# Patient Record
Sex: Female | Born: 1935 | Race: White | Hispanic: No | Smoking: Never smoker
Health system: Southern US, Community
[De-identification: ages and names within clinical notes are randomized; demographics above are authoritative.]

## PROBLEM LIST (undated history)

## (undated) DIAGNOSIS — I10 Essential (primary) hypertension: Secondary | ICD-10-CM

## (undated) DIAGNOSIS — I491 Atrial premature depolarization: Secondary | ICD-10-CM

## (undated) DIAGNOSIS — E039 Hypothyroidism, unspecified: Secondary | ICD-10-CM

## (undated) DIAGNOSIS — K219 Gastro-esophageal reflux disease without esophagitis: Secondary | ICD-10-CM

## (undated) DIAGNOSIS — I739 Peripheral vascular disease, unspecified: Secondary | ICD-10-CM

## (undated) DIAGNOSIS — H269 Unspecified cataract: Secondary | ICD-10-CM

## (undated) DIAGNOSIS — F0390 Unspecified dementia without behavioral disturbance: Secondary | ICD-10-CM

## (undated) DIAGNOSIS — I509 Heart failure, unspecified: Secondary | ICD-10-CM

## (undated) DIAGNOSIS — M1990 Unspecified osteoarthritis, unspecified site: Secondary | ICD-10-CM

## (undated) DIAGNOSIS — E079 Disorder of thyroid, unspecified: Secondary | ICD-10-CM

## (undated) DIAGNOSIS — N289 Disorder of kidney and ureter, unspecified: Secondary | ICD-10-CM

## (undated) HISTORY — PX: EYE SURGERY: SHX253

## (undated) HISTORY — PX: ABDOMINAL HYSTERECTOMY: SHX81

## (undated) HISTORY — PX: ESOPHAGEAL DILATION: SHX303

## (undated) HISTORY — DX: Atrial premature depolarization: I49.1

---

## 2000-06-17 ENCOUNTER — Encounter: Payer: Self-pay | Admitting: Cardiology

## 2000-06-17 ENCOUNTER — Encounter: Admission: RE | Admit: 2000-06-17 | Discharge: 2000-06-17 | Payer: Self-pay | Admitting: Cardiology

## 2001-06-23 ENCOUNTER — Encounter: Payer: Self-pay | Admitting: Cardiology

## 2001-06-23 ENCOUNTER — Encounter: Admission: RE | Admit: 2001-06-23 | Discharge: 2001-06-23 | Payer: Self-pay | Admitting: Cardiology

## 2001-10-19 ENCOUNTER — Ambulatory Visit (HOSPITAL_COMMUNITY): Admission: RE | Admit: 2001-10-19 | Discharge: 2001-10-19 | Payer: Self-pay | Admitting: Gastroenterology

## 2001-10-19 ENCOUNTER — Encounter: Payer: Self-pay | Admitting: Gastroenterology

## 2002-12-04 ENCOUNTER — Ambulatory Visit (HOSPITAL_COMMUNITY): Admission: RE | Admit: 2002-12-04 | Discharge: 2002-12-04 | Payer: Self-pay | Admitting: Gastroenterology

## 2002-12-04 ENCOUNTER — Encounter: Payer: Self-pay | Admitting: Gastroenterology

## 2005-11-18 ENCOUNTER — Ambulatory Visit (HOSPITAL_COMMUNITY): Admission: RE | Admit: 2005-11-18 | Discharge: 2005-11-18 | Payer: Self-pay | Admitting: Gastroenterology

## 2008-10-09 ENCOUNTER — Encounter: Admission: RE | Admit: 2008-10-09 | Discharge: 2008-10-09 | Payer: Self-pay | Admitting: Cardiology

## 2008-12-25 ENCOUNTER — Inpatient Hospital Stay (HOSPITAL_COMMUNITY): Admission: EM | Admit: 2008-12-25 | Discharge: 2008-12-31 | Payer: Self-pay | Admitting: Emergency Medicine

## 2008-12-25 ENCOUNTER — Ambulatory Visit: Payer: Self-pay | Admitting: Vascular Surgery

## 2008-12-29 ENCOUNTER — Encounter (INDEPENDENT_AMBULATORY_CARE_PROVIDER_SITE_OTHER): Payer: Self-pay | Admitting: Gastroenterology

## 2009-02-11 ENCOUNTER — Ambulatory Visit: Payer: Self-pay | Admitting: Internal Medicine

## 2009-02-11 ENCOUNTER — Ambulatory Visit: Payer: Self-pay | Admitting: Diagnostic Radiology

## 2009-02-11 ENCOUNTER — Ambulatory Visit (HOSPITAL_BASED_OUTPATIENT_CLINIC_OR_DEPARTMENT_OTHER): Admission: RE | Admit: 2009-02-11 | Discharge: 2009-02-11 | Payer: Self-pay | Admitting: Internal Medicine

## 2009-02-11 DIAGNOSIS — F0390 Unspecified dementia without behavioral disturbance: Secondary | ICD-10-CM

## 2009-02-11 DIAGNOSIS — E039 Hypothyroidism, unspecified: Secondary | ICD-10-CM | POA: Insufficient documentation

## 2009-02-11 DIAGNOSIS — R269 Unspecified abnormalities of gait and mobility: Secondary | ICD-10-CM

## 2009-02-11 DIAGNOSIS — K219 Gastro-esophageal reflux disease without esophagitis: Secondary | ICD-10-CM

## 2009-02-11 DIAGNOSIS — K222 Esophageal obstruction: Secondary | ICD-10-CM | POA: Insufficient documentation

## 2009-02-11 LAB — CONVERTED CEMR LAB
Basophils Absolute: 0 10*3/uL (ref 0.0–0.1)
CO2: 26 meq/L (ref 19–32)
Calcium: 8.7 mg/dL (ref 8.4–10.5)
Creatinine, Ser: 1.2 mg/dL (ref 0.4–1.2)
Eosinophils Absolute: 0 10*3/uL (ref 0.0–0.7)
Glucose, Bld: 87 mg/dL (ref 70–99)
HCT: 31.2 % — ABNORMAL LOW (ref 36.0–46.0)
Hemoglobin: 10.5 g/dL — ABNORMAL LOW (ref 12.0–15.0)
Lymphs Abs: 0.8 10*3/uL (ref 0.7–4.0)
MCHC: 33.7 g/dL (ref 30.0–36.0)
Neutro Abs: 1.8 10*3/uL (ref 1.4–7.7)
RDW: 14.9 % — ABNORMAL HIGH (ref 11.5–14.6)

## 2009-02-12 ENCOUNTER — Encounter: Payer: Self-pay | Admitting: Internal Medicine

## 2009-02-12 ENCOUNTER — Telehealth: Payer: Self-pay | Admitting: Internal Medicine

## 2009-02-20 ENCOUNTER — Telehealth: Payer: Self-pay | Admitting: Internal Medicine

## 2009-02-28 ENCOUNTER — Encounter: Payer: Self-pay | Admitting: Internal Medicine

## 2009-03-05 ENCOUNTER — Ambulatory Visit (HOSPITAL_COMMUNITY): Admission: RE | Admit: 2009-03-05 | Discharge: 2009-03-05 | Payer: Self-pay | Admitting: Unknown Physician Specialty

## 2009-03-11 ENCOUNTER — Telehealth: Payer: Self-pay | Admitting: Internal Medicine

## 2009-03-13 ENCOUNTER — Telehealth: Payer: Self-pay | Admitting: Internal Medicine

## 2009-03-14 ENCOUNTER — Encounter: Payer: Self-pay | Admitting: Internal Medicine

## 2009-03-15 ENCOUNTER — Ambulatory Visit: Payer: Self-pay | Admitting: Internal Medicine

## 2009-03-15 DIAGNOSIS — R32 Unspecified urinary incontinence: Secondary | ICD-10-CM | POA: Insufficient documentation

## 2009-03-15 DIAGNOSIS — D510 Vitamin B12 deficiency anemia due to intrinsic factor deficiency: Secondary | ICD-10-CM

## 2009-03-15 DIAGNOSIS — E538 Deficiency of other specified B group vitamins: Secondary | ICD-10-CM

## 2009-03-20 ENCOUNTER — Encounter: Payer: Self-pay | Admitting: Internal Medicine

## 2009-03-29 ENCOUNTER — Telehealth (INDEPENDENT_AMBULATORY_CARE_PROVIDER_SITE_OTHER): Payer: Self-pay | Admitting: *Deleted

## 2009-03-29 DIAGNOSIS — M48061 Spinal stenosis, lumbar region without neurogenic claudication: Secondary | ICD-10-CM

## 2009-04-08 ENCOUNTER — Encounter: Payer: Self-pay | Admitting: Internal Medicine

## 2009-04-14 ENCOUNTER — Encounter: Payer: Self-pay | Admitting: Internal Medicine

## 2009-04-14 ENCOUNTER — Inpatient Hospital Stay (HOSPITAL_COMMUNITY): Admission: EM | Admit: 2009-04-14 | Discharge: 2009-04-18 | Payer: Self-pay | Admitting: Emergency Medicine

## 2009-04-14 ENCOUNTER — Ambulatory Visit: Payer: Self-pay | Admitting: Internal Medicine

## 2009-04-14 DIAGNOSIS — N189 Chronic kidney disease, unspecified: Secondary | ICD-10-CM

## 2009-04-14 DIAGNOSIS — M810 Age-related osteoporosis without current pathological fracture: Secondary | ICD-10-CM | POA: Insufficient documentation

## 2009-04-14 DIAGNOSIS — N390 Urinary tract infection, site not specified: Secondary | ICD-10-CM | POA: Insufficient documentation

## 2009-04-14 DIAGNOSIS — R1319 Other dysphagia: Secondary | ICD-10-CM

## 2009-04-19 ENCOUNTER — Encounter: Payer: Self-pay | Admitting: Internal Medicine

## 2009-05-03 ENCOUNTER — Encounter: Payer: Self-pay | Admitting: Internal Medicine

## 2009-05-16 ENCOUNTER — Telehealth: Payer: Self-pay | Admitting: Internal Medicine

## 2009-05-23 ENCOUNTER — Encounter: Payer: Self-pay | Admitting: Internal Medicine

## 2009-05-23 ENCOUNTER — Telehealth: Payer: Self-pay | Admitting: Internal Medicine

## 2009-05-30 ENCOUNTER — Telehealth: Payer: Self-pay | Admitting: Internal Medicine

## 2009-05-31 ENCOUNTER — Encounter: Payer: Self-pay | Admitting: Internal Medicine

## 2009-06-03 ENCOUNTER — Telehealth: Payer: Self-pay | Admitting: Internal Medicine

## 2009-06-17 ENCOUNTER — Ambulatory Visit: Payer: Self-pay | Admitting: Internal Medicine

## 2009-06-17 DIAGNOSIS — I10 Essential (primary) hypertension: Secondary | ICD-10-CM

## 2009-06-17 LAB — CONVERTED CEMR LAB
Basophils Absolute: 0 10*3/uL (ref 0.0–0.1)
Basophils Relative: 0 % (ref 0–1)
Calcium: 8.6 mg/dL (ref 8.4–10.5)
Creatinine, Ser: 1.49 mg/dL — ABNORMAL HIGH (ref 0.40–1.20)
Glucose, Bld: 85 mg/dL (ref 70–99)
Hemoglobin: 11.8 g/dL — ABNORMAL LOW (ref 12.0–15.0)
MCHC: 31.4 g/dL (ref 30.0–36.0)
Monocytes Absolute: 0.6 10*3/uL (ref 0.1–1.0)
Neutro Abs: 2.4 10*3/uL (ref 1.7–7.7)
Neutrophils Relative %: 59 % (ref 43–77)
RDW: 15.1 % (ref 11.5–15.5)
Sodium: 140 meq/L (ref 135–145)
TSH: 40.357 microintl units/mL — ABNORMAL HIGH (ref 0.350–4.500)

## 2009-06-18 ENCOUNTER — Telehealth: Payer: Self-pay | Admitting: Internal Medicine

## 2009-06-21 ENCOUNTER — Telehealth: Payer: Self-pay | Admitting: Internal Medicine

## 2009-06-28 ENCOUNTER — Encounter: Payer: Self-pay | Admitting: Internal Medicine

## 2009-06-28 LAB — CONVERTED CEMR LAB: Free T4: 0.59 ng/dL — ABNORMAL LOW (ref 0.80–1.80)

## 2009-06-30 ENCOUNTER — Telehealth: Payer: Self-pay | Admitting: Internal Medicine

## 2009-07-11 ENCOUNTER — Encounter: Payer: Self-pay | Admitting: Internal Medicine

## 2009-07-16 ENCOUNTER — Telehealth: Payer: Self-pay | Admitting: Internal Medicine

## 2009-07-24 ENCOUNTER — Encounter: Payer: Self-pay | Admitting: Internal Medicine

## 2009-07-31 ENCOUNTER — Encounter: Payer: Self-pay | Admitting: Internal Medicine

## 2009-08-01 ENCOUNTER — Telehealth: Payer: Self-pay | Admitting: Internal Medicine

## 2009-08-12 ENCOUNTER — Telehealth: Payer: Self-pay | Admitting: Internal Medicine

## 2009-08-13 ENCOUNTER — Ambulatory Visit: Payer: Self-pay | Admitting: Diagnostic Radiology

## 2009-08-13 ENCOUNTER — Telehealth: Payer: Self-pay | Admitting: Internal Medicine

## 2009-08-13 ENCOUNTER — Ambulatory Visit: Payer: Self-pay | Admitting: Internal Medicine

## 2009-08-13 ENCOUNTER — Ambulatory Visit (HOSPITAL_BASED_OUTPATIENT_CLINIC_OR_DEPARTMENT_OTHER): Admission: RE | Admit: 2009-08-13 | Discharge: 2009-08-13 | Payer: Self-pay | Admitting: Internal Medicine

## 2009-08-13 DIAGNOSIS — R079 Chest pain, unspecified: Secondary | ICD-10-CM

## 2009-08-28 ENCOUNTER — Encounter: Payer: Self-pay | Admitting: Internal Medicine

## 2009-09-26 ENCOUNTER — Telehealth: Payer: Self-pay | Admitting: Internal Medicine

## 2009-10-04 ENCOUNTER — Encounter: Payer: Self-pay | Admitting: Internal Medicine

## 2009-12-10 ENCOUNTER — Encounter: Payer: Self-pay | Admitting: Internal Medicine

## 2010-04-14 ENCOUNTER — Encounter: Payer: Self-pay | Admitting: Internal Medicine

## 2010-08-22 ENCOUNTER — Encounter: Admission: RE | Admit: 2010-08-22 | Discharge: 2010-08-22 | Payer: Self-pay | Admitting: Cardiology

## 2010-08-22 ENCOUNTER — Ambulatory Visit: Payer: Self-pay | Admitting: Cardiology

## 2010-10-16 ENCOUNTER — Other Ambulatory Visit (HOSPITAL_COMMUNITY): Payer: Self-pay | Admitting: Internal Medicine

## 2010-10-16 DIAGNOSIS — I10 Essential (primary) hypertension: Secondary | ICD-10-CM

## 2010-10-21 NOTE — Progress Notes (Signed)
Summary: Medical Care Question   Phone Note Call from Patient   Caller: Son Call For: D. Thomos Lemons DO Details for Reason: Medical Care Question- Summary of Call: Son- Emily Peters called to ask if we will still see his mother as a pt. now that she has medicaid? Call Mr. Emily Peters back at [phone] She is already a pt. of Dr.Avalene Sealy and wants to continue her care here.  Initial call taken by: Michaelle Copas,  September 26, 2009 11:25 AM  Follow-up for Phone Call        yes, she can continue as pt Follow-up by: D. Thomos Lemons DO,  September 26, 2009 12:09 PM  Additional Follow-up for Phone Call Additional follow up Details #1::        informed pt's son- Emily Peters that pt. can continue seing Dr.Avannah Decker as her PCP Additional Follow-up by: Michaelle Copas,  September 26, 2009 12:14 PM

## 2010-10-21 NOTE — Miscellaneous (Signed)
Summary: Care Plan/[facility]  Care Plan/[facility]   Imported By: Lanelle Bal 10/09/2009 11:44:33  _____________________________________________________________________  External Attachment:    Type:   Image     Comment:   External Document

## 2010-10-21 NOTE — Miscellaneous (Signed)
Summary: SN Orders/[facility]  SN Orders/[facility]   Imported By: Lanelle Bal 04/17/2010 11:33:59  _____________________________________________________________________  External Attachment:    Type:   Image     Comment:   External Document

## 2010-10-21 NOTE — Miscellaneous (Signed)
Summary: Care Plan/[facility]  Care Plan/[facility]   Imported By: Lanelle Bal 12/13/2009 10:35:51  _____________________________________________________________________  External Attachment:    Type:   Image     Comment:   External Document

## 2010-11-07 ENCOUNTER — Ambulatory Visit (HOSPITAL_COMMUNITY)
Admission: RE | Admit: 2010-11-07 | Discharge: 2010-11-07 | Disposition: A | Discharge status: Home / Self Care | Payer: Medicaid Other | Source: Ambulatory Visit | Attending: Internal Medicine | Admitting: Internal Medicine

## 2010-11-07 ENCOUNTER — Other Ambulatory Visit (HOSPITAL_COMMUNITY): Payer: Self-pay

## 2010-11-07 DIAGNOSIS — I10 Essential (primary) hypertension: Secondary | ICD-10-CM

## 2010-11-07 DIAGNOSIS — Q619 Cystic kidney disease, unspecified: Secondary | ICD-10-CM | POA: Insufficient documentation

## 2010-12-28 LAB — BASIC METABOLIC PANEL
BUN: 23 mg/dL (ref 6–23)
BUN: 27 mg/dL — ABNORMAL HIGH (ref 6–23)
CO2: 20 mEq/L (ref 19–32)
CO2: 22 mEq/L (ref 19–32)
CO2: 22 mEq/L (ref 19–32)
Calcium: 7.6 mg/dL — ABNORMAL LOW (ref 8.4–10.5)
Calcium: 7.9 mg/dL — ABNORMAL LOW (ref 8.4–10.5)
Calcium: 8.1 mg/dL — ABNORMAL LOW (ref 8.4–10.5)
Chloride: 109 mEq/L (ref 96–112)
Creatinine, Ser: 1.59 mg/dL — ABNORMAL HIGH (ref 0.4–1.2)
Creatinine, Ser: 1.84 mg/dL — ABNORMAL HIGH (ref 0.4–1.2)
Creatinine, Ser: 2.04 mg/dL — ABNORMAL HIGH (ref 0.4–1.2)
GFR calc Af Amer: 28 mL/min — ABNORMAL LOW (ref >60)
GFR calc non Af Amer: 31 mL/min — ABNORMAL LOW (ref >60)
Glucose, Bld: 103 mg/dL — ABNORMAL HIGH (ref 70–99)
Glucose, Bld: 128 mg/dL — ABNORMAL HIGH (ref 70–99)
Glucose, Bld: 90 mg/dL (ref 70–99)
Glucose, Bld: 99 mg/dL (ref 70–99)
Sodium: 135 mEq/L (ref 135–145)
Sodium: 136 mEq/L (ref 135–145)

## 2010-12-28 LAB — COMPREHENSIVE METABOLIC PANEL
ALT: 10 U/L (ref 0–35)
AST: 27 U/L (ref 0–37)
Albumin: 2.6 g/dL — ABNORMAL LOW (ref 3.5–5.2)
Alkaline Phosphatase: 65 U/L (ref 39–117)
BUN: 31 mg/dL — ABNORMAL HIGH (ref 6–23)
CO2: 25 mEq/L (ref 19–32)
Calcium: 8.8 mg/dL (ref 8.4–10.5)
Glucose, Bld: 101 mg/dL — ABNORMAL HIGH (ref 70–99)
Total Bilirubin: 0.8 mg/dL (ref 0.3–1.2)
Total Protein: 8.4 g/dL — ABNORMAL HIGH (ref 6.0–8.3)

## 2010-12-28 LAB — CBC
HCT: 38.3 % (ref 36.0–46.0)
Hemoglobin: 10.5 g/dL — ABNORMAL LOW (ref 12.0–15.0)
Hemoglobin: 11 g/dL — ABNORMAL LOW (ref 12.0–15.0)
Hemoglobin: 12.8 g/dL (ref 12.0–15.0)
MCHC: 33.4 g/dL (ref 30.0–36.0)
MCHC: 33.8 g/dL (ref 30.0–36.0)
MCHC: 34.4 g/dL (ref 30.0–36.0)
MCV: 95.8 fL (ref 78.0–100.0)
MCV: 97 fL (ref 78.0–100.0)
RBC: 3.39 MIL/uL — ABNORMAL LOW (ref 3.87–5.11)
RDW: 14.4 % (ref 11.5–15.5)
RDW: 14.8 % (ref 11.5–15.5)

## 2010-12-28 LAB — URINALYSIS, ROUTINE W REFLEX MICROSCOPIC
Bilirubin Urine: NEGATIVE
Glucose, UA: NEGATIVE mg/dL
Specific Gravity, Urine: 1.014 (ref 1.005–1.030)
Urobilinogen, UA: 0.2 mg/dL (ref 0.0–1.0)
pH: 5.5 (ref 5.0–8.0)

## 2010-12-28 LAB — DIFFERENTIAL
Basophils Absolute: 0 10*3/uL (ref 0.0–0.1)
Eosinophils Absolute: 0 10*3/uL (ref 0.0–0.7)
Eosinophils Relative: 0 % (ref 0–5)
Lymphocytes Relative: 8 % — ABNORMAL LOW (ref 12–46)
Monocytes Absolute: 0.6 10*3/uL (ref 0.1–1.0)

## 2010-12-28 LAB — CULTURE, BLOOD (ROUTINE X 2): Culture: NO GROWTH

## 2010-12-28 LAB — URINE MICROSCOPIC-ADD ON

## 2010-12-28 LAB — URINE CULTURE

## 2010-12-31 LAB — BASIC METABOLIC PANEL
BUN: 10 mg/dL (ref 6–23)
BUN: 21 mg/dL (ref 6–23)
BUN: 30 mg/dL — ABNORMAL HIGH (ref 6–23)
CO2: 22 mEq/L (ref 19–32)
CO2: 25 mEq/L (ref 19–32)
Calcium: 8 mg/dL — ABNORMAL LOW (ref 8.4–10.5)
Calcium: 8.3 mg/dL — ABNORMAL LOW (ref 8.4–10.5)
Chloride: 103 mEq/L (ref 96–112)
Chloride: 106 mEq/L (ref 96–112)
GFR calc Af Amer: 40 mL/min — ABNORMAL LOW (ref >60)
GFR calc non Af Amer: 33 mL/min — ABNORMAL LOW (ref >60)
GFR calc non Af Amer: 38 mL/min — ABNORMAL LOW (ref >60)
Glucose, Bld: 91 mg/dL (ref 70–99)
Glucose, Bld: 93 mg/dL (ref 70–99)
Glucose, Bld: 94 mg/dL (ref 70–99)
Potassium: 4.1 mEq/L (ref 3.5–5.1)
Potassium: 4.6 mEq/L (ref 3.5–5.1)
Sodium: 135 mEq/L (ref 135–145)
Sodium: 135 mEq/L (ref 135–145)
Sodium: 137 mEq/L (ref 135–145)

## 2010-12-31 LAB — CBC
HCT: 28.7 % — ABNORMAL LOW (ref 36.0–46.0)
HCT: 31.9 % — ABNORMAL LOW (ref 36.0–46.0)
HCT: 35 % — ABNORMAL LOW (ref 36.0–46.0)
Hemoglobin: 11 g/dL — ABNORMAL LOW (ref 12.0–15.0)
Hemoglobin: 11.9 g/dL — ABNORMAL LOW (ref 12.0–15.0)
MCHC: 34.6 g/dL (ref 30.0–36.0)
MCV: 95.9 fL (ref 78.0–100.0)
Platelets: 184 10*3/uL (ref 150–400)
RBC: 3.63 MIL/uL — ABNORMAL LOW (ref 3.87–5.11)
RDW: 14.1 % (ref 11.5–15.5)
RDW: 14.6 % (ref 11.5–15.5)
WBC: 5.8 10*3/uL (ref 4.0–10.5)

## 2010-12-31 LAB — URINALYSIS, ROUTINE W REFLEX MICROSCOPIC
Bilirubin Urine: NEGATIVE
Ketones, ur: NEGATIVE mg/dL
Nitrite: POSITIVE — AB
Specific Gravity, Urine: 1.018 (ref 1.005–1.030)
Urobilinogen, UA: 1 mg/dL (ref 0.0–1.0)

## 2010-12-31 LAB — T4, FREE: Free T4: 1.26 ng/dL (ref 0.89–1.80)

## 2010-12-31 LAB — URINE CULTURE

## 2010-12-31 LAB — COMPREHENSIVE METABOLIC PANEL
Alkaline Phosphatase: 88 U/L (ref 39–117)
BUN: 44 mg/dL — ABNORMAL HIGH (ref 6–23)
CO2: 26 mEq/L (ref 19–32)
Chloride: 97 mEq/L (ref 96–112)
Glucose, Bld: 113 mg/dL — ABNORMAL HIGH (ref 70–99)
Potassium: 3.4 mEq/L — ABNORMAL LOW (ref 3.5–5.1)
Total Bilirubin: 0.6 mg/dL (ref 0.3–1.2)

## 2010-12-31 LAB — CARDIAC PANEL(CRET KIN+CKTOT+MB+TROPI)
CK, MB: 1.2 ng/mL (ref 0.3–4.0)
Relative Index: INVALID (ref 0.0–2.5)
Total CK: 96 U/L (ref 7–177)
Troponin I: 0.02 ng/mL (ref 0.00–0.06)

## 2010-12-31 LAB — LIPID PANEL
Cholesterol: 72 mg/dL (ref 0–200)
LDL Cholesterol: 47 mg/dL (ref 0–99)
VLDL: 11 mg/dL (ref 0–40)

## 2010-12-31 LAB — DIFFERENTIAL
Basophils Absolute: 0 10*3/uL (ref 0.0–0.1)
Basophils Relative: 0 % (ref 0–1)
Monocytes Absolute: 0.9 10*3/uL (ref 0.1–1.0)
Neutro Abs: 4 10*3/uL (ref 1.7–7.7)
Neutrophils Relative %: 69 % (ref 43–77)

## 2010-12-31 LAB — URINE MICROSCOPIC-ADD ON

## 2010-12-31 LAB — CK TOTAL AND CKMB (NOT AT ARMC)
CK, MB: 1.5 ng/mL (ref 0.3–4.0)
Relative Index: 1.2 (ref 0.0–2.5)

## 2010-12-31 LAB — TSH: TSH: 0.259 u[IU]/mL — ABNORMAL LOW (ref 0.350–4.500)

## 2010-12-31 LAB — HOMOCYSTEINE: Homocysteine: 18.4 umol/L — ABNORMAL HIGH (ref 4.0–15.4)

## 2011-02-03 NOTE — Op Note (Signed)
NAMELASHAY, Emily Peters                  ACCOUNT NO.:  [id number]   MEDICAL RECORD NO.:  [id number]          PATIENT TYPE:  INP   LOCATION:                               FACILITY:  [facility]   PHYSICIAN:  Shirley Friar, MDDATE OF BIRTH:  July 14, 1915   DATE OF PROCEDURE:  DATE OF DISCHARGE:                               OPERATIVE REPORT   INDICATIONS:  Dysphagia, history of esophageal stricture, abnormal  barium swallow.   MEDICATIONS:  1. Fentanyl 25 mcg IV.  2. Versed 2 mg IV.   FINDINGS:  Endoscope was inserted through the oropharynx and esophagus  was intubated.  The proximal and mid esophagus was normal in appearance.  The distal esophagus revealed a short tight stricture at the GE  junction.  The endoscope was advanced through this stricture with mild-  to-moderate resistance down into the stomach.  A medium-sized hiatal  hernia was noted.  The endoscope was advanced down to the duodenal bulb  which was very edematous in its distal portion which caused difficulty  with advancing the endoscope into the second portion of the duodenum.  After successful advancement into the second portion of the duodenum,  the endoscope was withdrawn back into the distal part of the duodenal  bulb and biopsies were taken for histology.  This area was edematous and  nodular.  No definite mass was noted.  Endoscope was withdrawn back into  the stomach and retroflexion again confirmed a hiatal hernia.  Endoscope  was withdrawn back into the esophagus.  A decision was made to use a  pneumatic balloon dilation 5.5 cm in length in stepwise fashion starting  at 12 mm, followed by 13.5 mm, followed by 15 mm.  Each balloon was held  for 1 minute with successful dilation of the esophageal stricture.   ASSESSMENT:  1. Distal esophageal stricture status post balloon dilation up to 15      mm.  2. Hiatal hernia.  3. Edematous distal duodenal bulb status post biopsies.   PLAN:  1. Followup on path.  2.  Liquid diet and advance as tolerated.  3. IV PPI therapy q.12 hours until tolerating solid diet.      Shirley Friar, MD  Electronically Signed     VCS/MEDQ  D:  12/29/2008  T:  12/30/2008  Job:  [id number]   cc:   Fayrene Fearing L. Malon Kindle., M.D.

## 2011-02-03 NOTE — Discharge Summary (Signed)
Emily Peters, Emily Peters                  ACCOUNT NO.:  [id number]   MEDICAL RECORD NO.:  [id number]          PATIENT TYPE:  INP   LOCATION:  6708                         FACILITY:  [facility]   PHYSICIAN:  Altha Harm, MDDATE OF BIRTH:  03/05/1915   DATE OF ADMISSION:  12/24/2008  DATE OF DISCHARGE:  12/31/2008                               DISCHARGE SUMMARY   DISCHARGE DISPOSITION:  [facility].   FINAL DISCHARGE DIAGNOSES:  1. Pseudomonas urinary tract infection.  2. Esophageal stricture.  3. Altered mental status changes.  4. Dysphagia.  5. Hypothyroidism.  6. Generalized weakness.  7. Gastroesophageal reflux disease.  8. Mild dehydration.  9. Mild hyponatremia, resolved.   DISCHARGE MEDICATIONS:  Include the following:  1. Ciprofloxacin 500 mg p.o. b.i.d. x7 days.  2. Protonix 40 mg p.o. daily.  3. Synthroid 100 mcg p.o. daily.  4. Aspirin 81 mg p.o. daily.  5. Ambien 5 mg p.o. q.h.s. p.r.n.   CONSULTANTS:  [facility].   PROCEDURES:  Dilation of esophageal stricture.   DIAGNOSTIC STUDIES:  1. X-ray of the right tibia and fibula which shows:      a.     Acute bony abnormalities.      b.     Bony demineralization.  2. V/Q scan done on admission which shows low likelihood of pulmonary      embolism.  3. A 2-view chest x-ray done on December 26, 2008, which shows lungs      clear.  4. Barium swallow esophagram which shows:      a.     Persistent narrowing at the gastroesophageal junction noted.       Attempted ingestion of a 12.5-mm barium tablet to quantify the       degree of narrowing is unsuccessful.      b.     Persistent fairly diffuse pressure contraction of the       esophagus compatible with presbyesophagus.      c.     Small hiatal hernia.      d.     Possible cholelithiasis.   ALLERGIES:  NO KNOWN DRUG ALLERGIES.   CODE STATUS:  Full code.   PRIMARY CARE PHYSICIAN:  Dr. Patty Sermons.   CHIEF COMPLAINT:  Progressive weakness  and anorexia.   HISTORY OF PRESENT ILLNESS:  Please refer to the H and P dictated by Dr.  Orvan Falconer for details of the HPI.   HOSPITAL COURSE:  The patient was admitted with a presumptive diagnosis  of urinary tract infection.  She was also mildly dehydrated.  She was  given IV fluid resuscitation and the patient improved her mental status  considerably.  The patient initially had a mild hyponatremia associated  with her dehydration and mild increase in her creatinine.  After  hydration, her hyponatremia resolved and her renal insufficiency  improved.  The patient was placed on IV Rocephin on admission for  empiric treatment of her urinary tract infection.  Her urine  subsequently grew out 2000 colonies of Pseudomonas.  Considering that  the patient did appear to be  symptomatic, she will be treated with Cipro  for 7 days.   In addition to this, the patient was noted to have some difficulty  swallowing and after speaking with the patient's family historically it  was noted that the patient had been taking in only liquids.  Given her  prior history of esophageal stricture, the patient was evaluated for  recurrent stricture and was found to have such.  Gastroenterology was  consulted and the patient underwent an esophageal dilation.  The patient  was then resumed on a clear liquid diet and then advanced to a soft  mechanical diet which she has been tolerating without any difficulty.  Otherwise, the patient has remained stable.   MEDICATIONS:  As noted above.   In terms of her dietary restrictions, the patient should be on a soft  mechanical diet with aspiration precautions and she should be on a no-  added salt diet.   PHYSICAL RESTRICTIONS:  Activity as tolerated with physical therapy.   FOLLOWUP:  The patient should follow up with her primary care physician  within 3 to 5 days of discharge.   TOTAL TIME FOR DISCHARGE:  35 minutes.      Altha Harm, MD  Electronically  Signed     MAM/MEDQ  D:  12/31/2008  T:  12/31/2008  Job:  [id number]   cc:   Cassell Clement, M.D.

## 2011-02-03 NOTE — Discharge Summary (Signed)
Emily Peters, Emily Peters                  ACCOUNT NO.:  [id number]   MEDICAL RECORD NO.:  [id number]          PATIENT TYPE:  INP   LOCATION:  5123                         FACILITY:  [facility]   PHYSICIAN:  Lonia Blood, M.D.       DATE OF BIRTH:  01/02/1915   DATE OF ADMISSION:  04/14/2009  DATE OF DISCHARGE:  04/18/2009                               DISCHARGE SUMMARY   PRIMARY CARE PHYSICIAN:  Dr. Thomos Lemons with [facility].   DISCHARGE DIAGNOSES:  1. Escherichia coli urinary tract infection with bacteremia.  2. Acute on chronic renal failure.  3. Dehydration, resolved.  4. Chronic kidney disease, stage III.  5. Hypertension.  6. Dementia, mild to moderate.  7. Workup in progress for normal pressure hydrocephalus, favor senile      dementia.  8. Gastroesophageal reflux disease.  9. Recent esophageal stricture, status post dilatation.  10.Presbyesophagus.  11.B12 deficiency.  12.Osteoporosis.  13.Hypothyroidism.   DISCHARGE MEDICATIONS:  1. Aspirin 81 mg daily.  2. Synthroid 100 mcg daily.  3. Omeprazole 20 mg daily.  4. Vitamin D 1000 daily.  5. Senokot 1 tablet at bedtime.  6. Aricept 10 mg at bedtime.  7. Vitamin B12 at 1000 mcg injectable once a week.  8. Septra single-strength 1 tablet by mouth daily until April 20, 2009.  9. Norvasc 10 mg daily.  10.Robitussin-DM 5 mL by mouth every 8 hours as needed for cough.   CONDITION ON DISCHARGE:  [name] is transferred to a skilled nursing  facility for short-term rehabilitation.  She will follow up with Dr. Artist Pais  post discharge to complete the workup for possible normal pressure  hydrocephalus.   PROCEDURE DURING THIS ADMISSION:  The patient underwent a renal  ultrasound on April 15, 2009, with findings of bilateral simple renal  cysts, no hydronephrosis, right kidney measuring 12 cm and left kidney  measuring 9 cm.   CONSULTATIONS ON ADMISSION:  No consultations obtained.   HISTORY AND PHYSICAL:  There is a complete  typed history and physical in  the chart by Dr. Oliver Barre, but briefly [name] a 77 year old woman  with dementia presented to the clinic for increased weakness.  She was  also found to have a urinary tract infection and acute on chronic renal  failure and was admitted for further workup and observation.   HOSPITAL COURSE:  [name] was admitted on a non-telemetry floor and was  started on intravenous fluids and empiric ciprofloxacin.  Based on her  previous cultures there was a suspicion of Pseudomonas, so the patient  was switched to Zosyn on April 16, 2009.  She continued to improve with  resolution of the acute renal failure and return of the creatinine to  baseline.  On April 17 1009, the results of the culture were available  and the patient was growing Escherichia coli pansensitive in the urine  and the blood.  [name] remained afebrile for 72 hours prior to  discharge and she was also afebrile 24 prior to discharge on oral  Septra.  The patient's renal function  recovered to a baseline creatinine  of 1.6, and she has been eating well with assistance.  The patient is to  transfer to a skilled nursing facility for short-term rehabilitation and  to continue to follow-up with her primary care physician for her  dementia and vitamin B12 deficiency.      Lonia Blood, M.D.  Electronically Signed     SL/MEDQ  D:  04/18/2009  T:  04/18/2009  Job:  [id number]   cc:   Barbette Hair. Artist Pais, DO

## 2011-02-03 NOTE — H&P (Signed)
Emily, Peters                  ACCOUNT NO.:  [id number]   MEDICAL RECORD NO.:  [id number]          PATIENT TYPE:  INP   LOCATION:  6708                         FACILITY:  [facility]   PHYSICIAN:  Vania Rea, M.D. DATE OF BIRTH:  [birth year]   DATE OF ADMISSION:  12/24/2008  DATE OF DISCHARGE:                              HISTORY & PHYSICAL   PRIMARY CARE PHYSICIAN:  Dr. Patty Sermons.   CARDIOLOGIST:  Cassell Clement, M.D.   CHIEF COMPLAINT:  Progressive weakness and anorexia.   HISTORY OF PRESENT ILLNESS:  This is a 77 year old Caucasian lady who  lives mostly alone but her grandson is with her often.  Was brought to  the emergency room today because of progressive weakness, decreased  appetite.  Emily Peters does mostly drinking.  The patient complains of  progressively worsening memory.  The only reason Emily Peters knows why Emily Peters is  here is because of having to have tests done.  Otherwise Emily Peters is unable  to contribute much to the history.  The patient is also very hard of  hearing.  Her grandson was contacted by phone and was also unable to  give much information about the patient's history but he does report  that he stays with the patient sometimes as Emily Peters has been getting  progressively more weak.  Emily Peters has frequent episodes of incontinence of  stool and urine and sometimes has difficulty using the bathroom.  There  is no history of fever, cough or cold, chest pains or shortness of  breath.  Emily Peters has been having no syncope or dizziness.  He does not know  the status of her kidney function.  He has never heard of her having  kidney failure.  He does know that Emily Peters has a thyroid problem but is  unsure of her medications.  Emily Peters does not have home health but Emily Peters does  have a lady who comes in twice per week to help to bathe her.   PAST MEDICAL HISTORY:  1. History of hypothyroidism.  2. History of acid reflux.  3. History of esophageal strictures with recurrent dilations.   MEDICATIONS:  Unable  to obtain.  The ER chart lists Lasix and Prilosec.  Her grandson says Emily Peters has a thyroid problem.   ALLERGIES:  No known drug allergies.   SOCIAL HISTORY:  No history of tobacco, alcohol or illicit drug use.  Otherwise unable to obtain.   FAMILY HISTORY:  Unable to obtain even from the grandson.   REVIEW OF SYSTEMS:  Other than noted above, unable to obtain.   PHYSICAL EXAMINATION:  Elderly Caucasian lady lying in bed, severely  hard of hearing but Emily Peters is aware that Emily Peters is in hospital to have some  tests done but is unable to describe any specific symptoms.  VITAL SIGNS:  Her temperature is 97.8, her pulse is 63, respirations 10,  blood pressure 136/66.  Emily Peters is saturating at 98% on room air.  Her pupils are round and equal.  Mucous membrane is pink, anicteric.  No cervical lymphadenopathy or thyromegaly.  No jugular venous  distention.  CHEST:  Clear to auscultation bilaterally.  CARDIOVASCULAR:  Regular rhythm, no murmur heard.  ABDOMEN:  Soft and nontender.  No masses felt.  EXTREMITIES:  Without edema.  Emily Peters has 1+ dorsalis pedis pulses  bilaterally.  CENTRAL NERVOUS SYSTEM:  Cranial nerves II-XII are grossly intact.  Emily Peters  has no focal neurologic deficit.   LABORATORY DATA:  Her CBC is reviewed.  Her white count is 5.8,  hemoglobin 11.9, platelets 209, otherwise unremarkable.  Serum  chemistry:  Her sodium is 134, potassium 3.4, chloride 97, CO2 26, BUN  44, creatinine 2.11, calcium 8.4, total protein 8.0, albumin 2.2, is  otherwise unremarkable.  Her urinalysis is significant for a specific  gravity of 1.018, positive for nitrite, small amount of leukocyte  esterase, 30 protein.  Microscopy reveals rare epithelial cells and 0-2  white cells, 0-2 red cells, many bacteria, otherwise unremarkable.   X-rays were done of her right tibia and fibula since Emily Peters apparently at  some time was complaining of right calf pain.  No soft tissue  abnormalities were noted.  EKG shows normal  sinus rhythm, no ST  abnormality.  Emily Peters does have a left anterior fascicular block.   ASSESSMENT:  1. Renal failure, acute versus chronic.  2. Hyponatremia and hypokalemia, possible side effects of Lasix and      dehydration.  3. Anorexia possibly associated with advanced age.  4. History of right calf pain and weakness.  5. History of hypothyroidism.   PLAN:  We will follow up on TSH.  We will hydrate and replete  electrolytes and we will need to have a discussion with her son rather  than her grandson as to whether this lady can continue living at home or  whether Emily Peters may need placement for 24-hour care.  We will culture her  urine; however, this seems to be asymptomatic hematuria rather than a  frank urinary tract infection.  We will attempt also to get further  information about this lady's health from her primary physician, Dr.  Patty Sermons.      Vania Rea, M.D.  Electronically Signed     LC/MEDQ  D:  12/25/2008  T:  12/25/2008  Job:  [id number]   cc:   Cassell Clement, M.D.

## 2011-02-03 NOTE — Consult Note (Signed)
Emily Peters, Emily Peters                  ACCOUNT NO.:  [id number]   MEDICAL RECORD NO.:  [id number]          PATIENT TYPE:  INP   LOCATION:  6708                         FACILITY:  [facility]   PHYSICIAN:  James L. Randa Evens, M.D. DATE OF BIRTH:  10-20-1914   DATE OF CONSULTATION:  12/28/2008  DATE OF DISCHARGE:                                 CONSULTATION   We were asked to see Emily Peters today in consultation for esophageal  stricture and dysphagia.   HISTORY OF PRESENT ILLNESS:  This is a 77 year old female who was  admitted on December 24, 2008, with acute renal failure, found to have a  UTI.  She was dehydrated at the time, not eating well at all.  She  described pain in her abdomen and tightness that is now improved with  resolution of her UTI.  Her anorexia has also improved and she is  hungry.  She is tolerating a full liquid diet well.  She had a barium  swallow done yesterday that showed narrowing of the GE junction.  She  requests that Dr. Randa Evens stretch her esophagus.  The family asks for  Dr. Randa Evens specifically and that the patient is mildly demented and  they said that that would be helpful to her to have Dr. Randa Evens be the  one to stretch her esophagus.  Her primary care physician is Dr. Cassell Clement.  Her gastroenterologist is Dr. Carman Ching.   Past medical history is significant for esophageal stricture with  multiple Savary dilations by Dr. Randa Evens, her last one being in February  2007.  She was dilated to 36-French.  She also has a history of  hypothyroidism, acid reflux.  She is very hard-of-hearing.   Current medications include Lasix and Prilosec.   She has no known drug allergies.   Review of systems was negative other than HPI.   Social history is negative for drugs, alcohol, and tobacco.   Family history is unobtainable from the family or the patient.   PHYSICAL EXAMINATION:  GENERAL:  She is alert and oriented, eating.  She  is hard-of-hearing.  VITAL  SIGNS:  Temperature 98, pulse 61, respirations 18, blood pressure  is 168/67.  HEART:  Regular rate and rhythm with subtle murmur.  LUNGS:  No wheezes or crackles.  She has no labored breathing.  ABDOMEN:  Soft, nontender, nondistended with good bowel sounds.   Labs show a hemoglobin of 9.7, hematocrit 28.7, white count 4.1,  platelets 184,000, BUN 21, creatinine 1.47.  Radiological exams include  a barium swallow that shows narrowing of the GE junction.  She is unable  to ingest a 12.5-mm barium tablet.  Also shows persistent diffuse  tertiary contractions consistent with presbyesophagus.   ASSESSMENT:  Dr. Carman Ching has seen and examined the patient,  collected history, and reviewed her chart.  Impression is this is a 3-  year-old female with esophageal stricture.  Plan for esophageal dilation  to be scheduled in the interim.   Thanks very much for this consultation.      Stephani Police, PA  ______________________________  Llana Aliment Randa Evens, M.D.    MLY/MEDQ  D:  12/28/2008  T:  12/29/2008  Job:  [id number]   cc:   Cassell Clement, M.D.

## 2011-02-06 NOTE — Op Note (Signed)
   NAMESHAQUITA, Emily Peters                            ACCOUNT NO.:  [id number]   MEDICAL RECORD NO.:  [id number]                   PATIENT TYPE:  AMB   LOCATION:  ENDO                                 [facility]ACILITY:  [facility]   PHYSICIAN:  James L. Malon Kindle., M.D.          DATE OF BIRTH:  [birth year]   DATE OF PROCEDURE:  12/04/2002  DATE OF DISCHARGE:                                 OPERATIVE REPORT   PROCEDURE:  Esophagoscopy with Savary dilatation.   MEDICATIONS:  Fentanyl 50 mcg, Versed 4 mg IV.   INDICATIONS FOR PROCEDURE:  The patient has a previous history of esophageal  stricture.  January 2003, been over a year ago, she was dilated to 35  [place], did well for about six months.  Then she had progressive dysphagia,  vomiting, regurgitation of food.  She has continued to have symptoms that  are progressive.   DESCRIPTION OF PROCEDURE:  The procedure had been explained to the patient  and consent obtained.  The patient was seen in the office.  Risks and  benefits were discussed with her again there.  In the left lateral decubitus  position, fluoroscopy unit.  The Olympus scope was inserted and passed.  We  advanced in the distal esophagus.  There was a stricture; the scope would  not pass.  Using fluoroscopic guidance, a Savary guidewire was placed  through the scope and passed out the greater curve of the stomach.  The  scope was then withdrawn over the guidewire, taking care not to move the  guidewire.  The patient's neck was fully extended.  We then passed a 24, 27,  30, 33, 36, and 39 Savary dilator.  A small amount of blood was obtained  with the 39 French dilator, and this was removed with the guidewire.  The  scope was withdrawn.  The patient s resting comfortably.  There were no  immediate complications.   ASSESSMENT:  Esophageal stricture dilated to 27 [place].   PLAN:  1. Routine postdilatation orders.  2. Clear liquids for 6-12 hours with slow advancement of diet with  instructions to call for any pain, shortness of breath, or bleeding.                                               James L. Malon Kindle., M.D.    Waldron Session  D:  12/04/2002  T:  12/04/2002  Job:  [id number]

## 2011-02-06 NOTE — Op Note (Signed)
Emily Peters, Emily Peters                  ACCOUNT NO.:  [id number]   MEDICAL RECORD NO.:  [id number]          PATIENT TYPE:  AMB   LOCATION:  ENDO                         FACILITY:  [facility]   PHYSICIAN:  James L. Malon Kindle., M.D.DATE OF BIRTH:  01-31-1915   DATE OF PROCEDURE:  11/18/2005  DATE OF DISCHARGE:                                 OPERATIVE REPORT   PROCEDURE PERFORMED:  Esophagogastroduodenoscopy with Savary dilatation.   MEDICATIONS:  Fentanyl 20 mcg, Versed 2 mg IV.   ENDOSCOPIST:  Llana Aliment. Randa Evens, M.D.   INDICATIONS FOR PROCEDURE:  The patient has had a previous history of  esophageal stricture that was dilated three years ago to 61 [place].  Has  done pretty well up until recently.   DESCRIPTION OF PROCEDURE:  The procedure had been explained to the patient  and consent obtained.  In the past, the patient only had esophagoscopy.  We  could not get through her stricture.  For this reason, we used fluoroscopic  control.  The pediatric scope was used which was not available the last time  we did her.  This was easily passed into the esophagus with agglutination  and advanced.  The stricture was seen.  The pediatric scope readily passed  it.  Complete endoscopy was performed and the duodenal bulb, second  duodenum, pyloric channel, antrum and body were normal.  Fundus and cardia  were seen well on retroflex view and were normal.  There was a small hiatal  hernia 2 to 3 cm.  Using fluoroscopic guidance, the Savary guidewire through  the scope and the scope was withdrawn over the guidewire.  The patient's  neck was then extended and we passed a 27, 30, 33 and 36 dilator  sequentially.  There was a small amount of heme on the 36 [place] dilator.  The scope was withdrawn.  The patient tolerated the procedure well.   ASSESSMENT:  1.  Esophageal stricture dilated to 36 [place].   PLAN:  Routine post dilatation orders.  Clear liquids for four hours, call  for pain, trouble breathing,  etc.  Will have her start Pepcid 20 mg twice  daily which she will obtain over-the-counter.  Will see her back in the  office in approximately two months.           ______________________________  Llana Aliment. Malon Kindle., M.D.     Waldron Session  D:  11/18/2005  T:  11/18/2005  Job:  [id number]   cc:   Cassell Clement, M.D.  Fax: [phone]

## 2011-02-06 NOTE — Procedures (Signed)
[facility]  Patient:    Emily Peters, Emily Peters Visit Number: [id number] MRN: [id number]          Service Type: END Location: ENDO Attending Physician:  Orland Mustard Dictated by:   Llana Aliment. Randa Evens, M.D. Proc. Date: 10/19/01 Admit Date:  10/19/2001                             Procedure Report  DATE OF BIRTH:  June 10, 1915  PROCEDURE:  Esophagoscopy with Savary dilatation.  MEDICATIONS:  Cetacaine spray, fentanyl 50 mcg, Versed 3.5 mg IV.  INDICATIONS FOR PROCEDURE:  Esophageal stricture and dysphagia with stricture demonstrated by barium swallow to be quite tight and not allow passage of a 12 mm tablet.  DESCRIPTION OF PROCEDURE:  The procedure had been explained to the patient and consent obtained. The patient in the left lateral decubitus position, the Olympus videoscope was inserted. The distal esophagus was reached and there was a stricture that was would not allow passage of the scope. It was documented photographically. Using fluoroscopic guidance, the Savary guidewire was placed through the stricture and the scope withdrawn using guidance over the guidewire with the guidewire maintained along the greater curve of the stomach. The patients neck extended, we then passed a 21 French dilator and passed sequential dilators up to 33 [place]. There was a small amount of heme with a 33 [place]. The guidewire and a 76 French dilator withdrawn as a unit. The patient tolerated the procedure well and there were no immediate complications.  ASSESSMENT:  Esophageal stricture dilated to a 66 [place].  PLAN:  Routine post dilatation instruction. She will call and report in two weeks and see her back in the office in two months. We will also renew her Aciphex. Dictated by:   Llana Aliment. Randa Evens, M.D. Attending Physician:  Orland Mustard DD:  10/19/01 TD:  10/19/01 Job: [id number]

## 2012-01-29 ENCOUNTER — Encounter: Payer: Self-pay | Admitting: *Deleted

## 2012-06-02 ENCOUNTER — Emergency Department (HOSPITAL_COMMUNITY): Payer: Medicare Other

## 2012-06-02 ENCOUNTER — Emergency Department (HOSPITAL_COMMUNITY)
Admission: EM | Admit: 2012-06-02 | Discharge: 2012-06-02 | Disposition: A | Discharge status: Home / Self Care | Payer: Medicare Other | Attending: Emergency Medicine | Admitting: Emergency Medicine

## 2012-06-02 ENCOUNTER — Encounter (HOSPITAL_COMMUNITY): Payer: Self-pay | Admitting: *Deleted

## 2012-06-02 DIAGNOSIS — W19XXXA Unspecified fall, initial encounter: Secondary | ICD-10-CM

## 2012-06-02 DIAGNOSIS — E079 Disorder of thyroid, unspecified: Secondary | ICD-10-CM | POA: Insufficient documentation

## 2012-06-02 DIAGNOSIS — Z7982 Long term (current) use of aspirin: Secondary | ICD-10-CM | POA: Insufficient documentation

## 2012-06-02 DIAGNOSIS — K219 Gastro-esophageal reflux disease without esophagitis: Secondary | ICD-10-CM | POA: Insufficient documentation

## 2012-06-02 DIAGNOSIS — Z23 Encounter for immunization: Secondary | ICD-10-CM | POA: Insufficient documentation

## 2012-06-02 DIAGNOSIS — F0390 Unspecified dementia without behavioral disturbance: Secondary | ICD-10-CM | POA: Insufficient documentation

## 2012-06-02 DIAGNOSIS — I10 Essential (primary) hypertension: Secondary | ICD-10-CM | POA: Insufficient documentation

## 2012-06-02 DIAGNOSIS — S0190XA Unspecified open wound of unspecified part of head, initial encounter: Secondary | ICD-10-CM | POA: Insufficient documentation

## 2012-06-02 DIAGNOSIS — S0100XA Unspecified open wound of scalp, initial encounter: Secondary | ICD-10-CM | POA: Insufficient documentation

## 2012-06-02 DIAGNOSIS — S0191XA Laceration without foreign body of unspecified part of head, initial encounter: Secondary | ICD-10-CM

## 2012-06-02 DIAGNOSIS — X58XXXA Exposure to other specified factors, initial encounter: Secondary | ICD-10-CM | POA: Insufficient documentation

## 2012-06-02 DIAGNOSIS — N390 Urinary tract infection, site not specified: Secondary | ICD-10-CM | POA: Insufficient documentation

## 2012-06-02 DIAGNOSIS — R51 Headache: Secondary | ICD-10-CM | POA: Insufficient documentation

## 2012-06-02 DIAGNOSIS — Z79899 Other long term (current) drug therapy: Secondary | ICD-10-CM | POA: Insufficient documentation

## 2012-06-02 DIAGNOSIS — Y92009 Unspecified place in unspecified non-institutional (private) residence as the place of occurrence of the external cause: Secondary | ICD-10-CM | POA: Insufficient documentation

## 2012-06-02 HISTORY — DX: Essential (primary) hypertension: I10

## 2012-06-02 HISTORY — DX: Gastro-esophageal reflux disease without esophagitis: K21.9

## 2012-06-02 HISTORY — DX: Unspecified dementia, unspecified severity, without behavioral disturbance, psychotic disturbance, mood disturbance, and anxiety: F03.90

## 2012-06-02 HISTORY — DX: Disorder of thyroid, unspecified: E07.9

## 2012-06-02 HISTORY — DX: Unspecified cataract: H26.9

## 2012-06-02 LAB — CBC WITH DIFFERENTIAL/PLATELET
Basophils Absolute: 0 10*3/uL (ref 0.0–0.1)
Basophils Relative: 0 % (ref 0–1)
Hemoglobin: 9.6 g/dL — ABNORMAL LOW (ref 12.0–15.0)
MCHC: 32.5 g/dL (ref 30.0–36.0)
Neutro Abs: 2.2 10*3/uL (ref 1.7–7.7)
Neutrophils Relative %: 52 % (ref 43–77)
RDW: 14.5 % (ref 11.5–15.5)

## 2012-06-02 LAB — BASIC METABOLIC PANEL
Chloride: 105 mEq/L (ref 96–112)
GFR calc Af Amer: 26 mL/min — ABNORMAL LOW (ref >90)
Potassium: 3.9 mEq/L (ref 3.5–5.1)

## 2012-06-02 LAB — URINALYSIS, ROUTINE W REFLEX MICROSCOPIC
Hgb urine dipstick: NEGATIVE
Protein, ur: 30 mg/dL — AB
Urobilinogen, UA: 0.2 mg/dL (ref 0.0–1.0)

## 2012-06-02 LAB — URINE MICROSCOPIC-ADD ON

## 2012-06-02 LAB — TROPONIN I: Troponin I: <0.3 ng/mL (ref <0.30)

## 2012-06-02 MED ORDER — CEPHALEXIN 500 MG PO CAPS: 500.0000 mg | ORAL_CAPSULE | Freq: Four times a day (QID) | ORAL | Status: AC

## 2012-06-02 MED ORDER — LIDOCAINE-EPINEPHRINE 2 %-1:100000 IJ SOLN
20.0000 mL | Freq: Once | INTRAMUSCULAR | Status: DC
  Filled 2012-06-02: qty 20

## 2012-06-02 MED ORDER — TETANUS-DIPHTH-ACELL PERTUSSIS 5-2.5-18.5 LF-MCG/0.5 IM SUSP
0.5000 mL | Freq: Once | INTRAMUSCULAR | Status: AC
  Administered 2012-06-02: 0.5 mL via INTRAMUSCULAR
  Filled 2012-06-02: qty 0.5

## 2012-06-02 NOTE — ED Notes (Signed)
Patient is resting comfortably. 

## 2012-06-02 NOTE — ED Notes (Signed)
Pt tolerated po food and drink

## 2012-06-02 NOTE — ED Notes (Signed)
Patient fell this morning , time unknown, patient unsure of events surrounding fall. Patient with laceration to back posterior scalp, bleeding controlled at this time

## 2012-06-02 NOTE — ED Notes (Signed)
Patient transported to CT 

## 2012-06-02 NOTE — ED Notes (Signed)
Patient returned to room. 

## 2012-06-02 NOTE — ED Provider Notes (Signed)
History     CSN: [id number]  Arrival date & time 06/02/12  0830   First MD Initiated Contact with Patient 06/02/12 [phone]      Chief Complaint  Patient presents with  . Fall    (Consider location/radiation/quality/duration/timing/severity/associated sxs/prior treatment) HPI Comments: Patient presents with evidence of fall sometime overnight. She does not recall the events. Her caregiver found blood on her pillow and blood in her head this morning. Patient was doing her normal activities and got up on her own. She complains of a headache. Denies dizziness, lightheadedness, chest pain or shortness of breath. No fevers, vomiting or abdominal pain. He is not on any blood thinners. Per family she is acting normally at her baseline.  The history is provided by the patient and a relative.    Past Medical History  Diagnosis Date  . PAC (premature atrial contraction)   . Thyroid disease   . Hypertension   . Dementia   . Cataract   . Acid reflux     History reviewed. No pertinent past surgical history.  Family History  Problem Relation Age of Onset  . Heart failure    . Heart attack      History  Substance Use Topics  . Smoking status: Never Smoker   . Smokeless tobacco: Not on file  . Alcohol Use: No    OB History    Grav Para Term Preterm Abortions TAB SAB Ect Mult Living                  Review of Systems  Constitutional: Negative for fever, activity change and appetite change.  HENT: Negative for congestion and rhinorrhea.   Respiratory: Negative for cough, chest tightness and shortness of breath.   Cardiovascular: Negative for chest pain.  Gastrointestinal: Negative for nausea, vomiting and abdominal pain.  Genitourinary: Negative for dysuria.  Musculoskeletal: Negative for back pain.  Skin: Positive for wound.  Neurological: Positive for headaches. Negative for dizziness and weakness.    Allergies  Review of patient's allergies indicates no known  allergies.  Home Medications   Current Outpatient Rx  Name Route Sig Dispense Refill  . AMLODIPINE BESYLATE 10 MG PO TABS Oral Take 10 mg by mouth daily.    . ASPIRIN 81 MG PO TABS Oral Take 81 mg by mouth daily.    Marland Kitchen VITAMIN D 1000 UNITS PO TABS Oral Take 1,000 Units by mouth daily.    Marland Kitchen VITAMIN B-12 IJ Injection Inject 1 application as directed every 30 (thirty) days.     . DONEPEZIL HCL 10 MG PO TABS Oral Take 10 mg by mouth at bedtime.    . FUROSEMIDE 20 MG PO TABS Oral Take 20 mg by mouth.    Marland Kitchen HYDRALAZINE HCL 25 MG PO TABS Oral Take 25 mg by mouth 2 (two) times daily.    Marland Kitchen LEVOTHYROXINE SODIUM 88 MCG PO TABS Oral Take 88 mcg by mouth daily.    Marland Kitchen OMEPRAZOLE 20 MG PO CPDR Oral Take 20 mg by mouth daily.      BP 172/68  Pulse 57  Temp 98.7 F (37.1 C) (Oral)  Resp 18  SpO2 99%  Physical Exam  Constitutional: She appears well-developed and well-nourished. No distress.  HENT:  Head: Normocephalic.  Mouth/Throat: Oropharynx is clear and moist. No oropharyngeal exudate.       Dried blood to hair and occiput  Eyes: Conjunctivae normal and EOM are normal. Pupils are equal, round, and reactive to light.  Neck:  Normal range of motion. Neck supple.       No C spine pain or deformity  Cardiovascular: Normal rate, regular rhythm and normal heart sounds.   No murmur heard. Pulmonary/Chest: Effort normal and breath sounds normal. No respiratory distress.  Abdominal: Soft. There is no tenderness. There is no rebound and no guarding.  Musculoskeletal: Normal range of motion. She exhibits no edema and no tenderness.  Neurological: She is alert.       Oriented to self and place. CN 2-12 intact 5/5 strength throughout No ataxia on finger to nose  Skin: Skin is warm.    ED Course  LACERATION REPAIR Date/Time: 06/02/2012 10:38 AM Performed by: Glynn Octave Authorized by: Glynn Octave Consent: Verbal consent obtained. Risks and benefits: risks, benefits and alternatives were  discussed Consent given by: patient Patient understanding: patient states understanding of the procedure being performed Patient identity confirmed: verbally with patient Time out: Immediately prior to procedure a "time out" was called to verify the correct patient, procedure, equipment, support staff and site/side marked as required. Body area: head/neck Location details: scalp Laceration length: 2 cm Tendon involvement: none Nerve involvement: none Vascular damage: no Anesthesia: local infiltration Local anesthetic: lidocaine 2% with epinephrine Anesthetic total: 4 ml Patient sedated: no Preparation: Patient was prepped and draped in the usual sterile fashion. Irrigation solution: saline Irrigation method: syringe Amount of cleaning: standard Debridement: none Degree of undermining: none Skin closure: staples Number of sutures: 3 Technique: simple Approximation: close Approximation difficulty: simple Dressing: antibiotic ointment Patient tolerance: Patient tolerated the procedure well with no immediate complications.   (including critical care time)  Labs Reviewed  CBC WITH DIFFERENTIAL - Abnormal; Notable for the following:    RBC 3.14 (*)     Hemoglobin 9.6 (*)     HCT 29.5 (*)     Monocytes Relative 16 (*)     All other components within normal limits  BASIC METABOLIC PANEL - Abnormal; Notable for the following:    BUN 36 (*)     Creatinine, Ser 1.84 (*)     GFR calc non Af Amer 22 (*)     GFR calc Af Amer 26 (*)     All other components within normal limits  TROPONIN I   Dg Chest 2 View  06/02/2012  *RADIOLOGY REPORT*  Clinical Data: Fall  CHEST - 2 VIEW  Comparison: 08/22/2010  Findings: Cardiomegaly again noted.  Stable moderate sized hiatal hernia.  No acute infiltrate or pulmonary edema.  No diagnostic pneumothorax.  Stable chronic compression fractures of mid and lower thoracic spine.  IMPRESSION:  Stable moderate sized hiatal hernia.  No acute infiltrate or  pulmonary edema.  No diagnostic pneumothorax.  Stable chronic compression fractures of mid and lower thoracic spine.   Original Report Authenticated By: Natasha Mead, M.D.    Dg Pelvis 1-2 Views  06/02/2012  *RADIOLOGY REPORT*  Clinical Data: Fall.  Pain.  PELVIS - 1-2 VIEW  Comparison: None  Findings: Mild joint space narrowing and spurring in the DIP joints bilaterally.  Diffuse osteopenia. No acute bony abnormality. Specifically, no fracture, subluxation, or dislocation.  Soft tissues are intact.  IMPRESSION: No acute bony abnormality.   Original Report Authenticated By: Cyndie Chime, M.D.      No diagnosis found.    MDM  Fall overnight, unwitnessed, evidence of head trauma.  No focal neuro deficits.  Mentating at baseline per family. No chest pain, abdominal pain, back pain.  renal insufficiency similar to previous.  UTI noted.  Patient at baseline per family. Discussed with them the unknown circumstances surrounding the fall and unlikely possibility that it could be syncope related.  No arrhythmias in ED, no murmurs, ambulatory in the ED and tolerating pO.  Family agrees fall is likely mechanical and does not wish to pursue syncope workup in this very elderly patient.      Date: 06/02/2012  Rate: 54  Rhythm: normal sinus rhythm  QRS Axis: left  Intervals: normal  ST/T Wave abnormalities: normal  Conduction Disutrbances:first-degree A-V block   Narrative Interpretation:   Old EKG Reviewed: unchanged    Glynn Octave, MD 06/03/12 [phone]

## 2012-06-02 NOTE — ED Notes (Signed)
D/c I/v 

## 2012-06-02 NOTE — ED Notes (Signed)
Patient up to ambulate with PCT present. Tolerated well. MD at bedside during process

## 2012-06-06 ENCOUNTER — Encounter: Payer: Self-pay | Admitting: Cardiology

## 2012-06-10 ENCOUNTER — Encounter (HOSPITAL_COMMUNITY): Payer: Self-pay

## 2012-06-10 ENCOUNTER — Emergency Department (HOSPITAL_COMMUNITY)
Admission: EM | Admit: 2012-06-10 | Discharge: 2012-06-10 | Disposition: A | Discharge status: Home / Self Care | Payer: Medicare Other | Attending: Emergency Medicine | Admitting: Emergency Medicine

## 2012-06-10 DIAGNOSIS — I10 Essential (primary) hypertension: Secondary | ICD-10-CM | POA: Insufficient documentation

## 2012-06-10 DIAGNOSIS — E079 Disorder of thyroid, unspecified: Secondary | ICD-10-CM | POA: Insufficient documentation

## 2012-06-10 DIAGNOSIS — K219 Gastro-esophageal reflux disease without esophagitis: Secondary | ICD-10-CM | POA: Insufficient documentation

## 2012-06-10 DIAGNOSIS — Z4802 Encounter for removal of sutures: Secondary | ICD-10-CM | POA: Insufficient documentation

## 2012-06-10 DIAGNOSIS — F0390 Unspecified dementia without behavioral disturbance: Secondary | ICD-10-CM | POA: Insufficient documentation

## 2012-06-10 NOTE — ED Provider Notes (Signed)
History  This chart was scribed for [employer]. Rubin Payor, MD by Shari Heritage. The patient was seen in room TR07C/TR07C. Patient's care was started at 1424.     CSN: [id number]  Arrival date & time 06/10/12  1413   First MD Initiated Contact with Patient 06/10/12 1424      Chief Complaint  Patient presents with  . Suture / Staple Removal    The history is provided by a relative and medical records. No language interpreter was used.    Emily Peters is a 78 y.o. female who presents to the Emergency Department needing staple removal from the crown of her head. Per medical records, patient was seen on 06/02/12 by Dr. Glynn Octave after a fall. Patient received 3 staples to the scalp for a 2 cm laceration. Patient has a history of thyroid disease, HTN, dementia, cataracts and acid reflux. She has never smoked.  Past Medical History  Diagnosis Date  . PAC (premature atrial contraction)   . Thyroid disease   . Hypertension   . Dementia   . Cataract   . Acid reflux     No past surgical history on file.  Family History  Problem Relation Age of Onset  . Heart failure    . Heart attack      History  Substance Use Topics  . Smoking status: Never Smoker   . Smokeless tobacco: Not on file  . Alcohol Use: No    OB History    Grav Para Term Preterm Abortions TAB SAB Ect Mult Living                  Review of Systems  Skin: Positive for wound.  All other systems reviewed and are negative.    Allergies  Review of patient's allergies indicates no known allergies.  Home Medications   Current Outpatient Rx  Name Route Sig Dispense Refill  . AMLODIPINE BESYLATE 10 MG PO TABS Oral Take 10 mg by mouth daily.    . ASPIRIN 81 MG PO TABS Oral Take 81 mg by mouth daily.    . CEPHALEXIN 500 MG PO CAPS Oral Take 1 capsule (500 mg total) by mouth 4 (four) times daily. 40 capsule 0  . VITAMIN D 1000 UNITS PO TABS Oral Take 1,000 Units by mouth daily.    Marland Kitchen VITAMIN B-12 IJ Injection  Inject 1 application as directed every 30 (thirty) days.     . DONEPEZIL HCL 10 MG PO TABS Oral Take 10 mg by mouth at bedtime.    . FUROSEMIDE 20 MG PO TABS Oral Take 20 mg by mouth.    Marland Kitchen HYDRALAZINE HCL 25 MG PO TABS Oral Take 25 mg by mouth 2 (two) times daily.    Marland Kitchen LEVOTHYROXINE SODIUM 88 MCG PO TABS Oral Take 88 mcg by mouth daily.    Marland Kitchen OMEPRAZOLE 20 MG PO CPDR Oral Take 20 mg by mouth daily.      BP 135/44  Pulse 47  Temp 98 F (36.7 C) (Oral)  Resp 18  SpO2 98%  Physical Exam  Constitutional: She is oriented to person, place, and time. She appears well-developed and well-nourished.  HENT:  Head: Normocephalic.       3 staples to occiput area. Well-healing. No erythema. No induration. No fluctuance. Removed.  Cardiovascular: Normal rate and regular rhythm.   Pulmonary/Chest: Effort normal and breath sounds normal. No respiratory distress.  Musculoskeletal: Normal range of motion.  Neurological: She is alert and oriented  to person, place, and time.  Skin: Skin is warm and dry.  Psychiatric: She has a normal mood and affect. Her behavior is normal.    ED Course  Procedures (including critical care time) COORDINATION OF CARE: 2:37pm- Patient informed of current plan for treatment and evaluation and agrees with plan at this time.      Labs Reviewed - No data to display No results found.   1. Encounter for staple removal       MDM  Staple removal. Well-healing. Discharge home      I personally performed the services described in this documentation, which was scribed in my presence. The recorded information has been reviewed and considered.     Juliet Rude. Rubin Payor, MD 06/10/12 [phone]

## 2012-06-10 NOTE — ED Notes (Signed)
Pt needs three staples removed from crown of head.

## 2012-06-10 NOTE — ED Notes (Signed)
Dr. Rubin Payor at the bedside to remove staples

## 2013-01-13 ENCOUNTER — Emergency Department (HOSPITAL_COMMUNITY): Payer: Medicare Other

## 2013-01-13 ENCOUNTER — Encounter (HOSPITAL_COMMUNITY): Payer: Self-pay | Admitting: Emergency Medicine

## 2013-01-13 ENCOUNTER — Emergency Department (HOSPITAL_COMMUNITY)
Admission: EM | Admit: 2013-01-13 | Discharge: 2013-01-13 | Disposition: A | Discharge status: Home / Self Care | Payer: Medicare Other | Attending: Emergency Medicine | Admitting: Emergency Medicine

## 2013-01-13 DIAGNOSIS — Z8679 Personal history of other diseases of the circulatory system: Secondary | ICD-10-CM | POA: Insufficient documentation

## 2013-01-13 DIAGNOSIS — I129 Hypertensive chronic kidney disease with stage 1 through stage 4 chronic kidney disease, or unspecified chronic kidney disease: Secondary | ICD-10-CM | POA: Insufficient documentation

## 2013-01-13 DIAGNOSIS — K219 Gastro-esophageal reflux disease without esophagitis: Secondary | ICD-10-CM | POA: Insufficient documentation

## 2013-01-13 DIAGNOSIS — Z79899 Other long term (current) drug therapy: Secondary | ICD-10-CM | POA: Insufficient documentation

## 2013-01-13 DIAGNOSIS — Z7982 Long term (current) use of aspirin: Secondary | ICD-10-CM | POA: Insufficient documentation

## 2013-01-13 DIAGNOSIS — I509 Heart failure, unspecified: Secondary | ICD-10-CM | POA: Insufficient documentation

## 2013-01-13 DIAGNOSIS — E079 Disorder of thyroid, unspecified: Secondary | ICD-10-CM | POA: Insufficient documentation

## 2013-01-13 DIAGNOSIS — F0390 Unspecified dementia without behavioral disturbance: Secondary | ICD-10-CM | POA: Insufficient documentation

## 2013-01-13 DIAGNOSIS — D649 Anemia, unspecified: Secondary | ICD-10-CM | POA: Insufficient documentation

## 2013-01-13 DIAGNOSIS — R609 Edema, unspecified: Secondary | ICD-10-CM | POA: Insufficient documentation

## 2013-01-13 DIAGNOSIS — N189 Chronic kidney disease, unspecified: Secondary | ICD-10-CM

## 2013-01-13 DIAGNOSIS — H269 Unspecified cataract: Secondary | ICD-10-CM | POA: Insufficient documentation

## 2013-01-13 HISTORY — DX: Disorder of kidney and ureter, unspecified: N28.9

## 2013-01-13 HISTORY — DX: Heart failure, unspecified: I50.9

## 2013-01-13 LAB — BASIC METABOLIC PANEL
BUN: 49 mg/dL — ABNORMAL HIGH (ref 6–23)
CO2: 22 mEq/L (ref 19–32)
GFR calc non Af Amer: 19 mL/min — ABNORMAL LOW (ref >90)
Glucose, Bld: 102 mg/dL — ABNORMAL HIGH (ref 70–99)
Potassium: 3.8 mEq/L (ref 3.5–5.1)

## 2013-01-13 LAB — CBC WITH DIFFERENTIAL/PLATELET
Basophils Relative: 0 % (ref 0–1)
Eosinophils Absolute: 0.1 10*3/uL (ref 0.0–0.7)
Eosinophils Relative: 3 % (ref 0–5)
HCT: 28 % — ABNORMAL LOW (ref 36.0–46.0)
Hemoglobin: 8.8 g/dL — ABNORMAL LOW (ref 12.0–15.0)
MCH: 26.5 pg (ref 26.0–34.0)
MCHC: 31.4 g/dL (ref 30.0–36.0)
MCV: 84.3 fL (ref 78.0–100.0)
Monocytes Absolute: 0.5 10*3/uL (ref 0.1–1.0)
Monocytes Relative: 11 % (ref 3–12)

## 2013-01-13 MED ORDER — FERROUS SULFATE 325 (65 FE) MG PO TABS: 325.0000 mg | ORAL_TABLET | Freq: Every day | ORAL | Status: DC

## 2013-01-13 NOTE — ED Notes (Signed)
EKG completed and given to Dr. Adriana Simas along with OLD ekg.

## 2013-01-13 NOTE — ED Notes (Signed)
Dr Cook at bedside

## 2013-01-13 NOTE — ED Provider Notes (Signed)
History     CSN: [id number]  Arrival date & time 01/13/13  1240   First MD Initiated Contact with Patient 01/13/13 1246      Chief Complaint  Patient presents with  . Congestive Heart Failure    (Consider location/radiation/quality/duration/timing/severity/associated sxs/prior treatment) HPI..... level V caveat for dementia.   Patient sent from primary care office for concerns or lower extremity edema and the possibility of congestive heart failure.   She complains of swelling in her legs but no frank dyspnea or chest pain. No fever, chills, weight loss, sputum production.   Nothing makes symptoms better or worse. Severity is mild to moderate. She is able to walk small distances with a walker. Symptoms have been going on for several weeks.  Past Medical History  Diagnosis Date  . PAC (premature atrial contraction)   . Thyroid disease   . Hypertension   . Dementia   . Cataract   . Acid reflux   . CHF (congestive heart failure)   . Renal disorder     History reviewed. No pertinent past surgical history.  Family History  Problem Relation Age of Onset  . Heart failure    . Heart attack      History  Substance Use Topics  . Smoking status: Never Smoker   . Smokeless tobacco: Not on file  . Alcohol Use: No    OB History   Grav Para Term Preterm Abortions TAB SAB Ect Mult Living                  Review of Systems  All other systems reviewed and are negative.    Allergies  Review of patient's allergies indicates no known allergies.  Home Medications   Current Outpatient Rx  Name  Route  Sig  Dispense  Refill  . amLODipine (NORVASC) 10 MG tablet   Oral   Take 10 mg by mouth daily.         Marland Kitchen aspirin 81 MG tablet   Oral   Take 81 mg by mouth daily.         . cholecalciferol (VITAMIN D) 1000 UNITS tablet   Oral   Take 1,000 Units by mouth daily.         . Cyanocobalamin (VITAMIN B-12 IJ)   Injection   Inject 1 application as directed every 30  (thirty) days.          Marland Kitchen donepezil (ARICEPT) 10 MG tablet   Oral   Take 10 mg by mouth at bedtime.         . furosemide (LASIX) 40 MG tablet   Oral   Take 40 mg by mouth 2 (two) times daily.         . hydrALAZINE (APRESOLINE) 25 MG tablet   Oral   Take 25 mg by mouth 2 (two) times daily.         Marland Kitchen levothyroxine (LEVOTHROID) 88 MCG tablet   Oral   Take 88 mcg by mouth daily.         . metoprolol (LOPRESSOR) 50 MG tablet   Oral   Take 50 mg by mouth 2 (two) times daily.         Marland Kitchen omeprazole (PRILOSEC) 20 MG capsule   Oral   Take 20 mg by mouth daily.         . ranitidine (ZANTAC) 150 MG tablet   Oral   Take 150 mg by mouth daily.         Marland Kitchen  Skin Protectants, Misc. (EUCERIN) cream   Topical   Apply 1 application topically as needed for dry skin.         Marland Kitchen triamcinolone ointment (KENALOG) 0.1 %   Topical   Apply 1 application topically 2 (two) times daily as needed (for rash).           BP 125/83  Pulse 84  Temp(Src) 98.6 F (37 C) (Oral)  Resp 21  SpO2 97%  Physical Exam  Nursing note and vitals reviewed. Constitutional: She is oriented to person, place, and time. She appears well-developed and well-nourished.  HENT:  Head: Normocephalic and atraumatic.  Eyes: Conjunctivae and EOM are normal. Pupils are equal, round, and reactive to light.  Neck: Normal range of motion. Neck supple.  Cardiovascular: Normal rate, regular rhythm and normal heart sounds.   Pulmonary/Chest: Effort normal and breath sounds normal.  Abdominal: Soft. Bowel sounds are normal.  Musculoskeletal: Normal range of motion.  Neurological: She is alert and oriented to person, place, and time.  Skin:  3+ peripheral edema  Psychiatric: She has a normal mood and affect.    ED Course  Procedures (including critical care time)  Labs Reviewed  BASIC METABOLIC PANEL - Abnormal; Notable for the following:    Glucose, Bld 102 (*)    BUN 49 (*)    Creatinine, Ser 2.12 (*)     GFR calc non Af Amer 19 (*)    GFR calc Af Amer 22 (*)    All other components within normal limits  CBC WITH DIFFERENTIAL - Abnormal; Notable for the following:    RBC 3.32 (*)    Hemoglobin 8.8 (*)    HCT 28.0 (*)    RDW 16.4 (*)    All other components within normal limits  TROPONIN I   Dg Chest Port 1 View  01/13/2013  *RADIOLOGY REPORT*  Clinical Data: Peripheral edema.  Evaluate for congestive heart failure.  PORTABLE CHEST - 1 VIEW  Comparison: Chest x-ray 06/02/2012.  Findings: Lung volumes are low.  No definite consolidative airspace disease.  No pleural effusions.  Mild diffuse peribronchial cuffing is noted.  No definite evidence of pulmonary edema.  Heart size is moderately enlarged (unchanged).  Hiatal hernia. The patient is rotated to the right on today's exam, resulting in distortion of the mediastinal contours and reduced diagnostic sensitivity and specificity for mediastinal pathology.  Atherosclerosis of the thoracic aorta.  IMPRESSION: 1.  Diffuse peribronchial cuffing, concerning for bronchitis. 2.  Low lung volumes. 3.  Moderate cardiomegaly. 4.  Atherosclerosis.   Original Report Authenticated By: Trudie Reed, M.D.      No diagnosis found.   Date: 01/13/2013  Rate: 98  Rhythm: atrial fibrillation  QRS Axis: normal  Intervals: normal  ST/T Wave abnormalities: normal  Conduction Disutrbances:left anterior fascicular block  Narrative Interpretation:   Old EKG Reviewed: changes noted   MDM  No clinical evidence of congestive heart failure. Legs are puffy. Patient is anemic.  Additionally creatinine is slightly elevated.  Will start iron replacement. Recommendations given for fluid retention. All findings were discussed with patient, son and daughter-in-law. They fully understand.        Donnetta Hutching, MD 01/13/13 1536

## 2013-01-13 NOTE — ED Notes (Signed)
Pt came from pcp with c/o bilateral edema, and sob due to CHF. Pt denies any pain, visible pitting edema to both legs with moderate pulses.

## 2013-04-14 ENCOUNTER — Encounter (HOSPITAL_BASED_OUTPATIENT_CLINIC_OR_DEPARTMENT_OTHER): Payer: Medicare Other | Attending: General Surgery

## 2013-04-14 DIAGNOSIS — L97809 Non-pressure chronic ulcer of other part of unspecified lower leg with unspecified severity: Secondary | ICD-10-CM | POA: Insufficient documentation

## 2013-04-14 DIAGNOSIS — I87309 Chronic venous hypertension (idiopathic) without complications of unspecified lower extremity: Secondary | ICD-10-CM | POA: Insufficient documentation

## 2013-04-14 DIAGNOSIS — I129 Hypertensive chronic kidney disease with stage 1 through stage 4 chronic kidney disease, or unspecified chronic kidney disease: Secondary | ICD-10-CM | POA: Insufficient documentation

## 2013-04-14 DIAGNOSIS — I872 Venous insufficiency (chronic) (peripheral): Secondary | ICD-10-CM | POA: Insufficient documentation

## 2013-04-14 DIAGNOSIS — N189 Chronic kidney disease, unspecified: Secondary | ICD-10-CM | POA: Insufficient documentation

## 2013-04-14 DIAGNOSIS — I509 Heart failure, unspecified: Secondary | ICD-10-CM | POA: Insufficient documentation

## 2013-04-14 DIAGNOSIS — Z79899 Other long term (current) drug therapy: Secondary | ICD-10-CM | POA: Insufficient documentation

## 2013-04-14 NOTE — Progress Notes (Signed)
[facility]  NAMEMALI, EPPARD                  ACCOUNT NO.:  [id number]  MEDICAL RECORD NO.:  [id number]      DATE OF BIRTH:  Nov 27, 1914  PHYSICIAN:  Ardath Sax, M.D.           VISIT DATE:                                  OFFICE VISIT   This is a 79 year old lady, who comes to [place] because of venous hypertension and chronic venous ulcers of both of her lower legs.  She has many medical problems including hypertension, and most notably dementia.  She also has a history of congestive heart failure and chronic renal disease, although she is not on dialysis.  Her medicines include Norvasc, aspirin, vitamins, iron, Aricept, Lasix, Apresoline, levothyroxine, Lopressor, Prilosec, ranitidine, and Kenalog ointment for rash.  She also reportedly has atrial fibrillation.  On examination here, her blood pressure is 119/71, respirations 16, pulse is 93, temperature 98.  She weighs 148 pounds.  She is not a diabetic.  Her legs have chronic venous stasis changes with some edema and certainly bronze discoloration of chronic venous disease.  She was treated with Unna boots, and we put some cortisone ointment on the rash on her legs and we will see her back here in a week.  So her diagnosis is venous hypertension and chronic venous ulcers bilateral legs, history of hypertension, chronic renal disease, congestive heart failure, atrial fibrillation, hypothyroidism.     Ardath Sax, M.D.     PP/MEDQ  D:  04/14/2013  T:  04/14/2013  Job:  [id number]

## 2013-04-21 ENCOUNTER — Encounter (HOSPITAL_BASED_OUTPATIENT_CLINIC_OR_DEPARTMENT_OTHER): Payer: Medicare Other | Attending: General Surgery

## 2013-04-21 DIAGNOSIS — L97809 Non-pressure chronic ulcer of other part of unspecified lower leg with unspecified severity: Secondary | ICD-10-CM | POA: Insufficient documentation

## 2013-04-21 DIAGNOSIS — I872 Venous insufficiency (chronic) (peripheral): Secondary | ICD-10-CM | POA: Insufficient documentation

## 2013-04-21 DIAGNOSIS — I87309 Chronic venous hypertension (idiopathic) without complications of unspecified lower extremity: Secondary | ICD-10-CM | POA: Insufficient documentation

## 2013-07-27 ENCOUNTER — Inpatient Hospital Stay (HOSPITAL_COMMUNITY)
Admission: EM | Admit: 2013-07-27 | Discharge: 2013-07-31 | DRG: 683 | Disposition: A | Discharge status: Skilled Nursing Facility | Payer: Medicare Other | Attending: Internal Medicine | Admitting: Internal Medicine

## 2013-07-27 ENCOUNTER — Encounter (HOSPITAL_COMMUNITY): Payer: Self-pay | Admitting: Emergency Medicine

## 2013-07-27 ENCOUNTER — Inpatient Hospital Stay (HOSPITAL_COMMUNITY): Payer: Medicare Other

## 2013-07-27 ENCOUNTER — Emergency Department (HOSPITAL_COMMUNITY): Payer: Medicare Other

## 2013-07-27 DIAGNOSIS — N179 Acute kidney failure, unspecified: Principal | ICD-10-CM | POA: Diagnosis present

## 2013-07-27 DIAGNOSIS — E538 Deficiency of other specified B group vitamins: Secondary | ICD-10-CM

## 2013-07-27 DIAGNOSIS — K219 Gastro-esophageal reflux disease without esophagitis: Secondary | ICD-10-CM | POA: Diagnosis present

## 2013-07-27 DIAGNOSIS — E860 Dehydration: Secondary | ICD-10-CM

## 2013-07-27 DIAGNOSIS — I5042 Chronic combined systolic (congestive) and diastolic (congestive) heart failure: Secondary | ICD-10-CM | POA: Diagnosis present

## 2013-07-27 DIAGNOSIS — Z66 Do not resuscitate: Secondary | ICD-10-CM | POA: Clinically undetermined

## 2013-07-27 DIAGNOSIS — I739 Peripheral vascular disease, unspecified: Secondary | ICD-10-CM | POA: Diagnosis present

## 2013-07-27 DIAGNOSIS — R079 Chest pain, unspecified: Secondary | ICD-10-CM

## 2013-07-27 DIAGNOSIS — N183 Chronic kidney disease, stage 3 unspecified: Secondary | ICD-10-CM | POA: Diagnosis present

## 2013-07-27 DIAGNOSIS — I5043 Acute on chronic combined systolic (congestive) and diastolic (congestive) heart failure: Secondary | ICD-10-CM

## 2013-07-27 DIAGNOSIS — Z7982 Long term (current) use of aspirin: Secondary | ICD-10-CM

## 2013-07-27 DIAGNOSIS — R6251 Failure to thrive (child): Secondary | ICD-10-CM | POA: Diagnosis present

## 2013-07-27 DIAGNOSIS — I10 Essential (primary) hypertension: Secondary | ICD-10-CM | POA: Diagnosis present

## 2013-07-27 DIAGNOSIS — R21 Rash and other nonspecific skin eruption: Secondary | ICD-10-CM

## 2013-07-27 DIAGNOSIS — M810 Age-related osteoporosis without current pathological fracture: Secondary | ICD-10-CM

## 2013-07-27 DIAGNOSIS — N189 Chronic kidney disease, unspecified: Secondary | ICD-10-CM | POA: Diagnosis present

## 2013-07-27 DIAGNOSIS — N289 Disorder of kidney and ureter, unspecified: Secondary | ICD-10-CM

## 2013-07-27 DIAGNOSIS — I4891 Unspecified atrial fibrillation: Secondary | ICD-10-CM | POA: Diagnosis present

## 2013-07-27 DIAGNOSIS — F0390 Unspecified dementia without behavioral disturbance: Secondary | ICD-10-CM | POA: Diagnosis present

## 2013-07-27 DIAGNOSIS — R1319 Other dysphagia: Secondary | ICD-10-CM

## 2013-07-27 DIAGNOSIS — IMO0002 Reserved for concepts with insufficient information to code with codable children: Secondary | ICD-10-CM

## 2013-07-27 DIAGNOSIS — R627 Adult failure to thrive: Secondary | ICD-10-CM | POA: Diagnosis present

## 2013-07-27 DIAGNOSIS — I509 Heart failure, unspecified: Secondary | ICD-10-CM

## 2013-07-27 DIAGNOSIS — I951 Orthostatic hypotension: Secondary | ICD-10-CM | POA: Diagnosis present

## 2013-07-27 DIAGNOSIS — M129 Arthropathy, unspecified: Secondary | ICD-10-CM | POA: Diagnosis present

## 2013-07-27 DIAGNOSIS — Z79899 Other long term (current) drug therapy: Secondary | ICD-10-CM

## 2013-07-27 DIAGNOSIS — I079 Rheumatic tricuspid valve disease, unspecified: Secondary | ICD-10-CM | POA: Diagnosis present

## 2013-07-27 DIAGNOSIS — R5381 Other malaise: Secondary | ICD-10-CM | POA: Diagnosis present

## 2013-07-27 DIAGNOSIS — E039 Hypothyroidism, unspecified: Secondary | ICD-10-CM | POA: Diagnosis present

## 2013-07-27 DIAGNOSIS — Z8249 Family history of ischemic heart disease and other diseases of the circulatory system: Secondary | ICD-10-CM

## 2013-07-27 DIAGNOSIS — I129 Hypertensive chronic kidney disease with stage 1 through stage 4 chronic kidney disease, or unspecified chronic kidney disease: Secondary | ICD-10-CM | POA: Diagnosis present

## 2013-07-27 DIAGNOSIS — M25551 Pain in right hip: Secondary | ICD-10-CM | POA: Diagnosis present

## 2013-07-27 HISTORY — DX: Peripheral vascular disease, unspecified: I73.9

## 2013-07-27 HISTORY — DX: Unspecified osteoarthritis, unspecified site: M19.90

## 2013-07-27 HISTORY — DX: Hypothyroidism, unspecified: E03.9

## 2013-07-27 LAB — CBC WITH DIFFERENTIAL/PLATELET
Basophils Absolute: 0 10*3/uL (ref 0.0–0.1)
Basophils Relative: 0 % (ref 0–1)
Eosinophils Absolute: 0 K/uL (ref 0.0–0.7)
Eosinophils Relative: 1 % (ref 0–5)
HCT: 29.2 % — ABNORMAL LOW (ref 36.0–46.0)
Hemoglobin: 9.4 g/dL — ABNORMAL LOW (ref 12.0–15.0)
Lymphocytes Relative: 17 % (ref 12–46)
Lymphs Abs: 0.6 K/uL — ABNORMAL LOW (ref 0.7–4.0)
MCH: 32 pg (ref 26.0–34.0)
MCHC: 32.2 g/dL (ref 30.0–36.0)
MCV: 99.3 fL (ref 78.0–100.0)
Monocytes Absolute: 0.5 10*3/uL (ref 0.1–1.0)
Monocytes Relative: 14 % — ABNORMAL HIGH (ref 3–12)
Neutro Abs: 2.5 10*3/uL (ref 1.7–7.7)
Neutrophils Relative %: 68 % (ref 43–77)
Platelets: 207 10*3/uL (ref 150–400)
RBC: 2.94 MIL/uL — ABNORMAL LOW (ref 3.87–5.11)
RDW: 19.2 % — ABNORMAL HIGH (ref 11.5–15.5)
WBC: 3.7 10*3/uL — ABNORMAL LOW (ref 4.0–10.5)

## 2013-07-27 LAB — COMPREHENSIVE METABOLIC PANEL
ALT: 27 U/L (ref 0–35)
AST: 45 U/L — ABNORMAL HIGH (ref 0–37)
Albumin: 2.4 g/dL — ABNORMAL LOW (ref 3.5–5.2)
BUN: 63 mg/dL — ABNORMAL HIGH (ref 6–23)
CO2: 25 mEq/L (ref 19–32)
Calcium: 8.8 mg/dL (ref 8.4–10.5)
Chloride: 105 mEq/L (ref 96–112)
Creatinine, Ser: 2.2 mg/dL — ABNORMAL HIGH (ref 0.50–1.10)
GFR calc non Af Amer: 18 mL/min — ABNORMAL LOW (ref >90)
Glucose, Bld: 95 mg/dL (ref 70–99)
Sodium: 139 mEq/L (ref 135–145)
Total Bilirubin: 0.4 mg/dL (ref 0.3–1.2)

## 2013-07-27 LAB — COMPREHENSIVE METABOLIC PANEL WITH GFR
Alkaline Phosphatase: 110 U/L (ref 39–117)
GFR calc Af Amer: 21 mL/min — ABNORMAL LOW (ref >90)
Potassium: 4.1 meq/L (ref 3.5–5.1)
Total Protein: 8.3 g/dL (ref 6.0–8.3)

## 2013-07-27 LAB — URINALYSIS, ROUTINE W REFLEX MICROSCOPIC
Glucose, UA: NEGATIVE mg/dL
Hgb urine dipstick: NEGATIVE
Ketones, ur: NEGATIVE mg/dL
Protein, ur: 30 mg/dL — AB
pH: 5.5 (ref 5.0–8.0)

## 2013-07-27 LAB — POCT I-STAT TROPONIN I: Troponin i, poc: 0.01 ng/mL (ref 0.00–0.08)

## 2013-07-27 LAB — URINE MICROSCOPIC-ADD ON

## 2013-07-27 LAB — PROTIME-INR
INR: 1.09 (ref 0.00–1.49)
Prothrombin Time: 13.9 seconds (ref 11.6–15.2)

## 2013-07-27 LAB — CG4 I-STAT (LACTIC ACID): Lactic Acid, Venous: 1.38 mmol/L (ref 0.5–2.2)

## 2013-07-27 LAB — LIPASE, BLOOD: Lipase: 68 U/L — ABNORMAL HIGH (ref 11–59)

## 2013-07-27 LAB — CK: Total CK: 35 U/L (ref 7–177)

## 2013-07-27 LAB — PRO B NATRIURETIC PEPTIDE: Pro B Natriuretic peptide (BNP): 10117 pg/mL — ABNORMAL HIGH (ref 0–450)

## 2013-07-27 LAB — APTT: aPTT: 62 seconds — ABNORMAL HIGH (ref 24–37)

## 2013-07-27 MED ORDER — METOPROLOL TARTRATE 12.5 MG HALF TABLET: 12.5000 mg | ORAL_TABLET | Freq: Two times a day (BID) | ORAL | Status: DC

## 2013-07-27 MED ORDER — HYDROMORPHONE HCL PF 1 MG/ML IJ SOLN: 1.0000 mg | INTRAMUSCULAR | Status: DC | PRN

## 2013-07-27 MED ORDER — FERROUS SULFATE 325 (65 FE) MG PO TABS
325.0000 mg | ORAL_TABLET | Freq: Every day | ORAL | Status: DC
  Administered 2013-07-27 – 2013-07-31 (×5): 325 mg via ORAL
  Filled 2013-07-27 (×5): qty 1

## 2013-07-27 MED ORDER — HEPARIN SODIUM (PORCINE) 5000 UNIT/ML IJ SOLN
5000.0000 [IU] | Freq: Three times a day (TID) | INTRAMUSCULAR | Status: DC
  Administered 2013-07-27 – 2013-07-31 (×11): 5000 [IU] via SUBCUTANEOUS
  Filled 2013-07-27 (×14): qty 1

## 2013-07-27 MED ORDER — SODIUM CHLORIDE 0.9 % IV BOLUS (SEPSIS)
500.0000 mL | Freq: Once | INTRAVENOUS | Status: AC
  Administered 2013-07-27: 500 mL via INTRAVENOUS

## 2013-07-27 MED ORDER — LEVOTHYROXINE SODIUM 100 MCG PO TABS
100.0000 ug | ORAL_TABLET | Freq: Every day | ORAL | Status: DC
  Administered 2013-07-28 – 2013-07-31 (×4): 100 ug via ORAL
  Filled 2013-07-27 (×5): qty 1

## 2013-07-27 MED ORDER — ASPIRIN 81 MG PO TABS: 81.0000 mg | ORAL_TABLET | Freq: Every day | ORAL | Status: DC

## 2013-07-27 MED ORDER — ACETAMINOPHEN 650 MG RE SUPP: 650.0000 mg | Freq: Four times a day (QID) | RECTAL | Status: DC | PRN

## 2013-07-27 MED ORDER — SODIUM CHLORIDE 0.9 % IJ SOLN
3.0000 mL | Freq: Two times a day (BID) | INTRAMUSCULAR | Status: DC
  Administered 2013-07-27 – 2013-07-31 (×8): 3 mL via INTRAVENOUS

## 2013-07-27 MED ORDER — PANTOPRAZOLE SODIUM 40 MG PO TBEC
80.0000 mg | DELAYED_RELEASE_TABLET | Freq: Every day | ORAL | Status: DC
  Administered 2013-07-28 – 2013-07-31 (×4): 80 mg via ORAL
  Filled 2013-07-27 (×3): qty 2

## 2013-07-27 MED ORDER — DONEPEZIL HCL 10 MG PO TABS
10.0000 mg | ORAL_TABLET | Freq: Every day | ORAL | Status: DC
  Administered 2013-07-27 – 2013-07-30 (×4): 10 mg via ORAL
  Filled 2013-07-27 (×5): qty 1

## 2013-07-27 MED ORDER — ONDANSETRON HCL 4 MG/2ML IJ SOLN
4.0000 mg | Freq: Four times a day (QID) | INTRAMUSCULAR | Status: DC | PRN
  Administered 2013-07-30: 4 mg via INTRAVENOUS
  Filled 2013-07-27: qty 2

## 2013-07-27 MED ORDER — METOPROLOL TARTRATE 12.5 MG HALF TABLET
12.5000 mg | ORAL_TABLET | Freq: Three times a day (TID) | ORAL | Status: DC
  Administered 2013-07-27 – 2013-07-31 (×11): 12.5 mg via ORAL
  Filled 2013-07-27 (×13): qty 1

## 2013-07-27 MED ORDER — HYDROCODONE-ACETAMINOPHEN 5-325 MG PO TABS
1.0000 | ORAL_TABLET | ORAL | Status: DC | PRN
  Administered 2013-07-29 – 2013-07-30 (×3): 1 via ORAL
  Filled 2013-07-27 (×3): qty 1

## 2013-07-27 MED ORDER — ACETAMINOPHEN 325 MG PO TABS
650.0000 mg | ORAL_TABLET | Freq: Four times a day (QID) | ORAL | Status: DC | PRN
  Administered 2013-07-28 – 2013-07-29 (×2): 650 mg via ORAL
  Filled 2013-07-27 (×2): qty 2

## 2013-07-27 MED ORDER — ONDANSETRON HCL 4 MG PO TABS: 4.0000 mg | ORAL_TABLET | Freq: Four times a day (QID) | ORAL | Status: DC | PRN

## 2013-07-27 MED ORDER — VITAMIN D3 25 MCG (1000 UNIT) PO TABS
2000.0000 [IU] | ORAL_TABLET | Freq: Every day | ORAL | Status: DC
  Administered 2013-07-28 – 2013-07-31 (×4): 2000 [IU] via ORAL
  Filled 2013-07-27 (×4): qty 2

## 2013-07-27 MED ORDER — ASPIRIN EC 81 MG PO TBEC
81.0000 mg | DELAYED_RELEASE_TABLET | Freq: Every day | ORAL | Status: DC
  Administered 2013-07-27 – 2013-07-31 (×5): 81 mg via ORAL
  Filled 2013-07-27 (×5): qty 1

## 2013-07-27 NOTE — ED Notes (Signed)
Report given to Pattricia Boss, RN on floor. RN has no further questions upon report given. Pt taken to radiology. RN notified that pt that pt would be taken to floor after xray.

## 2013-07-27 NOTE — ED Notes (Signed)
Chest xray at bedside.

## 2013-07-27 NOTE — ED Notes (Signed)
Lactic acid results called to Diplomatic Services operational officer for Leotis Shames, RN in [place]

## 2013-07-27 NOTE — ED Notes (Signed)
Family reported to EMS staff that Pt was to weak to walk. Family reports Pt lives at home and has been to weak to walk.

## 2013-07-27 NOTE — Progress Notes (Deleted)
1830 Received tel report from [facility] ED

## 2013-07-27 NOTE — ED Provider Notes (Signed)
CSN: [id number]     Arrival date & time 07/27/13  1111 History   First MD Initiated Contact with Patient 07/27/13 1116     Chief Complaint  Patient presents with  . Extremity Weakness   (Consider location/radiation/quality/duration/timing/severity/associated sxs/prior Treatment) HPI Comments: Pt did eat some oatmeal, lives alone except for gradnson who stays at night.  Pt not able to get around much.  She reports son must have called EMS.  Pt feels achy, has pain in lower legs and knees, some in abdomen.  No N/V, no constipation, possibly slight diarrhea.  Did take AM meds today.    Patient is a 79 y.o. female presenting with weakness. The history is provided by the patient and a relative.  Weakness This is a new problem. The current episode started more than 2 days ago. The problem occurs constantly. The problem has been gradually worsening. Associated symptoms include abdominal pain. Pertinent negatives include no headaches and no shortness of breath. Associated symptoms comments: Myalgias, cough, slight diarrhea.    Past Medical History  Diagnosis Date  . PAC (premature atrial contraction)   . Thyroid disease   . Hypertension   . Dementia   . Cataract   . Acid reflux   . CHF (congestive heart failure)   . Renal disorder    History reviewed. No pertinent past surgical history. Family History  Problem Relation Age of Onset  . Heart failure    . Heart attack     History  Substance Use Topics  . Smoking status: Never Smoker   . Smokeless tobacco: Not on file  . Alcohol Use: No   OB History   Grav Para Term Preterm Abortions TAB SAB Ect Mult Living                 Review of Systems  Unable to perform ROS: Age  Respiratory: Negative for shortness of breath.   Gastrointestinal: Positive for abdominal pain.  Neurological: Positive for weakness. Negative for headaches.    Allergies  Review of patient's allergies indicates no known allergies.  Home Medications    Current Outpatient Rx  Name  Route  Sig  Dispense  Refill  . amLODipine (NORVASC) 10 MG tablet   Oral   Take 10 mg by mouth daily.         Marland Kitchen aspirin 81 MG tablet   Oral   Take 81 mg by mouth daily.         . cholecalciferol (VITAMIN D) 1000 UNITS tablet   Oral   Take 2,000 Units by mouth daily.          . Cyanocobalamin (VITAMIN B-12 IJ)   Injection   Inject 1 application as directed every 30 (thirty) days.          Marland Kitchen donepezil (ARICEPT) 10 MG tablet   Oral   Take 10 mg by mouth at bedtime.         . ferrous sulfate 325 (65 FE) MG tablet   Oral   Take 1 tablet (325 mg total) by mouth daily.   30 tablet   1   . furosemide (LASIX) 40 MG tablet   Oral   Take 40 mg by mouth 2 (two) times daily.         . hydrALAZINE (APRESOLINE) 25 MG tablet   Oral   Take 25 mg by mouth 2 (two) times daily.         Marland Kitchen levothyroxine (SYNTHROID, LEVOTHROID) 100 MCG tablet  Oral   Take 100 mcg by mouth daily before breakfast.         . metoprolol (LOPRESSOR) 50 MG tablet   Oral   Take 50 mg by mouth 2 (two) times daily.         Marland Kitchen omeprazole (PRILOSEC) 40 MG capsule   Oral   Take 40 mg by mouth daily.         . ranitidine (ZANTAC) 150 MG tablet   Oral   Take 150 mg by mouth daily.         . Skin Protectants, Misc. (EUCERIN) cream   Topical   Apply 1 application topically 3 (three) times daily as needed for dry skin.          Marland Kitchen triamcinolone ointment (KENALOG) 0.1 %   Topical   Apply 1 application topically 2 (two) times daily as needed (for rash).          BP 120/82  Pulse 79  Temp(Src) 96.6 F (35.9 C) (Rectal)  Resp 17  SpO2 93% Physical Exam  Nursing note and vitals reviewed. Constitutional: She appears well-developed. No distress.  HENT:  Head: Normocephalic and atraumatic.  Eyes: Conjunctivae and EOM are normal. No scleral icterus.  Neck: Neck supple. No JVD present.  Cardiovascular: Normal rate, S1 normal and S2 normal.  An  irregularly irregular rhythm present.  No murmur heard. Pulses:      Radial pulses are 2+ on the right side, and 2+ on the left side.       Dorsalis pedis pulses are 2+ on the left side.  Pulmonary/Chest: Effort normal. No respiratory distress. She has no wheezes.  Dry coughing  Abdominal: Soft. She exhibits no distension. There is no tenderness. There is no rebound and no guarding.  Musculoskeletal: She exhibits tenderness. She exhibits no edema.  B lower legs, dorsum of feet and ankles with corrugated skin, thickened, erythematous, appears pruritic and excoriated around edges, not fluctuant, no purulent drainage.  Edges are flaked and appears dry in the middle.  Area of eccymosis on bottom of right 1st toe.  Decreased PT on right foot, normal on left.    Neurological: She is alert. She exhibits normal muscle tone. Coordination normal.  Skin: Skin is warm. Rash noted.    ED Course  Procedures (including critical care time) Labs Review Labs Reviewed  URINALYSIS, ROUTINE W REFLEX MICROSCOPIC - Abnormal; Notable for the following:    Protein, ur 30 (*)    All other components within normal limits  CBC WITH DIFFERENTIAL - Abnormal; Notable for the following:    WBC 3.7 (*)    RBC 2.94 (*)    Hemoglobin 9.4 (*)    HCT 29.2 (*)    RDW 19.2 (*)    Lymphs Abs 0.6 (*)    Monocytes Relative 14 (*)    All other components within normal limits  COMPREHENSIVE METABOLIC PANEL - Abnormal; Notable for the following:    BUN 63 (*)    Creatinine, Ser 2.20 (*)    Albumin 2.4 (*)    AST 45 (*)    GFR calc non Af Amer 18 (*)    GFR calc Af Amer 21 (*)    All other components within normal limits  APTT - Abnormal; Notable for the following:    aPTT 62 (*)    All other components within normal limits  LIPASE, BLOOD - Abnormal; Notable for the following:    Lipase 68 (*)    All  other components within normal limits  PRO B NATRIURETIC PEPTIDE - Abnormal; Notable for the following:    Pro B  Natriuretic peptide (BNP) 10117.0 (*)    All other components within normal limits  URINE CULTURE  PROTIME-INR  CK  URINE MICROSCOPIC-ADD ON  CG4 I-STAT (LACTIC ACID)  POCT I-STAT TROPONIN I   Imaging Review Dg Chest Port 1 View  07/27/2013   CLINICAL DATA:  Chest pain  EXAM: PORTABLE CHEST - 1 VIEW  COMPARISON:  01/13/2013  FINDINGS: Cardiomegaly is noted. Central mild vascular congestion without pulmonary edema. Question small right pleural effusion with right basilar atelectasis or infiltrate. Mild left basilar atelectasis. Old right rib fractures.  IMPRESSION: Central mild vascular congestion without pulmonary edema. Question small right pleural effusion with right basilar atelectasis or infiltrate. Mild left basilar atelectasis.   Electronically Signed   By: Natasha Mead M.D.   On: 07/27/2013 11:51    EKG Interpretation     Ventricular Rate:  60 PR Interval:    QRS Duration: 106 QT Interval:  443 QTC Calculation: 443 R Axis:   -81 Text Interpretation:  Atrial fibrillation Left anterior fascicular block Abnormal R-wave progression, late transition Borderline T wave abnormalities , new Abnormal ECG           ra sat is 94% and I interpret to be adequate    1:24 PM Pt unable to stand up due to overall weakness.  No UTI, troponin is neg. Mild effusions on CXR< BNP is >10,000 suggesting some acute CHF and may be contributing to weakness.  Will need more home health resources.  Family is here and they stop by and see pt almost daily to help out, but there is no formal home health.  Will continue gently IVF's and discuss with consultant for admission to unassigned medicine.    MDM   1. CHF (congestive heart failure)   2. Dehydration   3. Rash   4. Renal insufficiency       Pt with generalized weakness, no sig complaints, has mild extremity pain, vague abd pain with no guard or rebound on exam.  No fever.  VS seem stable.  Will check labs, urine, CK, CXR due to cough and  check pulses by doppler in right foot.  May be diminshed and difficult to palpate due to skin changes.  Rash is not hot to touch and may be more related to chronic condition such as eczema or psoriasis.      Gavin Pound. Luis Nickles, MD 07/27/13 1326

## 2013-07-27 NOTE — ED Notes (Signed)
IV FLUIDS STOPPED PER ORDER

## 2013-07-27 NOTE — Plan of Care (Signed)
Problem: Phase I Progression Outcomes Goal: Pain controlled with appropriate interventions Repositioned prn.

## 2013-07-27 NOTE — H&P (Signed)
History and Physical       Hospital Admission Note Date: 07/27/2013  Patient name: Emily Peters Medical record number: [id number] Date of birth: 12/10/1914 Age: 79 y.o. Gender: female PCP: Fredderick Severance, MD  Primary cardiologist: Dr. Patty Sermons  Chief Complaint:  Generalized weakness and "not herself in the last 2 days"  HPI: Patient is a 79 year old female with multiple medical problems including hypothyroidism, hypertension, dementia, chronic systolic CHF (unknown EF), cataracts, GERD, chronic renal insufficiency who lives alone except her grandson stays with her at night, was brought to the ER by her son for the above complaints. Patient was not able to provide any history herself due to dementia and hearing deficit. Per patient's son, she ambulates with a walker, and had been in her baseline health until last 2 days when she has been feeling very weak, not getting up from the bed. Patient has physical therapist and home health aid who also comes to the house for bathing. Patient's son reported that she had no nausea, vomiting. She may have had slight diarrhea, has been eating, no fevers or chills or headaches. Patient had not reported any chest pain or shortness of breath to him. He also reported that she has fallen recently as "her legs give away" and had been complaining of hip pain and knee pain. Patient follows wound care center for her lower extremity edema and wounds.  ER workup showed a creatinine of 2.2, BUN 63, BNP 10117  Review of Systems:  Patient has dementia and hearing deficit, unable to provide a review of system herself. History was provided by patient's son in the room as dictated above in history of present illness  Past Medical History: Past Medical History  Diagnosis Date  . PAC (premature atrial contraction)   . Thyroid disease   . Hypertension   . Dementia   . Cataract   . Acid reflux   . CHF (congestive heart  failure)   . Renal disorder   . Hypothyroidism   . Arthritis   . Peripheral vascular disease    Past Surgical History  Procedure Laterality Date  . Abdominal hysterectomy    . Eye surgery    . Esophageal dilation      x 2    Medications: Prior to Admission medications   Medication Sig Start Date End Date Taking? Authorizing Provider  amLODipine (NORVASC) 10 MG tablet Take 10 mg by mouth daily.   Yes Historical Provider, MD  aspirin 81 MG tablet Take 81 mg by mouth daily.   Yes Historical Provider, MD  cholecalciferol (VITAMIN D) 1000 UNITS tablet Take 2,000 Units by mouth daily.    Yes Historical Provider, MD  Cyanocobalamin (VITAMIN B-12 IJ) Inject 1 application as directed every 30 (thirty) days.    Yes Historical Provider, MD  donepezil (ARICEPT) 10 MG tablet Take 10 mg by mouth at bedtime.   Yes Historical Provider, MD  ferrous sulfate 325 (65 FE) MG tablet Take 1 tablet (325 mg total) by mouth daily. 01/13/13  Yes Donnetta Hutching, MD  furosemide (LASIX) 40 MG tablet Take 40 mg by mouth 2 (two) times daily.   Yes Historical Provider, MD  hydrALAZINE (APRESOLINE) 25 MG tablet Take 25 mg by mouth 2 (two) times daily.   Yes Historical Provider, MD  levothyroxine (SYNTHROID, LEVOTHROID) 100 MCG tablet Take 100 mcg by mouth daily before breakfast.   Yes Historical Provider, MD  metoprolol (LOPRESSOR) 50 MG tablet Take 50 mg by mouth 2 (two) times daily.  Yes Historical Provider, MD  omeprazole (PRILOSEC) 40 MG capsule Take 40 mg by mouth daily.   Yes Historical Provider, MD  ranitidine (ZANTAC) 150 MG tablet Take 150 mg by mouth daily.   Yes Historical Provider, MD  Skin Protectants, Misc. (EUCERIN) cream Apply 1 application topically 3 (three) times daily as needed for dry skin.    Yes Historical Provider, MD  triamcinolone ointment (KENALOG) 0.1 % Apply 1 application topically 2 (two) times daily as needed (for rash).   Yes Historical Provider, MD    Allergies:  No Known  Allergies  Social History:  Per her chart, reports that she has never smoked. She has never used smokeless tobacco. She reports that she does not drink alcohol or use illicit drugs Family History: Family History  Problem Relation Age of Onset  . Heart failure    . Heart attack      Physical Exam: Blood pressure 114/59, pulse 101, temperature 99.6 F (37.6 C), temperature source Oral, resp. rate 18, SpO2 100.00%. General: Alert, awake,  in no acute distress. HEENT: normocephalic, atraumatic, anicteric sclera, pink conjunctiva, pupils equal and reactive to light and accomodation, oropharynx clear Neck: supple, no masses or lymphadenopathy, no goiter, no bruits  Heart: Irregularly irregular rhythm. Lungs: Clear to auscultation bilaterally, no wheezing, rales or rhonchi. Abdomen: Soft, nontender, nondistended, positive bowel sounds, no masses. Extremities: No clubbing, cyanosis with positive pedal pulses. Bilateral lower extremity erythema, excoriated, ecchymosis, patient's son reports that her legs are improving, she follows in the wound care center  Neuro: Grossly intact, no focal neurological deficits, strength 5/5 upper and lower extremities bilaterally, patient complained of right hip pain on lifting her right leg Psych: alert and oriented x 3, normal mood and affect Skin: no rashes or lesions, warm and dry   LABS on Admission:  Basic Metabolic Panel:  Recent Labs Lab 07/27/13 1130  NA 139  K 4.1  CL 105  CO2 25  GLUCOSE 95  BUN 63*  CREATININE 2.20*  CALCIUM 8.8   Liver Function Tests:  Recent Labs Lab 07/27/13 1130  AST 45*  ALT 27  ALKPHOS 110  BILITOT 0.4  PROT 8.3  ALBUMIN 2.4*    Recent Labs Lab 07/27/13 1130  LIPASE 68*   No results found for this basename: AMMONIA,  in the last 168 hours CBC:  Recent Labs Lab 07/27/13 1130  WBC 3.7*  NEUTROABS 2.5  HGB 9.4*  HCT 29.2*  MCV 99.3  PLT 207   Cardiac Enzymes:  Recent Labs Lab  07/27/13 1130  CKTOTAL 35   BNP: No components found with this basename: POCBNP,  CBG: No results found for this basename: GLUCAP,  in the last 168 hours   Radiological Exams on Admission: Dg Chest Port 1 View  07/27/2013   CLINICAL DATA:  Chest pain  EXAM: PORTABLE CHEST - 1 VIEW  COMPARISON:  01/13/2013  FINDINGS: Cardiomegaly is noted. Central mild vascular congestion without pulmonary edema. Question small right pleural effusion with right basilar atelectasis or infiltrate. Mild left basilar atelectasis. Old right rib fractures.  IMPRESSION: Central mild vascular congestion without pulmonary edema. Question small right pleural effusion with right basilar atelectasis or infiltrate. Mild left basilar atelectasis.   Electronically Signed   By: Natasha Mead M.D.   On: 07/27/2013 11:51    Assessment/Plan Principal Problem:   Acute renal insufficiency on chronic renal disease stage III, baseline creatinine 1.8 in September 2013, currently 2 creatinine 2.2 the BNP of 09811. Patient is  borderline hypotensive - Admit to telemetry, obtain serial cardiac enzymes, hold Lasix, amlodipine, metoprolol, hydralazine  - Patient has already received 500 cc of fluid bolus in the ER, due to her history of CHF, hold IV fluids, will monitor creatinine closely    Active Problems:   HYPOTHYROIDISM -Obtain TSH, continue Synthroid     DEMENTIA WITH FAILURE TO THRIVE  -Patient may need higher level of care at this point     HYPERTENSION: Currently borderline hypotensive, hold off on Lasix, Klonopin, metoprolol, hydralazine     CHF, chronic: EF unknown  - Patient does not appear to be in florid heart failure, elevated BNP could be due to acute on chronic renal disease  - Obtain 2-D echocardiogram, hold Lasix for now, cardiology consult       Right hip pain - Placed on pain control, right hip x-rays     Peripheral vascular disease - Wound care consult   DVT prophylaxis:  heparin subcutaneous   CODE  STATUS:  Family Communication: Admission, patients condition and plan of care including tests being ordered have been discussed with the patient and  son  who indicates understanding and agree with the plan and Code Status   Further plan will depend as patient's clinical course evolves and further radiologic and laboratory data become available.   Time Spent on Admission:  1 hour  Demeshia Sherburne M.D. [facility] 07/27/2013, 3:13 PM Pager: [phone]  If 7PM-7AM, please contact night-coverage [email or web address] Password [email or web address]

## 2013-07-27 NOTE — Consult Note (Signed)
CARDIOLOGY CONSULT NOTE   Patient ID: Emily Peters MRN: [id number] DOB/AGE: [birth year] 79 y.o.  Admit date: 07/27/2013  Primary Physician   Fredderick Severance, MD Primary Cardiologist   Cassell Clement, MD Reason for Consultation   CHF  HPI: Emily Peters is a 79 year old woman with PMH of HTN, hypothyroidism, advanced dementia, GERD, CKD stage 3, rate controlled Atrial fibrilation, with normal Myoview scan in 2007 who presents to the ED with generalized weakness. She is found to have a proBNP elevated to 10,117 with mild vascular congestion but no pulmonary edema per CXR. She has no prior history of heart failure. Her baseline creatinine appears to be in the 1.5-1.8 range but her creatinine is elevated to 2.2 with BUN of 63.  She is orthostatic and has complained of feeling lightheaded at home. Per her son, who provided most of the information contained in this HPI, she has had no recent falls, dyspnea, nausea, vomiting, or complaints of chest pain.    Past Medical History  Diagnosis Date  . PAC (premature atrial contraction)   . Thyroid disease   . Hypertension   . Dementia   . Cataract   . Acid reflux   . CHF (congestive heart failure)   . Renal disorder   . Hypothyroidism   . Arthritis   . Peripheral vascular disease      Past Surgical History  Procedure Laterality Date  . Abdominal hysterectomy    . Eye surgery    . Esophageal dilation      x 2    No Known Allergies  I have reviewed the patient's current medications       Prior to Admission medications   Medication Sig Start Date End Date Taking? Authorizing Provider  amLODipine (NORVASC) 10 MG tablet Take 10 mg by mouth daily.   Yes Historical Provider, MD  aspirin 81 MG tablet Take 81 mg by mouth daily.   Yes Historical Provider, MD  cholecalciferol (VITAMIN D) 1000 UNITS tablet Take 2,000 Units by mouth daily.    Yes Historical Provider, MD  Cyanocobalamin (VITAMIN B-12 IJ) Inject 1 application as directed  every 30 (thirty) days.    Yes Historical Provider, MD  donepezil (ARICEPT) 10 MG tablet Take 10 mg by mouth at bedtime.   Yes Historical Provider, MD  ferrous sulfate 325 (65 FE) MG tablet Take 1 tablet (325 mg total) by mouth daily. 01/13/13  Yes Donnetta Hutching, MD  furosemide (LASIX) 40 MG tablet Take 40 mg by mouth 2 (two) times daily.   Yes Historical Provider, MD  hydrALAZINE (APRESOLINE) 25 MG tablet Take 25 mg by mouth 2 (two) times daily.   Yes Historical Provider, MD  levothyroxine (SYNTHROID, LEVOTHROID) 100 MCG tablet Take 100 mcg by mouth daily before breakfast.   Yes Historical Provider, MD  metoprolol (LOPRESSOR) 50 MG tablet Take 50 mg by mouth 2 (two) times daily.   Yes Historical Provider, MD  omeprazole (PRILOSEC) 40 MG capsule Take 40 mg by mouth daily.   Yes Historical Provider, MD  ranitidine (ZANTAC) 150 MG tablet Take 150 mg by mouth daily.   Yes Historical Provider, MD  Skin Protectants, Misc. (EUCERIN) cream Apply 1 application topically 3 (three) times daily as needed for dry skin.    Yes Historical Provider, MD  triamcinolone ointment (KENALOG) 0.1 % Apply 1 application topically 2 (two) times daily as needed (for rash).   Yes Historical Provider, MD     History  Social History  . Marital Status: Widowed    Spouse Name: N/A    Number of Children: N/A  . Years of Education: N/A   Occupational History  . Not on file.   Social History Main Topics  . Smoking status: Never Smoker   . Smokeless tobacco: Never Used  . Alcohol Use: No  . Drug Use: No  . Sexual Activity: No   Other Topics Concern  . Not on file   Social History Narrative  . No narrative on file    No family status information on file.   Family History  Problem Relation Age of Onset  . Heart failure    . Heart attack       ROS: Except for for the information provided by her son noted in the HPI, unable to obtain due to the patients dementia and hearing impairment.   Physical Exam: Blood  pressure 120/68, pulse 65, temperature 99.6 F (37.6 C), temperature source Oral, resp. rate 16, SpO2 98.00%.  General: Well noursihed elderly female in no acute distress. Hard of hearing.  Head: Dry MM.   Lungs: Bibasilar fine crackles, no respiratory distress. Heart: Irregularly irregular rate and rhythm, S1 S2, no rub/gallop, DP and PT pulses are 2+ equally and bilaterally.   Neck: No carotid bruits. No lymphadenopathy.  JVP 10-12 cm Abdomen: Bowel sounds present, abdomen soft and non-tender without masses or hernias noted. Extremities: No clubbing or cyanosis.  1+ pitting edema bilaterally up to her midshins.  Neuro: Alert, pleasantly demented. No focal deficits noted. Psych:  Good affect.   Labs:   Lab Results  Component Value Date   WBC 3.7* 07/27/2013   HGB 9.4* 07/27/2013   HCT 29.2* 07/27/2013   MCV 99.3 07/27/2013   PLT 207 07/27/2013    Recent Labs  07/27/13 1130  INR 1.09    Recent Labs Lab 07/27/13 1130  NA 139  K 4.1  CL 105  CO2 25  BUN 63*  CREATININE 2.20*  CALCIUM 8.8  PROT 8.3  BILITOT 0.4  ALKPHOS 110  ALT 27  AST 45*  GLUCOSE 95  ALBUMIN 2.4*   No results found for this basename: mg    Recent Labs  07/27/13 1130  CKTOTAL 35    Recent Labs  07/27/13 1157  TROPIPOC 0.01   Pro B Natriuretic peptide (BNP)  Date/Time Value Range Status  07/27/2013 11:30 AM 10117.0* 0 - 450 pg/mL Final   Lab Results  Component Value Date   CHOL  Value: 72        ATP III CLASSIFICATION:  <200     mg/dL   Desirable  161-096  mg/dL   Borderline High  >=045    mg/dL   High        4/0/9811   HDL 14* 12/25/2008   LDLCALC  Value: 47        Total Cholesterol/HDL:CHD Risk Coronary Heart Disease Risk Table                     Men   Women  1/2 Average Risk   3.4   3.3  Average Risk       5.0   4.4  2 X Average Risk   9.6   7.1  3 X Average Risk  23.4   11.0        Use the calculated Patient Ratio above and the CHD Risk Table to determine the patient's CHD Risk.  ATP III CLASSIFICATION (LDL):  <100     mg/dL   Optimal  161-096  mg/dL   Near or Above                    Optimal  130-159  mg/dL   Borderline  045-409  mg/dL   High  >811     mg/dL   Very High 05/22/4781   TRIG 56 12/25/2008   Lab Results  Component Value Date   DDIMER  Value: 10.08        AT THE INHOUSE ESTABLISHED CUTOFF VALUE OF 0.48 ug/mL FEU, THIS ASSAY HAS BEEN DOCUMENTED IN THE LITERATURE TO HAVE A SENSITIVITY AND NEGATIVE PREDICTIVE VALUE OF AT LEAST 98 TO 99%.  THE TEST RESULT SHOULD BE CORRELATED WITH AN ASSESSMENT OF THE CLINICAL PROBABILITY OF DVT / VTE.* 12/25/2008   Lipase  Date/Time Value Range Status  07/27/2013 11:30 AM 68* 11 - 59 U/L Final   TSH  Date/Time Value Range Status  07/31/2009  8:42 PM 0.108* 0.350-4.500 microintl units/mL Final     See lab report for associated comment(s)   Vitamin B-12  Date/Time Value Range Status  06/17/2009 10:40 PM 1858* 211-911 pg/mL Final     Folate  Date/Time Value Range Status  02/11/2009 11:54 AM 7.1   Final     See lab report for associated comment(s)    Echo: Pending.  ECG:  A. Fib, poor R wave progression, T wave flattening in inferiolateral leads. Similar to previous tracings.  Radiology:  Dg Chest Port 1 View  07/27/2013   CLINICAL DATA:  Chest pain  EXAM: PORTABLE CHEST - 1 VIEW  COMPARISON:  01/13/2013  FINDINGS: Cardiomegaly is noted. Central mild vascular congestion without pulmonary edema. Question small right pleural effusion with right basilar atelectasis or infiltrate. Mild left basilar atelectasis. Old right rib fractures.  IMPRESSION: Central mild vascular congestion without pulmonary edema. Question small right pleural effusion with right basilar atelectasis or infiltrate. Mild left basilar atelectasis.   Electronically Signed   By: Natasha Mead M.D.   On: 07/27/2013 11:51    ASSESSMENT AND PLAN:   Principal Problem:   Acute renal insufficiency Active Problems:   HYPOTHYROIDISM   DEMENTIA   HYPERTENSION   RENAL  INSUFFICIENCY, CHRONIC   Failure to thrive   CHF, chronic   Right hip pain   Peripheral vascular disease  1. A. Fib, vascular congestion, orthostatic hypotension with generalized weakness: She had orthostatic hypotension on presentation with no signs of frank hypervolemia on physical exam. Elevated pro-BNP could be secondary to acute on chronic renal failure although there is no prior BNP value for comparisson. Agree with 2D echo. Would continue holding Lasix in setting of worsening Cr and GFR. Agree with holding antihypertensives at this time. TSH being checked. Repeat EKG in AM.   2. Acute on chronic renal failure. Agree with repeat BMET and judicious use of fluids. Would monitor urine output.    Signed: Ky Barban, MD IM, PGY-II 07/27/2013 3:40 PM   Patient seen with resident, agree with the above note.  1. Chronic CHF: ?Systolic versus diastolic.  She is volume overloaded on exam with elevated JVP and peripheral edema as well as some evidence for vascular congestion on CXR.  She is orthostatic, however, with presumed AKI on CKD.  She got IV fluid in the ER.   - Avoid further IV fluid.   - If creatinine has improved tomorrow, would restart Lasix 40 mg po  bid.  - Will get echocardiogram.  - Would not follow cardiac enzymes.  2. Atrial fibrillation: Chronic.  She has not been anticoagulated due to fall risk.  HR is controlled currently on metoprolol.  Given orthostasis, would decrease metoprolol to 12.5 mg every 8 hrs for now and follow rate control.  3. Orthostatic hypotension: Stop amlodipine and hydralazine, cut down dose of metoprolol.  4. Presumed AKI on CKD: Possible due to hypoperfusion with orthostatic hypotension in the setting of multiple antihypertensives and ongoing diuretic use.  Agree with hold diuretic until creatinine starts to trend down and BP higher but would not give further IV fluid.    Marca Ancona 07/27/2013 5:06 PM

## 2013-07-28 ENCOUNTER — Inpatient Hospital Stay (HOSPITAL_COMMUNITY): Payer: Medicare Other

## 2013-07-28 DIAGNOSIS — I369 Nonrheumatic tricuspid valve disorder, unspecified: Secondary | ICD-10-CM

## 2013-07-28 LAB — CBC
HCT: 27.6 % — ABNORMAL LOW (ref 36.0–46.0)
Hemoglobin: 8.6 g/dL — ABNORMAL LOW (ref 12.0–15.0)
MCH: 31.2 pg (ref 26.0–34.0)
MCHC: 31.2 g/dL (ref 30.0–36.0)
MCV: 100 fL (ref 78.0–100.0)
Platelets: 212 10*3/uL (ref 150–400)
RBC: 2.76 MIL/uL — ABNORMAL LOW (ref 3.87–5.11)
RDW: 19.4 % — ABNORMAL HIGH (ref 11.5–15.5)
WBC: 4.5 10*3/uL (ref 4.0–10.5)

## 2013-07-28 LAB — URINE CULTURE
Colony Count: NO GROWTH
Culture: NO GROWTH

## 2013-07-28 LAB — BASIC METABOLIC PANEL
CO2: 23 mEq/L (ref 19–32)
Calcium: 8.3 mg/dL — ABNORMAL LOW (ref 8.4–10.5)
Chloride: 107 mEq/L (ref 96–112)
Creatinine, Ser: 2.02 mg/dL — ABNORMAL HIGH (ref 0.50–1.10)
GFR calc Af Amer: 23 mL/min — ABNORMAL LOW (ref >90)
GFR calc non Af Amer: 20 mL/min — ABNORMAL LOW (ref >90)
Glucose, Bld: 86 mg/dL (ref 70–99)
Potassium: 4.4 mEq/L (ref 3.5–5.1)
Sodium: 139 mEq/L (ref 135–145)

## 2013-07-28 LAB — TSH: TSH: 5.006 u[IU]/mL — ABNORMAL HIGH (ref 0.350–4.500)

## 2013-07-28 MED ORDER — HYDROCERIN EX CREA
TOPICAL_CREAM | Freq: Every day | CUTANEOUS | Status: DC
  Administered 2013-07-28 – 2013-07-31 (×4): via TOPICAL
  Filled 2013-07-28: qty 113

## 2013-07-28 MED ORDER — FUROSEMIDE 40 MG PO TABS
40.0000 mg | ORAL_TABLET | Freq: Two times a day (BID) | ORAL | Status: DC
  Administered 2013-07-28 – 2013-07-29 (×3): 40 mg via ORAL
  Filled 2013-07-28 (×6): qty 1

## 2013-07-28 NOTE — Evaluation (Signed)
Occupational Therapy Evaluation Patient Details Name: Emily Peters MRN: [id number] DOB: 01-Jan-1915 Today's Date: 07/28/2013 Time: 8295-6213 OT Time Calculation (min): 22 min  OT Assessment / Plan / Recommendation History of present illness [name] is a 79 year old woman with PMH of HTN, hypothyroidism, advanced dementia, GERD, CKD stage 3, rate controlled Atrial fibrilation, with normal Myoview scan in 2007 who presents to the ED with generalized weakness. She is found to have a proBNP elevated to 10,117 with mild vascular congestion but no pulmonary edema per CXR. She has no prior history of heart failure. Her baseline creatinine appears to be in the 1.5-1.8 range but her creatinine is elevated to 2.2 with BUN of 63.  She is orthostatic and has complained of feeling lightheaded at home. Per her son, who provided most of the information contained in this HPI, she has had no recent falls, dyspnea, nausea, vomiting, or complaints of chest pain.    Clinical Impression   Pt demos decline in function with ADLs and ADL mobility with decreased strength, balance and activity tolerance. Pt demos B UE/shoulder AROM deficits that she states " has been this way for awhile " and pain in R knee limiting her mobility. Pt very HOH, but is pleasant and cooperative. Pt would benefit from acute OT services to address impairments to increase level of function and safety    OT Assessment  Patient needs continued OT Services    Follow Up Recommendations  SNF;Supervision/Assistance - 24 hour    Barriers to Discharge Decreased caregiver support Pt at home alone during the day  Equipment Recommendations  None recommended by OT;Other (comment) (TBD)    Recommendations for Other Services    Frequency  Min 2X/week    Precautions / Restrictions Precautions Precautions: Fall Restrictions Weight Bearing Restrictions: No   Pertinent Vitals/Pain Pt did not rate pain in R knee, just stating that it hurts    ADL  Grooming: Performed;Wash/dry hands;Wash/dry face;Minimal assistance Where Assessed - Grooming: Supported sitting Upper Body Bathing: Simulated;Moderate assistance Lower Body Bathing: Maximal assistance Where Assessed - Lower Body Bathing: Supported sitting Upper Body Dressing: Performed;Moderate assistance Where Assessed - Upper Body Dressing: Supported sitting Lower Body Dressing: +1 Total assistance Transfers/Ambulation Related to ADLs: pt c/o pain in R knee and unable to attempt sit - stand or  transfers this session ADL Comments: Pt requires assist at home with ADLs but uncertain how much as pt not able to give definitive info    OT Diagnosis: Generalized weakness  OT Problem List: Decreased strength;Decreased activity tolerance;Impaired balance (sitting and/or standing);Pain;Decreased range of motion OT Treatment Interventions: Self-care/ADL training;Therapeutic exercise;Patient/family education;Neuromuscular education;Balance training;Therapeutic activities;DME and/or AE instruction   OT Goals(Current goals can be found in the care plan section) Acute Rehab OT Goals Patient Stated Goal: " I wanna get well and go home " OT Goal Formulation: With patient Time For Goal Achievement: 08/04/13 Potential to Achieve Goals: Fair ADL Goals Pt Will Perform Grooming: with min guard assist;with supervision;with set-up;sitting Pt Will Perform Upper Body Bathing: with min assist;sitting Pt Will Perform Lower Body Bathing: with mod assist;sitting/lateral leans Pt Will Perform Upper Body Dressing: with min assist;sitting Pt Will Transfer to Toilet: with total assist;with max assist;bedside commode Additional ADL Goal #1: Pt will perform bed mobility with max - mod A to sit EOB for grooming and ADL tasks  Visit Information  Last OT Received On: 07/28/13 Assistance Needed: +2 History of Present Illness: [name] is a 79 year old woman with  PMH of HTN, hypothyroidism, advanced dementia, GERD, CKD  stage 3, rate controlled Atrial fibrilation, with normal Myoview scan in 2007 who presents to the ED with generalized weakness. She is found to have a proBNP elevated to 10,117 with mild vascular congestion but no pulmonary edema per CXR. She has no prior history of heart failure. Her baseline creatinine appears to be in the 1.5-1.8 range but her creatinine is elevated to 2.2 with BUN of 63.  She is orthostatic and has complained of feeling lightheaded at home. Per her son, who provided most of the information contained in this HPI, she has had no recent falls, dyspnea, nausea, vomiting, or complaints of chest pain.        Prior Functioning     Home Living Family/patient expects to be discharged to:: Private residence Living Arrangements: Alone;Other (Comment) (grandson stays with at night) Available Help at Discharge: Family Type of Home: House Home Layout: One level Home Equipment: Environmental consultant - 2 wheels;Cane - single point;Shower seat Additional Comments: all info provided by pt, n famly present to confirm and or provide info Prior Function Level of Independence: Needs assistance Gait / Transfers Assistance Needed: pt states that she  uses cane and walker in the house and that her son/grandson helps her get OOB everyday ADL's / Homemaking Assistance Needed: HH aide 3/wk for bathing and dressing, grandson/son helps other days. Pt gets Meals on Wheels Communication Communication: HOH Dominant Hand: Right         Vision/Perception Vision - History Baseline Vision: Wears glasses all the time Patient Visual Report: No change from baseline Perception Perception: Within Functional Limits   Cognition  Cognition Arousal/Alertness: Awake/alert Behavior During Therapy: WFL for tasks assessed/performed Overall Cognitive Status: No family/caregiver present to determine baseline cognitive functioning    Extremity/Trunk Assessment Upper Extremity Assessment Upper Extremity Assessment:  Generalized weakness;RUE deficits/detail;LUE deficits/detail RUE Deficits / Details: decreased  AROM/shoulder flexion LUE Deficits / Details: decreased  AROM/shoulder flexion Lower Extremity Assessment Lower Extremity Assessment: Defer to PT evaluation     Mobility Bed Mobility Bed Mobility: Supine to Sit;Sitting - Scoot to [place] of Bed;Sit to Supine;Scooting to [facility] Supine to Sit: 2: Max assist;HOB elevated Sitting - Scoot to [place] of Bed: With rail;3: Mod assist Sit to Supine: 1: +1 Total assist Scooting to HOB: 1: +2 Total assist Scooting to [facility] Patient Percentage: 0% Transfers Transfers: Not assessed Details for Transfer Assistance: pt c/o pain in R knee and unable to attempt sit - stand or  transfers this session     Exercise     Balance Balance Balance Assessed: Yes Static Sitting Balance Static Sitting - Balance Support: Feet supported;No upper extremity supported Static Sitting - Level of Assistance: 4: Min assist Dynamic Sitting Balance Dynamic Sitting - Balance Support: No upper extremity supported;During functional activity Dynamic Sitting - Level of Assistance: 3: Mod assist   End of Session OT - End of Session Activity Tolerance: Patient limited by fatigue;Patient limited by pain Patient left: in bed;with call bell/phone within reach  GO     Galen Manila 07/28/2013, 12:35 PM

## 2013-07-28 NOTE — Progress Notes (Signed)
[facility]  Patient Status: Active (receiving services up to time of hospitalization)  [facility] is providing the following services: RN, PT and HHA  If patient discharges after hours, please call [phone].   Emily Peters 07/28/2013, 4:28 PM

## 2013-07-28 NOTE — Progress Notes (Signed)
CARDIOLOGY CONSULT NOTE   Patient ID: ANDERSEN MCKIVER MRN: [id number] DOB/AGE: 1915/04/08 79 y.o.  Admit date: 07/27/2013  Primary Physician   Fredderick Severance, MD Primary Cardiologist   Cassell Clement, MD Reason for Consultation   CHF  HPI: Ms. Bridgett is a 79 year old woman with PMH of HTN, hypothyroidism, advanced dementia, GERD, CKD stage 3, rate controlled Atrial fibrilation, with normal Myoview scan in 2007 who presents to the ED with generalized weakness. She is found to have a proBNP elevated to 10,117 with mild vascular congestion but no pulmonary edema per CXR. She has no prior history of heart failure. Her baseline creatinine appears to be in the 1.5-1.8 range but her creatinine is elevated to 2.2 with BUN of 63.  She is orthostatic and has complained of feeling lightheaded at home. Per her son, who provided most of the information contained in this HPI, she has had no recent falls, dyspnea, nausea, vomiting, or complaints of chest pain.    Past Medical History  Diagnosis Date  . PAC (premature atrial contraction)   . Thyroid disease   . Hypertension   . Dementia   . Cataract   . Acid reflux   . CHF (congestive heart failure)   . Renal disorder   . Hypothyroidism   . Arthritis   . Peripheral vascular disease      Past Surgical History  Procedure Laterality Date  . Abdominal hysterectomy    . Eye surgery    . Esophageal dilation      x 2    No Known Allergies  I have reviewed the patient's current medications   acetaminophen, acetaminophen, HYDROcodone-acetaminophen, HYDROmorphone (DILAUDID) injection, ondansetron (ZOFRAN) IV, ondansetron  Prior to Admission medications   Medication Sig Start Date End Date Taking? Authorizing Provider  amLODipine (NORVASC) 10 MG tablet Take 10 mg by mouth daily.   Yes Historical Provider, MD  aspirin 81 MG tablet Take 81 mg by mouth daily.   Yes Historical Provider, MD  cholecalciferol (VITAMIN D) 1000 UNITS tablet Take  2,000 Units by mouth daily.    Yes Historical Provider, MD  Cyanocobalamin (VITAMIN B-12 IJ) Inject 1 application as directed every 30 (thirty) days.    Yes Historical Provider, MD  donepezil (ARICEPT) 10 MG tablet Take 10 mg by mouth at bedtime.   Yes Historical Provider, MD  ferrous sulfate 325 (65 FE) MG tablet Take 1 tablet (325 mg total) by mouth daily. 01/13/13  Yes Donnetta Hutching, MD  furosemide (LASIX) 40 MG tablet Take 40 mg by mouth 2 (two) times daily.   Yes Historical Provider, MD  hydrALAZINE (APRESOLINE) 25 MG tablet Take 25 mg by mouth 2 (two) times daily.   Yes Historical Provider, MD  levothyroxine (SYNTHROID, LEVOTHROID) 100 MCG tablet Take 100 mcg by mouth daily before breakfast.   Yes Historical Provider, MD  metoprolol (LOPRESSOR) 50 MG tablet Take 50 mg by mouth 2 (two) times daily.   Yes Historical Provider, MD  omeprazole (PRILOSEC) 40 MG capsule Take 40 mg by mouth daily.   Yes Historical Provider, MD  ranitidine (ZANTAC) 150 MG tablet Take 150 mg by mouth daily.   Yes Historical Provider, MD  Skin Protectants, Misc. (EUCERIN) cream Apply 1 application topically 3 (three) times daily as needed for dry skin.    Yes Historical Provider, MD  triamcinolone ointment (KENALOG) 0.1 % Apply 1 application topically 2 (two) times daily as needed (for rash).   Yes Historical Provider,  MD     History   Social History  . Marital Status: Widowed    Spouse Name: N/A    Number of Children: N/A  . Years of Education: N/A   Occupational History  . Not on file.   Social History Main Topics  . Smoking status: Never Smoker   . Smokeless tobacco: Never Used  . Alcohol Use: No  . Drug Use: No  . Sexual Activity: No   Other Topics Concern  . Not on file   Social History Narrative  . No narrative on file    No family status information on file.   Family History  Problem Relation Age of Onset  . Heart failure    . Heart attack       ROS: Except for for the information provided  by her son noted in the HPI, unable to obtain due to the patients dementia and hearing impairment.   Physical Exam: Blood pressure 125/73, pulse 70, temperature 97.4 F (36.3 C), temperature source Oral, resp. rate 18, height 5\' 2"  (1.575 m), weight 144 lb 13.5 oz (65.7 kg), SpO2 98.00%.  General: Well noursihed elderly female in no acute distress. Hard of hearing.  Head: Dry MM. , markely elevated JVD.  Lungs: inspiratory course rales (  "sound of rushing water" with inspiration) on right mid lung field  Heart: Irregularly irregular rate and rhythm, S1 S2, no rub/gallop, DP and PT pulses are 2+ equally and bilaterally.   Neck: No carotid bruits. No lymphadenopathy.  JVP 10-12 cm Abdomen: Bowel sounds present, abdomen soft and non-tender without masses or hernias noted. Extremities: No clubbing or cyanosis.  1+ pitting edema bilaterally up to her midshins.  Chronic stasis changes. Neuro: Alert, pleasantly demented. No focal deficits noted. Psych:  Good affect.   Labs:   Lab Results  Component Value Date   WBC 4.5 07/28/2013   HGB 8.6* 07/28/2013   HCT 27.6* 07/28/2013   MCV 100.0 07/28/2013   PLT 212 07/28/2013    Recent Labs  07/27/13 1130  INR 1.09     Recent Labs Lab 07/27/13 1130 07/28/13 0505  NA 139 139  K 4.1 4.4  CL 105 107  CO2 25 23  BUN 63* 57*  CREATININE 2.20* 2.02*  CALCIUM 8.8 8.3*  PROT 8.3  --   BILITOT 0.4  --   ALKPHOS 110  --   ALT 27  --   AST 45*  --   GLUCOSE 95 86  ALBUMIN 2.4*  --    No results found for this basename: mg    Recent Labs  07/27/13 1130  CKTOTAL 35    Recent Labs  07/27/13 1157  TROPIPOC 0.01   Pro B Natriuretic peptide (BNP)  Date/Time Value Range Status  07/27/2013 11:30 AM 10117.0* 0 - 450 pg/mL Final   Lab Results  Component Value Date   CHOL  Value: 72        ATP III CLASSIFICATION:  <200     mg/dL   Desirable  829-562  mg/dL   Borderline High  >=130    mg/dL   High        04/27/5783   HDL 14* 12/25/2008    LDLCALC  Value: 47        Total Cholesterol/HDL:CHD Risk Coronary Heart Disease Risk Table                     Men   Women  1/2 Average Risk  3.4   3.3  Average Risk       5.0   4.4  2 X Average Risk   9.6   7.1  3 X Average Risk  23.4   11.0        Use the calculated Patient Ratio above and the CHD Risk Table to determine the patient's CHD Risk.        ATP III CLASSIFICATION (LDL):  <100     mg/dL   Optimal  409-811  mg/dL   Near or Above                    Optimal  130-159  mg/dL   Borderline  914-782  mg/dL   High  >956     mg/dL   Very High 10/22/3084   TRIG 56 12/25/2008   Lab Results  Component Value Date   DDIMER  Value: 10.08        AT THE INHOUSE ESTABLISHED CUTOFF VALUE OF 0.48 ug/mL FEU, THIS ASSAY HAS BEEN DOCUMENTED IN THE LITERATURE TO HAVE A SENSITIVITY AND NEGATIVE PREDICTIVE VALUE OF AT LEAST 98 TO 99%.  THE TEST RESULT SHOULD BE CORRELATED WITH AN ASSESSMENT OF THE CLINICAL PROBABILITY OF DVT / VTE.* 12/25/2008   Lipase  Date/Time Value Range Status  07/27/2013 11:30 AM 68* 11 - 59 U/L Final   TSH  Date/Time Value Range Status  07/31/2009  8:42 PM 0.108* 0.350-4.500 microintl units/mL Final     See lab report for associated comment(s)   Vitamin B-12  Date/Time Value Range Status  06/17/2009 10:40 PM 1858* 211-911 pg/mL Final     Folate  Date/Time Value Range Status  02/11/2009 11:54 AM 7.1   Final     See lab report for associated comment(s)    Echo: Pending.  ECG:  A. Fib, poor R wave progression, T wave flattening in inferiolateral leads. Similar to previous tracings.  Radiology:  Dg Hip Complete Right  07/27/2013   CLINICAL DATA:  Right hip discomfort without history of injury.  EXAM: RIGHT HIP - COMPLETE 2+ VIEW  COMPARISON:  June 02, 2012.  FINDINGS: The proximal femurs and bony pelvis are osteopenic. There is no evidence of an acute hip fracture or dislocation. There is mild symmetric narrowing of both hip joint spaces. No fracture of the visualized portions of  the pelvis is demonstrated. There are degenerative changes of the lower lumbar spine. The soft tissues of the pelvis exhibit no acute abnormalities.  IMPRESSION: There are no findings to suggest an acute fracture of the right hip. There are mild degenerative changes present.   Electronically Signed   By: David  [place]   On: 07/27/2013 16:10   Dg Chest Port 1 View  07/27/2013   CLINICAL DATA:  Chest pain  EXAM: PORTABLE CHEST - 1 VIEW  COMPARISON:  01/13/2013  FINDINGS: Cardiomegaly is noted. Central mild vascular congestion without pulmonary edema. Question small right pleural effusion with right basilar atelectasis or infiltrate. Mild left basilar atelectasis. Old right rib fractures.  IMPRESSION: Central mild vascular congestion without pulmonary edema. Question small right pleural effusion with right basilar atelectasis or infiltrate. Mild left basilar atelectasis.   Electronically Signed   By: Natasha Mead M.D.   On: 07/27/2013 11:51    ASSESSMENT AND PLAN:   Principal Problem:   Acute renal insufficiency Active Problems:   HYPOTHYROIDISM   DEMENTIA   HYPERTENSION   RENAL INSUFFICIENCY, CHRONIC   Failure to thrive   CHF,  chronic   Right hip pain   Peripheral vascular disease   1. Chronic CHF: ?Systolic versus diastolic.  She is volume overloaded on exam with elevated JVP and peripheral edema as well as some evidence for vascular congestion on CXR.  She is orthostatic, however, with presumed AKI on CKD.  She got IV fluid in the ER.   - Avoid further IV fluid.   - If creatinine has improved tomorrow, would restart Lasix 40 mg po bid.  Echo is pending.    2. Atrial fibrillation: Chronic.  She has not been anticoagulated due to fall risk.  HR is controlled currently on metoprolol.  Given orthostasis, would decrease metoprolol to 12.5 mg every 8 hrs for now and follow rate control.  3. Orthostatic hypotension: Stop amlodipine and hydralazine, cut down dose of metoprolol.  4. Presumed AKI on  CKD: Possible due to hypoperfusion with orthostatic hypotension in the setting of multiple antihypertensives and ongoing diuretic use.  Agree with hold diuretic until creatinine starts to trend down and BP higher but would not give further IV fluid.      Elyn Aquas. 07/28/2013 10:18 AM

## 2013-07-28 NOTE — Progress Notes (Signed)
  Echocardiogram 2D Echocardiogram has been performed.  Arvil Chaco 07/28/2013, 3:02 PM

## 2013-07-28 NOTE — Evaluation (Signed)
Physical Therapy Evaluation Patient Details Name: Emily Peters MRN: [id number] DOB: 1915-01-23 Today's Date: 07/28/2013 Time: 3086-5784 PT Time Calculation (min): 16 min  PT Assessment / Plan / Recommendation History of Present Illness  Emily Peters is a 79 year old woman with PMH of HTN, hypothyroidism, advanced dementia, GERD, CKD stage 3, rate controlled Atrial fibrilation, with normal Myoview scan in 2007 who presents to the ED with generalized weakness. She is found to have a proBNP elevated to 10,117 with mild vascular congestion but no pulmonary edema per CXR. She has no prior history of heart failure. Her baseline creatinine appears to be in the 1.5-1.8 range but her creatinine is elevated to 2.2 with BUN of 63.  She is orthostatic and has complained of feeling lightheaded at home. Per her son, who provided most of the information contained in this HPI, she has had no recent falls, dyspnea, nausea, vomiting, or complaints of chest pain.   Clinical Impression  Patient presents with problems listed below. Will benefit from acute PT to maximize independence prior to discharge.  Patient without 24 hour assist, and requiring max to total assist for mobility.  Recommend SNF for continued therapy at discharge.    PT Assessment  Patient needs continued PT services    Follow Up Recommendations  SNF    Does the patient have the potential to tolerate intense rehabilitation      Barriers to Discharge Decreased caregiver support Does not have 24 hour assist    Equipment Recommendations  None recommended by PT    Recommendations for Other Services     Frequency Min 3X/week    Precautions / Restrictions Precautions Precautions: Fall Precaution Comments: Son reports to PT that patient did have a fall on Sunday Restrictions Weight Bearing Restrictions: No   Pertinent Vitals/Pain Pain in RLE impacting mobility      Mobility  Bed Mobility Bed Mobility: Supine to Sit;Sitting - Scoot to [place]  of Bed;Sit to Supine;Scooting to [facility] Supine to Sit: 2: Max assist;HOB elevated Sitting - Scoot to [place] of Bed: 2: Max assist;With rail Sit to Supine: 1: +2 Total assist;HOB elevated Sit to Supine: Patient Percentage: 20% Scooting to HOB: 1: +2 Total assist Scooting to [facility]: Patient Percentage: 0% Details for Bed Mobility Assistance: Verbal and tactile cues for technique.  Max to total assist for all mobility. Transfers Transfers: Sit to Stand;Stand to Sit Sit to Stand: 1: +2 Total assist;With upper extremity assist;From bed Sit to Stand: Patient Percentage: 30% Stand to Sit: 1: +2 Total assist;With upper extremity assist;To bed Stand to Sit: Patient Percentage: 40% Details for Transfer Assistance: Verbal cues for hand placement, and instructed patient to put most of her weight on LLE.  Assist to rise to standing.  Patient stood x 30 seconds with very flexed posture.  Physical cues at hips and verbal cues to stand upright and hold head up. Ambulation/Gait Ambulation/Gait Assistance: Not tested (comment)    Exercises     PT Diagnosis: Difficulty walking;Generalized weakness;Acute pain;Altered mental status  PT Problem List: Decreased strength;Decreased activity tolerance;Decreased mobility;Decreased balance;Decreased cognition;Decreased knowledge of use of DME;Pain PT Treatment Interventions: DME instruction;Gait training;Functional mobility training;Therapeutic exercise;Balance training;Patient/family education     PT Goals(Current goals can be found in the care plan section) Acute Rehab PT Goals Patient Stated Goal: None stated PT Goal Formulation: With patient/family Time For Goal Achievement: 08/11/13 Potential to Achieve Goals: Good  Visit Information  Last PT Received On: 07/28/13 Assistance Needed: +2 History of Present Illness:  Emily Peters is a 79 year old woman with PMH of HTN, hypothyroidism, advanced dementia, GERD, CKD stage 3, rate controlled Atrial fibrilation, with normal  Myoview scan in 2007 who presents to the ED with generalized weakness. She is found to have a proBNP elevated to 10,117 with mild vascular congestion but no pulmonary edema per CXR. She has no prior history of heart failure. Her baseline creatinine appears to be in the 1.5-1.8 range but her creatinine is elevated to 2.2 with BUN of 63.  She is orthostatic and has complained of feeling lightheaded at home. Per her son, who provided most of the information contained in this HPI, she has had no recent falls, dyspnea, nausea, vomiting, or complaints of chest pain.        Prior Functioning  Home Living Family/patient expects to be discharged to:: Skilled nursing facility Living Arrangements: Alone;Other (Comment) (Grandson stays at night; family and aide help during day) Available Help at Discharge: Family Type of Home: House Home Layout: One level Home Equipment: Environmental consultant - 2 wheels;Cane - single point;Shower seat Additional Comments: Info provided by son Prior Function Level of Independence: Independent with assistive device(s);Needs assistance Gait / Transfers Assistance Needed: pt states that she  uses cane and walker in the house and that her son/grandson helps her get OOB everyday ADL's / Homemaking Assistance Needed: HH aide 3/wk for bathing and dressing, grandson/son helps other days. Pt gets Meals on Wheels Communication Communication: HOH Dominant Hand: Right    Cognition  Cognition Arousal/Alertness: Awake/alert Behavior During Therapy: WFL for tasks assessed/performed Overall Cognitive Status: History of cognitive impairments - at baseline Memory: Decreased short-term memory    Extremity/Trunk Assessment Upper Extremity Assessment Upper Extremity Assessment: Defer to OT evaluation RUE Deficits / Details: decreased  AROM/shoulder flexion LUE Deficits / Details: decreased  AROM/shoulder flexion Lower Extremity Assessment Lower Extremity Assessment: Generalized weakness;RLE  deficits/detail;LLE deficits/detail RLE Deficits / Details: Unable to lift RLE off of bed against gravity due to pain per patient.  Noted discoloration lower leg and foot. RLE: Unable to fully assess due to pain LLE Deficits / Details: Strength grossly 3+/5.  Discoloration around ankle and foot   Balance Balance Balance Assessed: Yes Static Sitting Balance Static Sitting - Balance Support: Right upper extremity supported;Feet unsupported Static Sitting - Level of Assistance: 4: Min assist Static Sitting - Comment/# of Minutes: 4 minutes with flexed posture and posterior lean.  Cues to move trunk forward and look up. Dynamic Sitting Balance Dynamic Sitting - Balance Support: No upper extremity supported;During functional activity Dynamic Sitting - Level of Assistance: 3: Mod assist Static Standing Balance Static Standing - Balance Support: Bilateral upper extremity supported Static Standing - Level of Assistance: 3: Mod assist Static Standing - Comment/# of Minutes: 30 seconds with very flexed posture and forward head.  End of Session PT - End of Session Equipment Utilized During Treatment: Gait belt;Oxygen Activity Tolerance: Patient limited by pain;Patient limited by fatigue Patient left: in bed;with call bell/phone within reach;with family/visitor present Nurse Communication: Mobility status;Patient requests pain meds  GP     Vena Austria 07/28/2013, 1:41 PM Durenda Hurt. Renaldo Fiddler, [facility] Acute Rehab Services Pager [phone]

## 2013-07-28 NOTE — Consult Note (Addendum)
WOC wound consult note Reason for Consult: Consult requested for leg wound.  Pt is hard of hearing but reports that she used to go to the wound care center but does not any more.  Assessed bilat legs.  No open wounds or drainage noted.  Dry scaly skin to BLE with darkly pigmented skin. Left large toenail and right second toenail with dark areas; pt could benefit from podiatry consult after discharge.  This service is not available in acute care at [facility].  Dressing procedure/placement/frequency: No topical treatment indicated at this time.  Eucerin cream to treat dry skin. Please re-consult if further assistance is needed.  Thank-you,  Cammie Mcgee MSN, RN, CWOCN, [place], CNS [phone]

## 2013-07-28 NOTE — Progress Notes (Signed)
[facility] PROGRESS NOTE  Emily Peters ZOX:[id number] DOB: 1914-09-30 DOA: 07/27/2013 PCP: Fredderick Severance, MD  HPI: Patient is a 79 year old female with multiple medical problems including hypothyroidism, hypertension, dementia, chronic systolic CHF (unknown EF), cataracts, GERD, chronic renal insufficiency who lives alone except her grandson stays with her at night, was brought to the ER by her son for the above complaints. Patient was not able to provide any history herself due to dementia and hearing deficit. Per patient's son, she ambulates with a walker, and had been in her baseline health until last 2 days when she has been feeling very weak, not getting up from the bed. Patient has physical therapist and home health aid who also comes to the house for bathing. Patient's son reported that she had no nausea, vomiting. She may have had slight diarrhea, has been eating, no fevers or chills or headaches. Patient had not reported any chest pain or shortness of breath to him. He also reported that she has fallen recently as "her legs give away" and had been complaining of hip pain and knee pain. Patient follows wound care center for her lower extremity edema and wounds.  Assessment/Plan: Acute renal insufficiency on chronic renal disease stage III, baseline creatinine 1.8 in September 2013, currently 2 creatinine 2.2 [place] 81[address].  - Admit to telemetry, obtain serial cardiac enzymes, hold amlodipine, metoprolol, hydralazine  - blood pressure stable this morning, CXR with fluid overload, restart Lasix and closely monitor.  HYPOTHYROIDISM  -Obtain TSH, continue Synthroid  DEMENTIA WITH FAILURE TO THRIVE  -Patient may need higher level of care at this point  HYPERTENSION: Currently borderline hypotensive, hold off on Lasix, Klonopin, metoprolol, hydralazine  CHF, chronic: EF unknown  - Patient does not appear to be in florid heart failure, elevated BNP could be due to acute on chronic renal  disease  - Obtain 2-D echocardiogram, hold Lasix for now, cardiology consult  Right hip pain  - Placed on pain control, right hip x-rays  Peripheral vascular disease  - Wound care consult   Diet: heart Fluids: none DVT Prophylaxis: heparin  Code Status: Full Family Communication: son bedside  Disposition Plan: inpatient, home vs SNF when ready  Consultants:  Cardiology  Procedures:  none   Antibiotics  Anti-infectives   None     Antibiotics Given (last 72 hours)   None      HPI/Subjective: Confused, feeling well  Objective: Filed Vitals:   07/27/13 1550 07/27/13 1633 07/27/13 2159 07/28/13 0429  BP:  137/72 125/57 131/56  Pulse:  85 58 79  Temp: 97.6 F (36.4 C) 98 F (36.7 C)  97.1 F (36.2 C)  TempSrc: Oral Oral  Oral  Resp:  19 17 18   Height:  5\' 2"  (1.575 m)    Weight:  66.6 kg (146 lb 13.2 oz)  65.7 kg (144 lb 13.5 oz)  SpO2:  96% 100% 98%    Intake/Output Summary (Last 24 hours) at 07/28/13 0725 Last data filed at 07/27/13 1825  Gross per 24 hour  Intake    220 ml  Output      1 ml  Net    219 ml   Filed Weights   07/27/13 1633 07/28/13 0429  Weight: 66.6 kg (146 lb 13.2 oz) 65.7 kg (144 lb 13.5 oz)   Exam:  General:  NAD  Cardiovascular: regular rate and rhythm, without MRG  Respiratory: difficult exam, crackles  Abdomen: soft, not tender to palpation, positive bowel sounds  MSK:  1 + peripheral edema  Neuro: grossly non focal  Data Reviewed: Basic Metabolic Panel:  Recent Labs Lab 07/27/13 1130 07/28/13 0505  NA 139 139  K 4.1 4.4  CL 105 107  CO2 25 23  GLUCOSE 95 86  BUN 63* 57*  CREATININE 2.20* 2.02*  CALCIUM 8.8 8.3*   Liver Function Tests:  Recent Labs Lab 07/27/13 1130  AST 45*  ALT 27  ALKPHOS 110  BILITOT 0.4  PROT 8.3  ALBUMIN 2.4*    Recent Labs Lab 07/27/13 1130  LIPASE 68*   CBC:  Recent Labs Lab 07/27/13 1130 07/28/13 0505  WBC 3.7* PENDING  NEUTROABS 2.5  --   HGB 9.4* 8.6*   HCT 29.2* 27.6*  MCV 99.3 100.0  PLT 207 PENDING   Cardiac Enzymes:  Recent Labs Lab 07/27/13 1130  CKTOTAL 35   BNP (last 3 results)  Recent Labs  07/27/13 1130  PROBNP 10117.0*   Studies: Dg Hip Complete Right  07/27/2013   CLINICAL DATA:  Right hip discomfort without history of injury.  EXAM: RIGHT HIP - COMPLETE 2+ VIEW  COMPARISON:  June 02, 2012.  FINDINGS: The proximal femurs and bony pelvis are osteopenic. There is no evidence of an acute hip fracture or dislocation. There is mild symmetric narrowing of both hip joint spaces. No fracture of the visualized portions of the pelvis is demonstrated. There are degenerative changes of the lower lumbar spine. The soft tissues of the pelvis exhibit no acute abnormalities.  IMPRESSION: There are no findings to suggest an acute fracture of the right hip. There are mild degenerative changes present.   Electronically Signed   By: David  [place]   On: 07/27/2013 16:10   Dg Chest Port 1 View  07/27/2013   CLINICAL DATA:  Chest pain  EXAM: PORTABLE CHEST - 1 VIEW  COMPARISON:  01/13/2013  FINDINGS: Cardiomegaly is noted. Central mild vascular congestion without pulmonary edema. Question small right pleural effusion with right basilar atelectasis or infiltrate. Mild left basilar atelectasis. Old right rib fractures.  IMPRESSION: Central mild vascular congestion without pulmonary edema. Question small right pleural effusion with right basilar atelectasis or infiltrate. Mild left basilar atelectasis.   Electronically Signed   By: Natasha Mead M.D.   On: 07/27/2013 11:51   Scheduled Meds: . aspirin EC  81 mg Oral Daily  . cholecalciferol  2,000 Units Oral Daily  . donepezil  10 mg Oral QHS  . ferrous sulfate  325 mg Oral Daily  . heparin  5,000 Units Subcutaneous Q8H  . levothyroxine  100 mcg Oral QAC breakfast  . metoprolol tartrate  12.5 mg Oral TID  . pantoprazole  80 mg Oral Daily  . sodium chloride  3 mL Intravenous Q12H   Continuous  Infusions:   Principal Problem:   Acute renal insufficiency Active Problems:   HYPOTHYROIDISM   DEMENTIA   HYPERTENSION   RENAL INSUFFICIENCY, CHRONIC   Failure to thrive   CHF, chronic   Right hip pain   Peripheral vascular disease  Time spent: 35  Pamella Pert, MD [facility] Pager [phone]. If 7 PM - 7 AM, please contact night-coverage at [email or web address], password [facility] 07/28/2013, 7:25 AM  LOS: 1 day

## 2013-07-28 NOTE — Progress Notes (Signed)
Utilization Review Completed Jeran Hiltz J. Lilliona Blakeney, RN, BSN, NCM [phone]  

## 2013-07-29 DIAGNOSIS — I5042 Chronic combined systolic (congestive) and diastolic (congestive) heart failure: Secondary | ICD-10-CM

## 2013-07-29 LAB — BASIC METABOLIC PANEL
BUN: 50 mg/dL — ABNORMAL HIGH (ref 6–23)
Creatinine, Ser: 2.07 mg/dL — ABNORMAL HIGH (ref 0.50–1.10)
GFR calc Af Amer: 22 mL/min — ABNORMAL LOW (ref >90)
GFR calc non Af Amer: 19 mL/min — ABNORMAL LOW (ref >90)
Glucose, Bld: 82 mg/dL (ref 70–99)
Potassium: 4.1 mEq/L (ref 3.5–5.1)

## 2013-07-29 LAB — CBC
HCT: 27.9 % — ABNORMAL LOW (ref 36.0–46.0)
Hemoglobin: 8.6 g/dL — ABNORMAL LOW (ref 12.0–15.0)
MCH: 30.7 pg (ref 26.0–34.0)
MCHC: 30.8 g/dL (ref 30.0–36.0)
MCV: 99.6 fL (ref 78.0–100.0)
RDW: 19.1 % — ABNORMAL HIGH (ref 11.5–15.5)

## 2013-07-29 NOTE — Progress Notes (Addendum)
Patient ID: Emily Peters MRN: [id number] DOB/AGE: 09/22/1914 79 y.o.  Admit date: 07/27/2013  Primary Physician   Fredderick Severance, MD Primary Cardiologist   Cassell Clement, MD Reason for Consultation   CHF  HPI: Emily Peters is a 79 year old woman with PMH of HTN, hypothyroidism, advanced dementia, GERD, CKD stage 3, rate controlled Atrial fibrilation, with normal Myoview scan in 2007 who presents to the ED with generalized weakness. She is found to have a proBNP elevated to 10,117 with mild vascular congestion but no pulmonary edema per CXR. She has no prior history of heart failure. Her baseline creatinine appears to be in the 1.5-1.8 range but her creatinine is elevated to 2.2 with BUN of 63.  She is orthostatic and has complained of feeling lightheaded at home. Per her son, who provided most of the information contained in this HPI, she has had no recent falls, dyspnea, nausea, vomiting, or complaints of chest pain.      Past Medical History  Diagnosis Date  . PAC (premature atrial contraction)   . Thyroid disease   . Hypertension   . Dementia   . Cataract   . Acid reflux   . CHF (congestive heart failure)   . Renal disorder   . Hypothyroidism   . Arthritis   . Peripheral vascular disease      Past Surgical History  Procedure Laterality Date  . Abdominal hysterectomy    . Eye surgery    . Esophageal dilation      x 2    No Known Allergies  I have reviewed the patient's current medications   acetaminophen, acetaminophen, HYDROcodone-acetaminophen, HYDROmorphone (DILAUDID) injection, ondansetron (ZOFRAN) IV, ondansetron  Prior to Admission medications   Medication Sig Start Date End Date Taking? Authorizing Provider  amLODipine (NORVASC) 10 MG tablet Take 10 mg by mouth daily.   Yes Historical Provider, MD  aspirin 81 MG tablet Take 81 mg by mouth daily.   Yes Historical Provider, MD  cholecalciferol (VITAMIN D) 1000 UNITS tablet Take 2,000 Units by mouth daily.     Yes Historical Provider, MD  Cyanocobalamin (VITAMIN B-12 IJ) Inject 1 application as directed every 30 (thirty) days.    Yes Historical Provider, MD  donepezil (ARICEPT) 10 MG tablet Take 10 mg by mouth at bedtime.   Yes Historical Provider, MD  ferrous sulfate 325 (65 FE) MG tablet Take 1 tablet (325 mg total) by mouth daily. 01/13/13  Yes Donnetta Hutching, MD  furosemide (LASIX) 40 MG tablet Take 40 mg by mouth 2 (two) times daily.   Yes Historical Provider, MD  hydrALAZINE (APRESOLINE) 25 MG tablet Take 25 mg by mouth 2 (two) times daily.   Yes Historical Provider, MD  levothyroxine (SYNTHROID, LEVOTHROID) 100 MCG tablet Take 100 mcg by mouth daily before breakfast.   Yes Historical Provider, MD  metoprolol (LOPRESSOR) 50 MG tablet Take 50 mg by mouth 2 (two) times daily.   Yes Historical Provider, MD  omeprazole (PRILOSEC) 40 MG capsule Take 40 mg by mouth daily.   Yes Historical Provider, MD  ranitidine (ZANTAC) 150 MG tablet Take 150 mg by mouth daily.   Yes Historical Provider, MD  Skin Protectants, Misc. (EUCERIN) cream Apply 1 application topically 3 (three) times daily as needed for dry skin.    Yes Historical Provider, MD  triamcinolone ointment (KENALOG) 0.1 % Apply 1 application topically 2 (two) times daily as needed (for rash).   Yes Historical Provider, MD  History   Social History  . Marital Status: Widowed    Spouse Name: N/A    Number of Children: N/A  . Years of Education: N/A   Occupational History  . Not on file.   Social History Main Topics  . Smoking status: Never Smoker   . Smokeless tobacco: Never Used  . Alcohol Use: No  . Drug Use: No  . Sexual Activity: No   Other Topics Concern  . Not on file   Social History Narrative  . No narrative on file    No family status information on file.   Family History  Problem Relation Age of Onset  . Heart failure    . Heart attack       ROS: Except for for the information provided by her son noted in the  HPI, unable to obtain due to the patients dementia and hearing impairment.   Physical Exam: Blood pressure 117/60, pulse 72, temperature 97.2 F (36.2 C), temperature source Oral, resp. rate 16, height 5\' 2"  (1.575 m), weight 145 lb 8.1 oz (66 kg), SpO2 98.00%.  General: Well noursihed elderly female in no acute distress. Hard of hearing.  Head: Dry MM. , markely elevated JVD.  Lungs: inspiratory course rales (  "sound of rushing water" with inspiration) on right mid lung field  Heart: Irregularly irregular rate and rhythm, S1 S2, no rub/gallop, DP and PT pulses are 2+ equally and bilaterally.   Neck: No carotid bruits. No lymphadenopathy.  JVP 10-12 cm Abdomen: Bowel sounds present, abdomen soft and non-tender without masses or hernias noted. Extremities: No clubbing or cyanosis.  1+ pitting edema bilaterally up to her midshins.  Chronic stasis changes. Neuro: Alert, pleasantly demented. No focal deficits noted. Psych:  Good affect.   Labs:   Lab Results  Component Value Date   WBC 3.9* 07/29/2013   HGB 8.6* 07/29/2013   HCT 27.9* 07/29/2013   MCV 99.6 07/29/2013   PLT 179 07/29/2013    Recent Labs  07/27/13 1130  INR 1.09     Recent Labs Lab 07/27/13 1130  07/29/13 0454  NA 139  < > 137  K 4.1  < > 4.1  CL 105  < > 106  CO2 25  < > 21  BUN 63*  < > 50*  CREATININE 2.20*  < > 2.07*  CALCIUM 8.8  < > 8.2*  PROT 8.3  --   --   BILITOT 0.4  --   --   ALKPHOS 110  --   --   ALT 27  --   --   AST 45*  --   --   GLUCOSE 95  < > 82  ALBUMIN 2.4*  --   --   < > = values in this interval not displayed. No results found for this basename: mg    Recent Labs  07/27/13 1130  CKTOTAL 35    Recent Labs  07/27/13 1157  TROPIPOC 0.01   Pro B Natriuretic peptide (BNP)  Date/Time Value Range Status  07/27/2013 11:30 AM 10117.0* 0 - 450 pg/mL Final   Lab Results  Component Value Date   CHOL  Value: 72        ATP III CLASSIFICATION:  <200     mg/dL   Desirable  409-811   mg/dL   Borderline High  >=914    mg/dL   High        03/29/2955   HDL 14* 12/25/2008   LDLCALC  Value: 47        Total Cholesterol/HDL:CHD Risk Coronary Heart Disease Risk Table                     Men   Women  1/2 Average Risk   3.4   3.3  Average Risk       5.0   4.4  2 X Average Risk   9.6   7.1  3 X Average Risk  23.4   11.0        Use the calculated Patient Ratio above and the CHD Risk Table to determine the patient's CHD Risk.        ATP III CLASSIFICATION (LDL):  <100     mg/dL   Optimal  027-253  mg/dL   Near or Above                    Optimal  130-159  mg/dL   Borderline  664-403  mg/dL   High  >474     mg/dL   Very High 10/26/9561   TRIG 56 12/25/2008   Lab Results  Component Value Date   DDIMER  Value: 10.08        AT THE INHOUSE ESTABLISHED CUTOFF VALUE OF 0.48 ug/mL FEU, THIS ASSAY HAS BEEN DOCUMENTED IN THE LITERATURE TO HAVE A SENSITIVITY AND NEGATIVE PREDICTIVE VALUE OF AT LEAST 98 TO 99%.  THE TEST RESULT SHOULD BE CORRELATED WITH AN ASSESSMENT OF THE CLINICAL PROBABILITY OF DVT / VTE.* 12/25/2008   Lipase  Date/Time Value Range Status  07/27/2013 11:30 AM 68* 11 - 59 U/L Final   TSH  Date/Time Value Range Status  07/27/2013 10:54 PM 5.006* 0.350 - 4.500 uIU/mL Final     Performed at [facility]   Vitamin B-12  Date/Time Value Range Status  06/17/2009 10:40 PM 1858* 211-911 pg/mL Final     Folate  Date/Time Value Range Status  02/11/2009 11:54 AM 7.1   Final     See lab report for associated comment(s)    Echo: Pending.  ECG:  A. Fib, poor R wave progression, T wave flattening in inferiolateral leads. Similar to previous tracings.  Radiology:  Dg Chest 2 View  07/28/2013   CLINICAL DATA:  Dyspnea with CHF.  EXAM: CHEST  2 VIEW  COMPARISON:  07/27/2013  FINDINGS: The cardio pericardial silhouette is enlarged. Pulmonary vascular congestion is again noted. Small bilateral pleural effusions are evident. Telemetry leads overlie the chest. Multiple right-sided rib fractures  again noted. Hazy opacity overlying the right mid lung is presumed to be secondary to pleural fluid.  IMPRESSION: Marked enlargement of the cardiopericardial silhouette with small bilateral pleural effusions and associated vascular congestion.   Electronically Signed   By: Kennith Center M.D.   On: 07/28/2013 12:41   Dg Hip Complete Right  07/27/2013   CLINICAL DATA:  Right hip discomfort without history of injury.  EXAM: RIGHT HIP - COMPLETE 2+ VIEW  COMPARISON:  June 02, 2012.  FINDINGS: The proximal femurs and bony pelvis are osteopenic. There is no evidence of an acute hip fracture or dislocation. There is mild symmetric narrowing of both hip joint spaces. No fracture of the visualized portions of the pelvis is demonstrated. There are degenerative changes of the lower lumbar spine. The soft tissues of the pelvis exhibit no acute abnormalities.  IMPRESSION: There are no findings to suggest an acute fracture of the right hip. There are mild degenerative changes present.  Electronically Signed   By: David  [place]   On: 07/27/2013 16:10   Dg Chest Port 1 View  07/27/2013   CLINICAL DATA:  Chest pain  EXAM: PORTABLE CHEST - 1 VIEW  COMPARISON:  01/13/2013  FINDINGS: Cardiomegaly is noted. Central mild vascular congestion without pulmonary edema. Question small right pleural effusion with right basilar atelectasis or infiltrate. Mild left basilar atelectasis. Old right rib fractures.  IMPRESSION: Central mild vascular congestion without pulmonary edema. Question small right pleural effusion with right basilar atelectasis or infiltrate. Mild left basilar atelectasis.   Electronically Signed   By: Natasha Mead M.D.   On: 07/27/2013 11:51    ASSESSMENT AND PLAN:   Principal Problem:   Acute renal insufficiency Active Problems:   HYPOTHYROIDISM   DEMENTIA   HYPERTENSION   RENAL INSUFFICIENCY, CHRONIC   Failure to thrive   CHF, chronic   Right hip pain   Peripheral vascular disease   1. Chronic  combined systolic and diastolic CHF:  She appears to be at baseline.  She continues to have renal insufficiency   2. Atrial fibrillation: Chronic.  She has not been anticoagulated due to fall risk.  HR is controlled currently on metoprolol.    3. Orthostatic hypotension: Stop amlodipine and hydralazine   4. Presumed AKI on CKD: Possible due to hypoperfusion with orthostatic hypotension in the setting of multiple antihypertensives and ongoing diuretic use.  Agree with hold diuretic until creatinine starts to trend down and BP higher but would not give further IV fluid.    She did not answer questions this am.  I dont think that we have much more to offer. Agree with NSF placement on Monday.  Will sign off.  Call for question.    Elyn Aquas. 07/29/2013 11:01 AM

## 2013-07-29 NOTE — Progress Notes (Addendum)
[facility] PROGRESS NOTE   Emily Peters OZH:[id number] DOB: [birth year] DOA: 07/27/2013 PCP: Fredderick Severance, MD  HPI: Patient is a 79 year old female with multiple medical problems including hypothyroidism, hypertension, dementia, chronic systolic CHF (unknown EF), cataracts, GERD, chronic renal insufficiency who lives alone except her grandson stays with her at night, was brought to the ER by her son for the above complaints. Patient was not able to provide any history herself due to dementia and hearing deficit. Per patient's son, she ambulates with a walker, and had been in her baseline health until last 2 days when she has been feeling very weak, not getting up from the bed. Patient has physical therapist and home health aid who also comes to the house for bathing. Patient's son reported that she had no nausea, vomiting. She may have had slight diarrhea, has been eating, no fevers or chills or headaches. Patient had not reported any chest pain or shortness of breath to him. He also reported that she has fallen recently as "her legs give away" and had been complaining of hip pain and knee pain. Patient follows wound care center for her lower extremity edema and wounds.  Assessment/Plan: Acute renal insufficiency on chronic renal disease stage III, baseline creatinine 1.8 in September 2013, currently 2 creatinine 2.2 [place] 629[address].  - hold amlodipine, hydralazine  - blood pressure stable, CXR with fluid overload, restarted Lasix 11/7 - renal function stable, HYPOTHYROIDISM  -TSH 5. DEMENTIA WITH FAILURE TO THRIVE  -Patient may need higher level of care at this point  - SNF likely in 2-3 days HYPERTENSION: stable on Lasix and Metop. Continue Right hip pain  - Placed on pain control, right hip x-rays negative Peripheral vascular disease   Diet: heart Fluids: none DVT Prophylaxis: heparin  Code Status: DNR Family Communication: son bedside  Disposition Plan: inpatient, home vs SNF  when ready  Consultants:  Cardiology  Procedures:  2D echo Study Conclusions  - Left ventricle: The cavity size was normal. Wall thickness was increased in a pattern of mild LVH. Systolic function was mildly to moderately reduced. The estimated ejection fraction was in the range of 40% to 45%. Diffuse hypokinesis. Features are consistent with a pseudonormal left ventricular filling pattern, with concomitant abnormal relaxation and increased filling pressure (grade 2 diastolic dysfunction). - Aortic valve: There was mild to moderate stenosis. Moderate regurgitation. Valve area: 0.94cm^2(VTI). Valve area: 1.05cm^2 (Vmax). - Mitral valve: Calcified annulus. Mild regurgitation. - Left atrium: The atrium was moderately dilated. - Right atrium: The atrium was moderately to severely dilated. - Tricuspid valve: Moderate regurgitation. - Pulmonary arteries: Systolic pressure was mildly increased. PA peak pressure: 39mm Hg (S).    Antibiotics - none  HPI/Subjective: Confused, feeling well  Objective: Filed Vitals:   07/28/13 0928 07/28/13 1524 07/28/13 2112 07/29/13 0529  BP: 125/73 142/56 123/46 117/60  Pulse: 70 64 62 72  Temp: 97.4 F (36.3 C) 97.6 F (36.4 C) 98.4 F (36.9 C) 97.2 F (36.2 C)  TempSrc: Oral Oral Oral Oral  Resp: 18 18 18 16   Height:      Weight:    66 kg (145 lb 8.1 oz)  SpO2: 98% 94% 98% 98%    Intake/Output Summary (Last 24 hours) at 07/29/13 8413 Last data filed at 07/28/13 1759  Gross per 24 hour  Intake    963 ml  Output      0 ml  Net    963 ml   [employer]  07/27/13 1633 07/28/13 0429 07/29/13 0529  Weight: 66.6 kg (146 lb 13.2 oz) 65.7 kg (144 lb 13.5 oz) 66 kg (145 lb 8.1 oz)   Exam:  General:  NAD  Cardiovascular: regular rate and rhythm, without MRG  Respiratory: difficult exam, crackles  Abdomen: soft, not tender to palpation, positive bowel sounds  MSK: 1 + peripheral edema  Neuro: grossly non focal  Data  Reviewed: Basic Metabolic Panel:  Recent Labs Lab 07/27/13 1130 07/28/13 0505 07/29/13 0454  NA 139 139 137  K 4.1 4.4 4.1  CL 105 107 106  CO2 25 23 21   GLUCOSE 95 86 82  BUN 63* 57* 50*  CREATININE 2.20* 2.02* 2.07*  CALCIUM 8.8 8.3* 8.2*   Liver Function Tests:  Recent Labs Lab 07/27/13 1130  AST 45*  ALT 27  ALKPHOS 110  BILITOT 0.4  PROT 8.3  ALBUMIN 2.4*    Recent Labs Lab 07/27/13 1130  LIPASE 68*   CBC:  Recent Labs Lab 07/27/13 1130 07/28/13 0505 07/29/13 0454  WBC 3.7* 4.5 3.9*  NEUTROABS 2.5  --   --   HGB 9.4* 8.6* 8.6*  HCT 29.2* 27.6* 27.9*  MCV 99.3 100.0 99.6  PLT 207 212 179   Cardiac Enzymes:  Recent Labs Lab 07/27/13 1130  CKTOTAL 35   BNP (last 3 results)  Recent Labs  07/27/13 1130  PROBNP 10117.0*   Studies: Dg Chest 2 View  07/28/2013   CLINICAL DATA:  Dyspnea with CHF.  EXAM: CHEST  2 VIEW  COMPARISON:  07/27/2013  FINDINGS: The cardio pericardial silhouette is enlarged. Pulmonary vascular congestion is again noted. Small bilateral pleural effusions are evident. Telemetry leads overlie the chest. Multiple right-sided rib fractures again noted. Hazy opacity overlying the right mid lung is presumed to be secondary to pleural fluid.  IMPRESSION: Marked enlargement of the cardiopericardial silhouette with small bilateral pleural effusions and associated vascular congestion.   Electronically Signed   By: Kennith Center M.D.   On: 07/28/2013 12:41   Dg Hip Complete Right  07/27/2013   CLINICAL DATA:  Right hip discomfort without history of injury.  EXAM: RIGHT HIP - COMPLETE 2+ VIEW  COMPARISON:  June 02, 2012.  FINDINGS: The proximal femurs and bony pelvis are osteopenic. There is no evidence of an acute hip fracture or dislocation. There is mild symmetric narrowing of both hip joint spaces. No fracture of the visualized portions of the pelvis is demonstrated. There are degenerative changes of the lower lumbar spine. The soft  tissues of the pelvis exhibit no acute abnormalities.  IMPRESSION: There are no findings to suggest an acute fracture of the right hip. There are mild degenerative changes present.   Electronically Signed   By: David  [place]   On: 07/27/2013 16:10   Dg Chest Port 1 View  07/27/2013   CLINICAL DATA:  Chest pain  EXAM: PORTABLE CHEST - 1 VIEW  COMPARISON:  01/13/2013  FINDINGS: Cardiomegaly is noted. Central mild vascular congestion without pulmonary edema. Question small right pleural effusion with right basilar atelectasis or infiltrate. Mild left basilar atelectasis. Old right rib fractures.  IMPRESSION: Central mild vascular congestion without pulmonary edema. Question small right pleural effusion with right basilar atelectasis or infiltrate. Mild left basilar atelectasis.   Electronically Signed   By: Natasha Mead M.D.   On: 07/27/2013 11:51   Scheduled Meds: . aspirin EC  81 mg Oral Daily  . cholecalciferol  2,000 Units Oral Daily  . donepezil  10  mg Oral QHS  . ferrous sulfate  325 mg Oral Daily  . furosemide  40 mg Oral BID  . heparin  5,000 Units Subcutaneous Q8H  . hydrocerin   Topical Daily  . levothyroxine  100 mcg Oral QAC breakfast  . metoprolol tartrate  12.5 mg Oral TID  . pantoprazole  80 mg Oral Daily  . sodium chloride  3 mL Intravenous Q12H   Continuous Infusions:   Principal Problem:   Acute renal insufficiency Active Problems:   HYPOTHYROIDISM   DEMENTIA   HYPERTENSION   RENAL INSUFFICIENCY, CHRONIC   Failure to thrive   CHF, chronic   Right hip pain   Peripheral vascular disease  Time spent: 35  Pamella Pert, MD [facility] Pager [phone]. If 7 PM - 7 AM, please contact night-coverage at [email or web address], password [facility] 07/29/2013, 7:12 AM  LOS: 2 days

## 2013-07-30 DIAGNOSIS — I5043 Acute on chronic combined systolic (congestive) and diastolic (congestive) heart failure: Secondary | ICD-10-CM

## 2013-07-30 DIAGNOSIS — R079 Chest pain, unspecified: Secondary | ICD-10-CM

## 2013-07-30 LAB — BASIC METABOLIC PANEL
BUN: 49 mg/dL — ABNORMAL HIGH (ref 6–23)
Calcium: 8.2 mg/dL — ABNORMAL LOW (ref 8.4–10.5)
Chloride: 106 mEq/L (ref 96–112)
Creatinine, Ser: 2.11 mg/dL — ABNORMAL HIGH (ref 0.50–1.10)
GFR calc Af Amer: 22 mL/min — ABNORMAL LOW (ref >90)
Glucose, Bld: 80 mg/dL (ref 70–99)

## 2013-07-30 NOTE — Progress Notes (Signed)
Clinical Social Work Department BRIEF PSYCHOSOCIAL ASSESSMENT 07/30/2013  Patient:  Emily Peters, Emily Peters     Account Number:  [id number]     Admit date:  07/27/2013  Clinical Social Worker:  Doree Albee  Date/Time:  07/30/2013 12:00 M  Referred by:  RN  Date Referred:  07/30/2013 Referred for  SNF Placement   Other Referral:   Interview type:  Patient Other interview type:   patient son, [name] Lasota    PSYCHOSOCIAL DATA Living Status:  FAMILY Admitted from facility:   Level of care:   Primary support name:  [name] Mclees Primary support relationship to patient:  CHILD, ADULT Degree of support available:   son    CURRENT CONCERNS Current Concerns  Post-Acute Placement   Other Concerns:    SOCIAL WORK ASSESSMENT / PLAN CSW met with pt at bedside to complete psychosocial assessment. Pt is very hard of hearing. Pt gave csw permission to discuss pt discharge plans with pt son, who can assist with disposition needs.    CSW spoke with Telia Amundson, pt son at [phone]. Pt son, agreed that patient would need short term rehab at skilled nursing. Pt son stated that about 7 years ago, pt was at [facility] and they were pleased. Pt son stated that now they're new facilities and are interested in other options. Pt son agreed to [facility] for short term rehab.    Pt son states he will be here tomorrow at 9am and can be reached at [phone] to set up a time to meet with unit csw.  CSW will complete fl2 for MD signature and place on chart once completed.    CSW will initiate snf search, please see placement note for progress.   Assessment/plan status:   Other assessment/ plan:   Information/referral to community resources:   skilled nursing facility list to be provided to pt and pt son with bed offers.    PATIENT'S/FAMILY'S RESPONSE TO PLAN OF CARE: Pt and pt son thanked csw for concern and support. Pt and pt family are motivated for pt to discharge to skilled  nursing for short term rehab when medically stable.       Catha Gosselin, LCSWA weekend covering CSW [phone] 07/30/2013 1412pm

## 2013-07-30 NOTE — Progress Notes (Signed)
Clinical Social Work Department CLINICAL SOCIAL WORK PLACEMENT NOTE 07/30/2013  Patient:  Emily Peters, Emily Peters  Account Number:  [id number] Admit date:  07/27/2013  Clinical Social Worker:  Doree Albee  Date/time:  07/30/2013 02:33 PM  Clinical Social Work is seeking post-discharge placement for this patient at the following level of care:   SKILLED NURSING   (*CSW will update this form in Epic as items are completed)   07/30/2013  Patient/family provided with [facility] Department of Clinical Social Work's list of facilities offering this level of care within the geographic area requested by the patient (or if unable, by the patient's family).  07/30/2013  Patient/family informed of their freedom to choose among providers that offer the needed level of care, that participate in Medicare, Medicaid or managed care program needed by the patient, have an available bed and are willing to accept the patient.  07/30/2013  Patient/family informed of [facility]' ownership interest in [facility], as well as of the fact that they are under no obligation to receive care at this facility.  PASARR submitted to EDS on 07/30/2013 PASARR number received from EDS on 07/30/2013  FL2 transmitted to all facilities in geographic area requested by pt/family on  07/30/2013 FL2 transmitted to all facilities within larger geographic area on   Patient informed that his/her managed care company has contracts with or will negotiate with  certain facilities, including the following:     Patient/family informed of bed offers received:   Patient chooses bed at  Physician recommends and patient chooses bed at    Patient to be transferred to  on   Patient to be transferred to facility by   The following physician request were entered in Epic:   Additional Comments:  Catha Gosselin, LCSWA weekend covering CSW [phone] .07/30/2013 1434pm

## 2013-07-30 NOTE — Progress Notes (Signed)
[facility] PROGRESS NOTE  Airika Alkhatib Wall ZOX:[id number] DOB: 08-08-1915 DOA: 07/27/2013 PCP: Fredderick Severance, MD  Assessment/Plan: Acute renal insufficiency on chronic renal disease stage III, baseline creatinine 1.8 in September 2013, currently 2 creatinine 2.2 the [place]NP of 81191.  - hold amlodipine, hydralazine  - blood pressure stable, CXR with fluid overload, restarted Lasix 11/7. Lasic on hold for 11/9 for mild Cr bump. Restart Lasix 11/10. HYPOTHYROIDISM  -TSH 5. DEMENTIA WITH FAILURE TO THRIVE  -Patient may need higher level of care at this point  - SNF hopefully on Monday morning if stable.  HYPERTENSION: stable on Lasix and Metop. Continue Right hip pain  - Placed on pain control, right hip x-rays negative Peripheral vascular disease   Diet: heart Fluids: none DVT Prophylaxis: heparin  Code Status: DNR Family Communication: son bedside  Disposition Plan: inpatient, home vs SNF when ready  Consultants:  Cardiology  Procedures:  2D echo Study Conclusions - Left ventricle: The cavity size was normal. Wall thickness was increased in a pattern of mild LVH. Systolic function was mildly to moderately reduced. The estimated ejection fraction was in the range of 40% to 45%. Diffuse hypokinesis. Features are consistent with a pseudonormal left ventricular filling pattern, with concomitant abnormal relaxation and increased filling pressure (grade 2 diastolic dysfunction). - Aortic valve: There was mild to moderate stenosis. Moderate regurgitation. Valve area: 0.94cm^2(VTI). Valve area: 1.05cm^2 (Vmax). - Mitral valve: Calcified annulus. Mild regurgitation. - Left atrium: The atrium was moderately dilated. - Right atrium: The atrium was moderately to severely dilated. - Tricuspid valve: Moderate regurgitation. - Pulmonary arteries: Systolic pressure was mildly increased. PA peak pressure: 39mm Hg (S).   Antibiotics - none  HPI/Subjective: - bit better today, talking more.    Objective: Filed Vitals:   07/29/13 0529 07/29/13 1409 07/29/13 1949 07/30/13 0431  BP: 117/60 123/70 130/61 129/56  Pulse: 72 89 95   Temp: 97.2 F (36.2 C) 97.6 F (36.4 C) 97.2 F (36.2 C) 97.5 F (36.4 C)  TempSrc: Oral Oral Oral Oral  Resp: 16 17 18 18   Height:      Weight: 66 kg (145 lb 8.1 oz)   65 kg (143 lb 4.8 oz)  SpO2: 98% 100% 100% 97%    Intake/Output Summary (Last 24 hours) at 07/30/13 0957 Last data filed at 07/30/13 0826  Gross per 24 hour  Intake    820 ml  Output      0 ml  Net    820 ml   Filed Weights   07/28/13 0429 07/29/13 0529 07/30/13 0431  Weight: 65.7 kg (144 lb 13.5 oz) 66 kg (145 lb 8.1 oz) 65 kg (143 lb 4.8 oz)   Exam:  General:  NAD  Cardiovascular: regular rate and rhythm, without MRG  Respiratory: difficult exam, crackles  Abdomen: soft, not tender to palpation, positive bowel sounds  MSK: 1 + peripheral edema  Neuro: grossly non focal  Data Reviewed: Basic Metabolic Panel:  Recent Labs Lab 07/27/13 1130 07/28/13 0505 07/29/13 0454 07/30/13 0355  NA 139 139 137 140  K 4.1 4.4 4.1 4.0  CL 105 107 106 106  CO2 25 23 21 25   GLUCOSE 95 86 82 80  BUN 63* 57* 50* 49*  CREATININE 2.20* 2.02* 2.07* 2.11*  CALCIUM 8.8 8.3* 8.2* 8.2*   Liver Function Tests:  Recent Labs Lab 07/27/13 1130  AST 45*  ALT 27  ALKPHOS 110  BILITOT 0.4  PROT 8.3  ALBUMIN 2.4*    Recent  Labs Lab 07/27/13 1130  LIPASE 68*   CBC:  Recent Labs Lab 07/27/13 1130 07/28/13 0505 07/29/13 0454  WBC 3.7* 4.5 3.9*  NEUTROABS 2.5  --   --   HGB 9.4* 8.6* 8.6*  HCT 29.2* 27.6* 27.9*  MCV 99.3 100.0 99.6  PLT 207 212 179   Cardiac Enzymes:  Recent Labs Lab 07/27/13 1130  CKTOTAL 35   BNP (last 3 results)  Recent Labs  07/27/13 1130  PROBNP 10117.0*   Studies: Dg Chest 2 View  07/28/2013   CLINICAL DATA:  Dyspnea with CHF.  EXAM: CHEST  2 VIEW  COMPARISON:  07/27/2013  FINDINGS: The cardio pericardial silhouette is  enlarged. Pulmonary vascular congestion is again noted. Small bilateral pleural effusions are evident. Telemetry leads overlie the chest. Multiple right-sided rib fractures again noted. Hazy opacity overlying the right mid lung is presumed to be secondary to pleural fluid.  IMPRESSION: Marked enlargement of the cardiopericardial silhouette with small bilateral pleural effusions and associated vascular congestion.   Electronically Signed   By: Kennith Center M.D.   On: 07/28/2013 12:41   Scheduled Meds: . aspirin EC  81 mg Oral Daily  . cholecalciferol  2,000 Units Oral Daily  . donepezil  10 mg Oral QHS  . ferrous sulfate  325 mg Oral Daily  . heparin  5,000 Units Subcutaneous Q8H  . hydrocerin   Topical Daily  . levothyroxine  100 mcg Oral QAC breakfast  . metoprolol tartrate  12.5 mg Oral TID  . pantoprazole  80 mg Oral Daily  . sodium chloride  3 mL Intravenous Q12H   Continuous Infusions:   Principal Problem:   Acute renal insufficiency Active Problems:   HYPOTHYROIDISM   DEMENTIA   HYPERTENSION   RENAL INSUFFICIENCY, CHRONIC   Failure to thrive   CHF, chronic   Right hip pain   Peripheral vascular disease  Time spent: 25  Pamella Pert, MD [facility] Pager [phone]. If 7 PM - 7 AM, please contact night-coverage at [email or web address], password [facility] 07/30/2013, 9:57 AM  LOS: 3 days

## 2013-07-31 LAB — BASIC METABOLIC PANEL
BUN: 45 mg/dL — ABNORMAL HIGH (ref 6–23)
CO2: 26 mEq/L (ref 19–32)
Calcium: 8.3 mg/dL — ABNORMAL LOW (ref 8.4–10.5)
Chloride: 105 mEq/L (ref 96–112)
Creatinine, Ser: 2.06 mg/dL — ABNORMAL HIGH (ref 0.50–1.10)
GFR calc non Af Amer: 19 mL/min — ABNORMAL LOW (ref >90)
Glucose, Bld: 84 mg/dL (ref 70–99)

## 2013-07-31 MED ORDER — FUROSEMIDE 40 MG PO TABS: 40.0000 mg | ORAL_TABLET | Freq: Every day | ORAL | Status: DC

## 2013-07-31 NOTE — Clinical Social Work Placement (Signed)
     Clinical Social Work Department CLINICAL SOCIAL WORK PLACEMENT NOTE 07/31/2013  Patient:  Emily Peters, Emily Peters  Account Number:  [id number] Admit date:  07/27/2013  Clinical Social Worker:  Doree Albee  Date/time:  07/30/2013 02:33 PM  Clinical Social Work is seeking post-discharge placement for this patient at the following level of care:   SKILLED NURSING   (*CSW will update this form in Epic as items are completed)   07/30/2013  Patient/family provided with [facility] Department of Clinical Social Works list of facilities offering this level of care within the geographic area requested by the patient (or if unable, by the patients family).  07/30/2013  Patient/family informed of their freedom to choose among providers that offer the needed level of care, that participate in Medicare, Medicaid or managed care program needed by the patient, have an available bed and are willing to accept the patient.  07/30/2013  Patient/family informed of [facility] ownership interest in [facility], as well as of the fact that they are under no obligation to receive care at this facility.  PASARR submitted to EDS on 07/30/2013 PASARR number received from EDS on 07/30/2013  FL2 transmitted to all facilities in geographic area requested by pt/family on  07/30/2013 FL2 transmitted to all facilities within larger geographic area on   Patient informed that his/her managed care company has contracts with or will negotiate with  certain facilities, including the following:   NA     Patient/family informed of bed offers received:  07/31/2013 Patient chooses bed at [facility] Physician recommends and patient chooses bed at    Patient to be transferred to [facility] RE[facility] on  07/31/2013 Patient to be transferred to facility by Ambulance Sharin Mons)  The following physician request were entered in Epic:   Additional Comments: 07/31/13   Ok per MD for d/c today to SNF. Family/patient chose [facility].  Ok per facility and nursing. CSW signing off.  Lorri Frederick. Handy Mcloud, LCSWA  [phone]

## 2013-07-31 NOTE — Progress Notes (Signed)
Pt d/c to short term rehab. [facility]. Via ambulance.  Report called to Ena Dawley, nurse at [facility].  Verbalized understanding.  Amanda Pea, Charity fundraiser.

## 2013-07-31 NOTE — Evaluation (Signed)
Clinical/Bedside Swallow Evaluation Patient Details  Name: Emily Peters MRN: [id number] Date of Birth: Sep 19, 1915  Today's Date: 07/31/2013 Time: 9147-8295 SLP Time Calculation (min): 14 min  Past Medical History:  Past Medical History  Diagnosis Date  . PAC (premature atrial contraction)   . Thyroid disease   . Hypertension   . Dementia   . Cataract   . Acid reflux   . CHF (congestive heart failure)   . Renal disorder   . Hypothyroidism   . Arthritis   . Peripheral vascular disease    Past Surgical History:  Past Surgical History  Procedure Laterality Date  . Abdominal hysterectomy    . Eye surgery    . Esophageal dilation      x 2   HPI:  Patient is a 79 year old female with multiple medical problems including hypothyroidism, hypertension, dementia, chronic systolic CHF (unknown EF), cataracts, GERD, chronic renal insufficiency who lives alone except her grandson stays with her at night, was brought to the ER by her son for the above complaints. Patient was not able to provide any history herself due to dementia and hearing deficit. Per patient's son, she ambulates with a walker, and had been in her baseline health until last 2 days when she has been feeling very weak, not getting up from the bed. Patient has physical therapist and home health aid who also comes to the house for bathing. Patient's son reported that she had no nausea, vomiting. She may have had slight diarrhea, has been eating, no fevers or chills or headaches. Patient had not reported any chest pain or shortness of breath to him. He also reported that she has fallen recently as "her legs give away" and had been complaining of hip pain and knee pain. Patient follows wound care center for her lower extremity edema and wounds.   Assessment / Plan / Recommendation Clinical Impression  Pt presents with signs of dysphagia including suspected delayed swallow initiation and decreased hyolaryngeal movement upon  palpation, however no overt s/s of aspiration were observed across challenging. Pt also exhibits prolonged mastication and oral transit of soft solid textures, and upon SLP arrival was noted to have oral residue from previous PO intake (unclear how long it had been present). Given the prolonged oral phase and oral residuals as well as her h/o esophageal-related issues, recommend to change diet to Dys 3 textures (mechanical soft) with thin liquids and full supervision for utilization of aspiration and reflux precautions. Pt is discharging to SNF today, where she would benefit from brief SLP f/u to ensure tolerance of new diet and/or the need for further modifications.    Aspiration Risk  Mild    Diet Recommendation Dysphagia 3 (Mechanical Soft);Thin liquid   Liquid Administration via: Cup;Straw;No straw Medication Administration: Whole meds with liquid Supervision: Patient able to self feed;Full supervision/cueing for compensatory strategies Compensations: Slow rate;Small sips/bites;Follow solids with liquid (check for oral residue) Postural Changes and/or Swallow Maneuvers: Seated upright 90 degrees;Upright 30-60 min after meal    Other  Recommendations Oral Care Recommendations: Oral care before and after PO   Follow Up Recommendations  Skilled Nursing facility (recommend brief SLP f/u to assess diet tolerance and/or need for further modifications)    Frequency and Duration        Pertinent Vitals/Pain N/A    SLP Swallow Goals  N/A - pt being discharged to SNF upon completion of this evaluation. Therefore, recommend that she receive brief f/u therapy at that level of care in  order to assess tolerance and make further diet modifications as necessary.   Swallow Study Prior Functional Status       General Date of Onset: 07/27/13 HPI: Patient is a 79 year old female with multiple medical problems including hypothyroidism, hypertension, dementia, chronic systolic CHF (unknown EF),  cataracts, GERD, chronic renal insufficiency who lives alone except her grandson stays with her at night, was brought to the ER by her son for the above complaints. Patient was not able to provide any history herself due to dementia and hearing deficit. Per patient's son, she ambulates with a walker, and had been in her baseline health until last 2 days when she has been feeling very weak, not getting up from the bed. Patient has physical therapist and home health aid who also comes to the house for bathing. Patient's son reported that she had no nausea, vomiting. She may have had slight diarrhea, has been eating, no fevers or chills or headaches. Patient had not reported any chest pain or shortness of breath to him. He also reported that she has fallen recently as "her legs give away" and had been complaining of hip pain and knee pain. Patient follows wound care center for her lower extremity edema and wounds. Type of Study: Bedside swallow evaluation Previous Swallow Assessment: none in chart Diet Prior to this Study: Regular;Thin liquids Temperature Spikes Noted: No Respiratory Status: Nasal cannula (2L) History of Recent Intubation: No Behavior/Cognition: Alert;Pleasant mood;Requires cueing;Hard of hearing Oral Cavity - Dentition: Edentulous (pt reports wearing dentures at home) Self-Feeding Abilities: Able to feed self Patient Positioning: Upright in bed Baseline Vocal Quality: Clear Volitional Cough: Weak Volitional Swallow: Unable to elicit    Oral/Motor/Sensory Function Overall Oral Motor/Sensory Function: Other (comment) (limited assessment due to decreased ability to follow commands) Labial ROM: Within Functional Limits Labial Symmetry: Within Functional Limits Lingual ROM: Within Functional Limits Lingual Symmetry: Within Functional Limits Facial ROM: Within Functional Limits Facial Symmetry: Within Functional Limits Mandible: Within Functional Limits   Ice Chips Ice chips: Not  tested   Thin Liquid Thin Liquid: Impaired Presentation: Cup;Self Fed;Straw Pharyngeal  Phase Impairments: Suspected delayed Swallow;Decreased hyoid-laryngeal movement    Nectar Thick Nectar Thick Liquid: Not tested   Honey Thick Honey Thick Liquid: Not tested   Puree Puree: Impaired Presentation: Self Fed;Spoon Pharyngeal Phase Impairments: Suspected delayed Swallow;Decreased hyoid-laryngeal movement   Solid   GO    Solid: Impaired Presentation: Self Fed Oral Phase Functional Implications: Other (comment) (prolonged oral phase) Pharyngeal Phase Impairments: Suspected delayed Swallow;Decreased hyoid-laryngeal movement       Maxcine Ham 07/31/2013,2:03 PM  Maxcine Ham, M.A. CCC-SLP [phone]

## 2013-07-31 NOTE — Discharge Summary (Addendum)
Physician Discharge Summary  Emily Peters ZOX:[id number] DOB: September 06, 1915 DOA: 07/27/2013  PCP: Fredderick Severance, MD  Admit date: 07/27/2013 Discharge date: 07/31/2013  Time spent: 35 minutes  Recommendations for Outpatient Follow-up:  1. Follow up with PCP in 1 week   Recommendations for primary care physician for things to follow:  Repeat BMP  Discharge Diagnoses:  Principal Problem:   Acute renal insufficiency Active Problems:   HYPOTHYROIDISM   DEMENTIA   HYPERTENSION   RENAL INSUFFICIENCY, CHRONIC   Failure to thrive   CHF, chronic   Right hip pain   Peripheral vascular disease  Discharge Condition: stable  Diet recommendation: low salt, heart healthy. Mechanical soft.  Filed Weights   07/29/13 0529 07/30/13 0431 07/31/13 0418  Weight: 66 kg (145 lb 8.1 oz) 65 kg (143 lb 4.8 oz) 64.2 kg (141 lb 8.6 oz)   History of present illness:  Patient is a 79 year old female with multiple medical problems including hypothyroidism, hypertension, dementia, chronic systolic CHF (unknown EF), cataracts, GERD, chronic renal insufficiency who lives alone except her grandson stays with her at night, was brought to the ER by her son for the above complaints. Patient was not able to provide any history herself due to dementia and hearing deficit. Per patient's son, she ambulates with a walker, and had been in her baseline health until last 2 days when she has been feeling very weak, not getting up from the bed. Patient has physical therapist and home health aid who also comes to the house for bathing. Patient's son reported that she had no nausea, vomiting. She may have had slight diarrhea, has been eating, no fevers or chills or headaches. Patient had not reported any chest pain or shortness of breath to him. He also reported that she has fallen recently as "her legs give away" and had been complaining of hip pain and knee pain. Patient follows wound care center for her lower extremity edema and  wounds.  Hospital Course:  Acute renal insufficiency on chronic renal disease stage III, baseline creatinine 1.8 in September 2013, currently creatinine ~2 [place] [address]. Hold amlodipine, hydralazine. Blood pressure stable, restarted Lasix 11/7. Lasic on hold for 11/9 for mild Cr bump; Cr better 11/10 am, resume Lasix. I have changed her Lasix from 40 BID to 40 daily and recommend daily weights and weekly BMP for Cr and K check.  HYPOTHYROIDISM -TSH 5. Recommend rechecking it in 3-4 weeks.  DEMENTIA WITH FAILURE TO THRIVE - SNF discharge. HYPERTENSION: stable on Lasix and Metop. Continue  Right hip pain - Placed on pain control, right hip x-rays negative  Peripheral vascular disease   Procedures:  2D echo  Study Conclusions - Left ventricle: The cavity size was normal. Wall thickness was increased in a pattern of mild LVH. Systolic function was mildly to moderately reduced. The estimated ejection fraction was in the range of 40% to 45%. Diffuse hypokinesis. Features are consistent with a pseudonormal left ventricular filling pattern, with concomitant abnormal relaxation and increased filling pressure (grade 2 diastolic dysfunction). - Aortic valve: There was mild to moderate stenosis. Moderate regurgitation. Valve area: 0.94cm^2(VTI). Valve area: 1.05cm^2 (Vmax). - Mitral valve: Calcified annulus. Mild regurgitation. - Left atrium: The atrium was moderately dilated. - Right atrium: The atrium was moderately to severely dilated. - Tricuspid valve: Moderate regurgitation. - Pulmonary arteries: Systolic pressure was mildly increased. PA peak pressure: 39mm Hg (S).  Consultations:  Cardiology  Discharge Exam: Filed Vitals:   07/30/13 0431 07/30/13 1300  07/30/13 1948 07/31/13 0418  BP: 129/56 130/60 121/67 129/75  Pulse:  95 67 64  Temp: 97.5 F (36.4 C) 97.3 F (36.3 C) 98.1 F (36.7 C) 98 F (36.7 C)  TempSrc: Oral Oral Oral Oral  Resp: 18 18 18 18   Height:      Weight: 65 kg  (143 lb 4.8 oz)   64.2 kg (141 lb 8.6 oz)  SpO2: 97% 97% 99% 99%   General: NAD, pleasantly demented Cardiovascular: irregular Respiratory: CTA biL  Discharge Instructions     Medication List    STOP taking these medications       amLODipine 10 MG tablet  Commonly known as:  NORVASC     hydrALAZINE 25 MG tablet  Commonly known as:  APRESOLINE      TAKE these medications       aspirin 81 MG tablet  Take 81 mg by mouth daily.     cholecalciferol 1000 UNITS tablet  Commonly known as:  VITAMIN D  Take 2,000 Units by mouth daily.     donepezil 10 MG tablet  Commonly known as:  ARICEPT  Take 10 mg by mouth at bedtime.     eucerin cream  Apply 1 application topically 3 (three) times daily as needed for dry skin.     ferrous sulfate 325 (65 FE) MG tablet  Take 1 tablet (325 mg total) by mouth daily.     furosemide 40 MG tablet  Commonly known as:  LASIX  Take 1 tablet (40 mg total) by mouth daily.     levothyroxine 100 MCG tablet  Commonly known as:  SYNTHROID, LEVOTHROID  Take 100 mcg by mouth daily before breakfast.     metoprolol 50 MG tablet  Commonly known as:  LOPRESSOR  Take 50 mg by mouth 2 (two) times daily.     omeprazole 40 MG capsule  Commonly known as:  PRILOSEC  Take 40 mg by mouth daily.     ranitidine 150 MG tablet  Commonly known as:  ZANTAC  Take 150 mg by mouth daily.     triamcinolone ointment 0.1 %  Commonly known as:  KENALOG  Apply 1 application topically 2 (two) times daily as needed (for rash).     VITAMIN B-12 IJ  Inject 1 application as directed every 30 (thirty) days.       Follow-up Information   Follow up with Fredderick Severance, MD. Schedule an appointment as soon as possible for a visit in 1 week.   Specialty:  Family Medicine   Contact information:   [address] [place] [address] [phone]       The results of significant diagnostics from this hospitalization (including imaging, microbiology,  ancillary and laboratory) are listed below for reference.    Significant Diagnostic Studies: Dg Chest 2 View  07/28/2013   CLINICAL DATA:  Dyspnea with CHF.  EXAM: CHEST  2 VIEW  COMPARISON:  07/27/2013  FINDINGS: The cardio pericardial silhouette is enlarged. Pulmonary vascular congestion is again noted. Small bilateral pleural effusions are evident. Telemetry leads overlie the chest. Multiple right-sided rib fractures again noted. Hazy opacity overlying the right mid lung is presumed to be secondary to pleural fluid.  IMPRESSION: Marked enlargement of the cardiopericardial silhouette with small bilateral pleural effusions and associated vascular congestion.   Electronically Signed   By: Kennith Center M.D.   On: 07/28/2013 12:41   Dg Hip Complete Right  07/27/2013   CLINICAL DATA:  Right  hip discomfort without history of injury.  EXAM: RIGHT HIP - COMPLETE 2+ VIEW  COMPARISON:  June 02, 2012.  FINDINGS: The proximal femurs and bony pelvis are osteopenic. There is no evidence of an acute hip fracture or dislocation. There is mild symmetric narrowing of both hip joint spaces. No fracture of the visualized portions of the pelvis is demonstrated. There are degenerative changes of the lower lumbar spine. The soft tissues of the pelvis exhibit no acute abnormalities.  IMPRESSION: There are no findings to suggest an acute fracture of the right hip. There are mild degenerative changes present.   Electronically Signed   By: David  [place]   On: 07/27/2013 16:10   Dg Chest Port 1 View  07/27/2013   CLINICAL DATA:  Chest pain  EXAM: PORTABLE CHEST - 1 VIEW  COMPARISON:  01/13/2013  FINDINGS: Cardiomegaly is noted. Central mild vascular congestion without pulmonary edema. Question small right pleural effusion with right basilar atelectasis or infiltrate. Mild left basilar atelectasis. Old right rib fractures.  IMPRESSION: Central mild vascular congestion without pulmonary edema. Question small right pleural  effusion with right basilar atelectasis or infiltrate. Mild left basilar atelectasis.   Electronically Signed   By: Natasha Mead M.D.   On: 07/27/2013 11:51    Microbiology: Recent Results (from the past 240 hour(s))  URINE CULTURE     Status: None   Collection Time    07/27/13 11:53 AM      Result Value Range Status   Specimen Description URINE, CATHETERIZED   Final   Special Requests NONE   Final   Culture  Setup Time     Final   Value: 07/27/2013 17:30     Performed at [facility] Count     Final   Value: NO GROWTH     Performed at [facility]   Culture     Final   Value: NO GROWTH     Performed at [facility]   Report Status 07/28/2013 FINAL   Final     Labs: Basic Metabolic Panel:  Recent Labs Lab 07/27/13 1130 07/28/13 0505 07/29/13 0454 07/30/13 0355 07/31/13 0345  NA 139 139 137 140 140  K 4.1 4.4 4.1 4.0 4.2  CL 105 107 106 106 105  CO2 25 23 21 25 26   GLUCOSE 95 86 82 80 84  BUN 63* 57* 50* 49* 45*  CREATININE 2.20* 2.02* 2.07* 2.11* 2.06*  CALCIUM 8.8 8.3* 8.2* 8.2* 8.3*   Liver Function Tests:  Recent Labs Lab 07/27/13 1130  AST 45*  ALT 27  ALKPHOS 110  BILITOT 0.4  PROT 8.3  ALBUMIN 2.4*    Recent Labs Lab 07/27/13 1130  LIPASE 68*   CBC:  Recent Labs Lab 07/27/13 1130 07/28/13 0505 07/29/13 0454  WBC 3.7* 4.5 3.9*  NEUTROABS 2.5  --   --   HGB 9.4* 8.6* 8.6*  HCT 29.2* 27.6* 27.9*  MCV 99.3 100.0 99.6  PLT 207 212 179   Cardiac Enzymes:  Recent Labs Lab 07/27/13 1130  CKTOTAL 35   BNP: BNP (last 3 results)  Recent Labs  07/27/13 1130  PROBNP 10117.0*   Signed:  Pamella Pert  [facility] 07/31/2013, 7:44 AM

## 2013-08-07 ENCOUNTER — Emergency Department (HOSPITAL_COMMUNITY): Payer: Medicare Other

## 2013-08-07 ENCOUNTER — Inpatient Hospital Stay (HOSPITAL_COMMUNITY)
Admission: EM | Admit: 2013-08-07 | Discharge: 2013-08-10 | DRG: 378 | Disposition: A | Discharge status: Skilled Nursing Facility | Payer: Medicare Other | Attending: Internal Medicine | Admitting: Internal Medicine

## 2013-08-07 ENCOUNTER — Encounter (HOSPITAL_COMMUNITY): Payer: Self-pay | Admitting: Emergency Medicine

## 2013-08-07 DIAGNOSIS — N189 Chronic kidney disease, unspecified: Secondary | ICD-10-CM | POA: Diagnosis present

## 2013-08-07 DIAGNOSIS — N179 Acute kidney failure, unspecified: Secondary | ICD-10-CM | POA: Diagnosis not present

## 2013-08-07 DIAGNOSIS — M25551 Pain in right hip: Secondary | ICD-10-CM

## 2013-08-07 DIAGNOSIS — K922 Gastrointestinal hemorrhage, unspecified: Secondary | ICD-10-CM

## 2013-08-07 DIAGNOSIS — I5022 Chronic systolic (congestive) heart failure: Secondary | ICD-10-CM | POA: Diagnosis present

## 2013-08-07 DIAGNOSIS — R079 Chest pain, unspecified: Secondary | ICD-10-CM

## 2013-08-07 DIAGNOSIS — M129 Arthropathy, unspecified: Secondary | ICD-10-CM | POA: Diagnosis present

## 2013-08-07 DIAGNOSIS — I4891 Unspecified atrial fibrillation: Secondary | ICD-10-CM | POA: Diagnosis present

## 2013-08-07 DIAGNOSIS — K625 Hemorrhage of anus and rectum: Secondary | ICD-10-CM | POA: Diagnosis present

## 2013-08-07 DIAGNOSIS — K5731 Diverticulosis of large intestine without perforation or abscess with bleeding: Principal | ICD-10-CM | POA: Diagnosis present

## 2013-08-07 DIAGNOSIS — R32 Unspecified urinary incontinence: Secondary | ICD-10-CM

## 2013-08-07 DIAGNOSIS — I129 Hypertensive chronic kidney disease with stage 1 through stage 4 chronic kidney disease, or unspecified chronic kidney disease: Secondary | ICD-10-CM | POA: Diagnosis present

## 2013-08-07 DIAGNOSIS — F0390 Unspecified dementia without behavioral disturbance: Secondary | ICD-10-CM | POA: Diagnosis present

## 2013-08-07 DIAGNOSIS — Z6826 Body mass index (BMI) 26.0-26.9, adult: Secondary | ICD-10-CM

## 2013-08-07 DIAGNOSIS — R269 Unspecified abnormalities of gait and mobility: Secondary | ICD-10-CM

## 2013-08-07 DIAGNOSIS — Z66 Do not resuscitate: Secondary | ICD-10-CM | POA: Diagnosis present

## 2013-08-07 DIAGNOSIS — I739 Peripheral vascular disease, unspecified: Secondary | ICD-10-CM

## 2013-08-07 DIAGNOSIS — M810 Age-related osteoporosis without current pathological fracture: Secondary | ICD-10-CM

## 2013-08-07 DIAGNOSIS — F068 Other specified mental disorders due to known physiological condition: Secondary | ICD-10-CM

## 2013-08-07 DIAGNOSIS — Z7982 Long term (current) use of aspirin: Secondary | ICD-10-CM

## 2013-08-07 DIAGNOSIS — K219 Gastro-esophageal reflux disease without esophagitis: Secondary | ICD-10-CM | POA: Diagnosis present

## 2013-08-07 DIAGNOSIS — E538 Deficiency of other specified B group vitamins: Secondary | ICD-10-CM

## 2013-08-07 DIAGNOSIS — N289 Disorder of kidney and ureter, unspecified: Secondary | ICD-10-CM

## 2013-08-07 DIAGNOSIS — R1319 Other dysphagia: Secondary | ICD-10-CM

## 2013-08-07 DIAGNOSIS — E46 Unspecified protein-calorie malnutrition: Secondary | ICD-10-CM | POA: Diagnosis present

## 2013-08-07 DIAGNOSIS — D510 Vitamin B12 deficiency anemia due to intrinsic factor deficiency: Secondary | ICD-10-CM | POA: Diagnosis present

## 2013-08-07 DIAGNOSIS — K222 Esophageal obstruction: Secondary | ICD-10-CM

## 2013-08-07 DIAGNOSIS — M48061 Spinal stenosis, lumbar region without neurogenic claudication: Secondary | ICD-10-CM

## 2013-08-07 DIAGNOSIS — I10 Essential (primary) hypertension: Secondary | ICD-10-CM

## 2013-08-07 DIAGNOSIS — N183 Chronic kidney disease, stage 3 unspecified: Secondary | ICD-10-CM | POA: Diagnosis present

## 2013-08-07 DIAGNOSIS — H9190 Unspecified hearing loss, unspecified ear: Secondary | ICD-10-CM | POA: Diagnosis present

## 2013-08-07 DIAGNOSIS — I509 Heart failure, unspecified: Secondary | ICD-10-CM | POA: Diagnosis present

## 2013-08-07 DIAGNOSIS — N184 Chronic kidney disease, stage 4 (severe): Secondary | ICD-10-CM

## 2013-08-07 DIAGNOSIS — IMO0002 Reserved for concepts with insufficient information to code with codable children: Secondary | ICD-10-CM

## 2013-08-07 DIAGNOSIS — N390 Urinary tract infection, site not specified: Secondary | ICD-10-CM

## 2013-08-07 DIAGNOSIS — R64 Cachexia: Secondary | ICD-10-CM | POA: Diagnosis present

## 2013-08-07 DIAGNOSIS — E039 Hypothyroidism, unspecified: Secondary | ICD-10-CM | POA: Diagnosis present

## 2013-08-07 DIAGNOSIS — D62 Acute posthemorrhagic anemia: Secondary | ICD-10-CM | POA: Diagnosis present

## 2013-08-07 LAB — POCT I-STAT, CHEM 8
BUN: 85 mg/dL — ABNORMAL HIGH (ref 6–23)
Calcium, Ion: 1.07 mmol/L — ABNORMAL LOW (ref 1.13–1.30)
Chloride: 105 mEq/L (ref 96–112)
Creatinine, Ser: 2.3 mg/dL — ABNORMAL HIGH (ref 0.50–1.10)
Glucose, Bld: 110 mg/dL — ABNORMAL HIGH (ref 70–99)
HCT: 29 % — ABNORMAL LOW (ref 36.0–46.0)
Hemoglobin: 9.9 g/dL — ABNORMAL LOW (ref 12.0–15.0)
Potassium: 4.2 mEq/L (ref 3.5–5.1)
Sodium: 144 mEq/L (ref 135–145)

## 2013-08-07 LAB — CBC WITH DIFFERENTIAL/PLATELET
Basophils Absolute: 0 10*3/uL (ref 0.0–0.1)
Basophils Relative: 0 % (ref 0–1)
Eosinophils Absolute: 0 10*3/uL (ref 0.0–0.7)
Eosinophils Relative: 0 % (ref 0–5)
HCT: 27.2 % — ABNORMAL LOW (ref 36.0–46.0)
Lymphocytes Relative: 16 % (ref 12–46)
MCH: 31.4 pg (ref 26.0–34.0)
MCHC: 31.3 g/dL (ref 30.0–36.0)
MCV: 100.4 fL — ABNORMAL HIGH (ref 78.0–100.0)
Neutrophils Relative %: 74 % (ref 43–77)
Platelets: 189 10*3/uL (ref 150–400)
RDW: 19 % — ABNORMAL HIGH (ref 11.5–15.5)
WBC: 6.2 10*3/uL (ref 4.0–10.5)

## 2013-08-07 LAB — COMPREHENSIVE METABOLIC PANEL
ALT: 10 U/L (ref 0–35)
AST: 29 U/L (ref 0–37)
Albumin: 1.7 g/dL — ABNORMAL LOW (ref 3.5–5.2)
BUN: 67 mg/dL — ABNORMAL HIGH (ref 6–23)
Calcium: 7.8 mg/dL — ABNORMAL LOW (ref 8.4–10.5)
GFR calc Af Amer: 21 mL/min — ABNORMAL LOW (ref >90)
Sodium: 141 mEq/L (ref 135–145)
Total Protein: 6.4 g/dL (ref 6.0–8.3)

## 2013-08-07 LAB — URINALYSIS, ROUTINE W REFLEX MICROSCOPIC
Glucose, UA: NEGATIVE mg/dL
Hgb urine dipstick: NEGATIVE
Nitrite: NEGATIVE
Protein, ur: 100 mg/dL — AB

## 2013-08-07 LAB — MRSA PCR SCREENING: MRSA by PCR: NEGATIVE

## 2013-08-07 LAB — URINE MICROSCOPIC-ADD ON

## 2013-08-07 LAB — PREPARE RBC (CROSSMATCH)

## 2013-08-07 MED ORDER — LEVOTHYROXINE SODIUM 100 MCG IV SOLR
56.0000 ug | Freq: Every day | INTRAVENOUS | Status: DC
  Administered 2013-08-08 – 2013-08-09 (×2): 56 ug via INTRAVENOUS
  Filled 2013-08-07 (×3): qty 5

## 2013-08-07 MED ORDER — MORPHINE SULFATE 2 MG/ML IJ SOLN
0.5000 mg | INTRAMUSCULAR | Status: DC | PRN
  Administered 2013-08-08: 1 mg via INTRAVENOUS
  Filled 2013-08-07: qty 1

## 2013-08-07 MED ORDER — ONDANSETRON HCL 4 MG/2ML IJ SOLN: 4.0000 mg | Freq: Four times a day (QID) | INTRAMUSCULAR | Status: DC | PRN

## 2013-08-07 MED ORDER — SODIUM CHLORIDE 0.9 % IV BOLUS (SEPSIS)
500.0000 mL | Freq: Once | INTRAVENOUS | Status: AC
  Administered 2013-08-07: 500 mL via INTRAVENOUS

## 2013-08-07 MED ORDER — ONDANSETRON HCL 4 MG PO TABS: 4.0000 mg | ORAL_TABLET | Freq: Four times a day (QID) | ORAL | Status: DC | PRN

## 2013-08-07 MED ORDER — PANTOPRAZOLE SODIUM 40 MG IV SOLR
40.0000 mg | Freq: Two times a day (BID) | INTRAVENOUS | Status: DC
  Filled 2013-08-07 (×2): qty 40

## 2013-08-07 MED ORDER — ACETAMINOPHEN 650 MG RE SUPP: 650.0000 mg | Freq: Four times a day (QID) | RECTAL | Status: DC | PRN

## 2013-08-07 MED ORDER — ACETAMINOPHEN 325 MG PO TABS
650.0000 mg | ORAL_TABLET | Freq: Four times a day (QID) | ORAL | Status: DC | PRN
  Administered 2013-08-09: 650 mg via ORAL
  Filled 2013-08-07: qty 2

## 2013-08-07 MED ORDER — SODIUM CHLORIDE 0.9 % IJ SOLN
3.0000 mL | Freq: Two times a day (BID) | INTRAMUSCULAR | Status: DC
  Administered 2013-08-08 – 2013-08-09 (×2): 3 mL via INTRAVENOUS

## 2013-08-07 MED ORDER — SODIUM CHLORIDE 0.9 % IV SOLN
INTRAVENOUS | Status: DC
  Administered 2013-08-08 – 2013-08-09 (×4): via INTRAVENOUS

## 2013-08-07 MED ORDER — SODIUM CHLORIDE 0.9 % IV BOLUS (SEPSIS): 1000.0000 mL | Freq: Once | INTRAVENOUS | Status: DC

## 2013-08-07 NOTE — ED Notes (Signed)
Pt will respond to verbal stimuli at this time.  Family remains at bedside.  Warm blankets applied.

## 2013-08-07 NOTE — ED Notes (Signed)
Per EMS pt from [facility] with c/o vaginal bleeding that began approximately 45 minutes ago. Pt has bled through a couple of pads prior to arrival. VSS. HR 80. O2 100%.

## 2013-08-07 NOTE — H&P (Addendum)
[facility] History and Physical  Emily Peters WUJ:[id number] DOB: 07-Jul-1915 DOA: 08/07/2013  Referring physician: Dr. Estell Harpin PCP: Fredderick Severance, MD  Specialists: GI-Dr. Randa Evens  Chief Complaint: Rectal bleeding/hematochezia  HPI: Emily Peters is a 79 y.o. female SNF resident with past medical history as listed below including chronic renal insufficiency-last creatinine 2.0(on 11/8), Pernicious anemia, chronic CHF, dementia last hospitalized earlier this November with altered mental status and found to have acute on chronic stage III renal disease and presents with above complaints. The history is obtained from EDP and family at bedside. It is reported that patient began having bleeding this morning and this has been continuous. Her son states that for 2-3 days she complained of vague abdominal pain. Also the report that she was constipated and had been given a laxative this at the nursing facility couple of days ago. No nausea or vomiting reported also no hematemesis. She was seen in the ED and initial hemoglobin was 8.5, recheck hemoglobin 9.9, BUN 85 with a creatinine of 2.3. Per EDP patient has been bleeding continuously while in ED-dark/maroon blood per rectum. She was typed and crossed and 2 units of packed red blood cells ordered and [facility] consulted. BP stable in the ED 115/48, pulse in the 80s. She denies chest pain and no shortness of breath. She is admitted for further evaluation and management. She has been on aspirin but no other Nsaid use reported.  Review of Systems: As per history of present illness, otherwise unobtainable  Past Medical History  Diagnosis Date  . PAC (premature atrial contraction)   . Thyroid disease   . Hypertension   . Dementia   . Cataract   . Acid reflux   . CHF (congestive heart failure)   . Renal disorder   . Hypothyroidism   . Arthritis   . Peripheral vascular disease    Past Surgical History  Procedure Laterality Date  . Abdominal  hysterectomy    . Eye surgery    . Esophageal dilation      x 2   Social History:  reports that she has never smoked. She has never used smokeless tobacco. She reports that she does not drink alcohol or use illicit drugs. where does patient live-snf   No Known Allergies  Family History  Problem Relation Age of Onset  . Heart failure    . Heart attack      Prior to Admission medications   Medication Sig Start Date End Date Taking? Authorizing Provider  acetaminophen (TYLENOL) 325 MG tablet Take 650 mg by mouth every 6 (six) hours as needed for mild pain, fever or headache.   Yes Historical Provider, MD  aspirin 81 MG tablet Take 81 mg by mouth daily.   Yes Historical Provider, MD  cholecalciferol (VITAMIN D) 1000 UNITS tablet Take 2,000 Units by mouth daily.    Yes Historical Provider, MD  Cyanocobalamin (VITAMIN B-12 IJ) Inject 1 application as directed every 30 (thirty) days.    Yes Historical Provider, MD  donepezil (ARICEPT) 10 MG tablet Take 10 mg by mouth at bedtime.   Yes Historical Provider, MD  ferrous sulfate 325 (65 FE) MG tablet Take 1 tablet (325 mg total) by mouth daily. 01/13/13  Yes Donnetta Hutching, MD  furosemide (LASIX) 40 MG tablet Take 1 tablet (40 mg total) by mouth daily. 07/31/13  Yes Costin Otelia Sergeant, MD  levothyroxine (SYNTHROID, LEVOTHROID) 112 MCG tablet Take 112 mcg by mouth daily before breakfast.   Yes Historical  Provider, MD  magnesium hydroxide (MILK OF MAGNESIA) 400 MG/5ML suspension Take 30 mLs by mouth daily as needed for mild constipation.   Yes Historical Provider, MD  metoprolol (LOPRESSOR) 50 MG tablet Take 50 mg by mouth 2 (two) times daily.   Yes Historical Provider, MD  omeprazole (PRILOSEC) 40 MG capsule Take 40 mg by mouth daily.   Yes Historical Provider, MD  ranitidine (ZANTAC) 150 MG tablet Take 150 mg by mouth daily.   Yes Historical Provider, MD  Skin Protectants, Misc. (EUCERIN) cream Apply 1 application topically 3 (three) times daily as  needed for dry skin.    Yes Historical Provider, MD   Physical Exam: Filed Vitals:   08/07/13 1745  BP: 115/48  Pulse: 62  Temp:   Resp: 12  Constitutional: Vital signs reviewed.  Patient is a well-developed and well-nourished,she is very pale appearing, sleeping but easily aroused, very hard of hearing Head: Normocephalic and atraumatic Mouth: no erythema or exudates, slightly dry mucous memb Eyes: PERRL, EOMI, conjunctival pallor present, No scleral icterus.  Neck: Supple, Trachea midline normal ROM, No JVD, mass, thyromegaly, or carotid bruit present.  Cardiovascular: RRR, S1 normal, S2 normal, no MRG, pulses symmetric and intact bilaterally Pulmonary/Chest: normal respiratory effort, CTAB, no wheezes, rales, or rhonchi Abdominal: Soft. Lower abdominal tenderness present, no rebound and, non-distended, bowel sounds are normal, no masses, organomegaly, or guarding present.  GU: no CVA tenderness Musculoskeletal: No joint deformities, erythema, or stiffness, ROM full and no nontender Hematology: no cervical, inginal, or axillary adenopathy.  Neurological: Alert, disoriented Strength is normal and symmetric bilaterally, cranial nerve II-XII are grossly intact, no focal motor deficit, sensory intact to light touch bilaterally.  Skin: Warm, dry and intact. No rash, cyanosis, or clubbing.  Psychiatric: Normal mood and affect.   Labs on Admission:  Basic Metabolic Panel:  Recent Labs Lab 08/07/13 1617 08/07/13 1643  NA 141 144  K 4.2 4.2  CL 107 105  CO2 25  --   GLUCOSE 110* 110*  BUN 67* 85*  CREATININE 2.15* 2.30*  CALCIUM 7.8*  --    Liver Function Tests:  Recent Labs Lab 08/07/13 1617  AST 29  ALT 10  ALKPHOS 79  BILITOT 0.4  PROT 6.4  ALBUMIN 1.7*   No results found for this basename: LIPASE, AMYLASE,  in the last 168 hours No results found for this basename: AMMONIA,  in the last 168 hours CBC:  Recent Labs Lab 08/07/13 1617 08/07/13 1643  WBC 6.2  --    NEUTROABS 4.6  --   HGB 8.5* 9.9*  HCT 27.2* 29.0*  MCV 100.4*  --   PLT 189  --    Cardiac Enzymes: No results found for this basename: CKTOTAL, CKMB, CKMBINDEX, TROPONINI,  in the last 168 hours  BNP (last 3 results)  Recent Labs  07/27/13 1130  PROBNP 10117.0*   CBG: No results found for this basename: GLUCAP,  in the last 168 hours  Radiological Exams on Admission: Ct Abdomen Pelvis Wo Contrast  08/07/2013   CLINICAL DATA:  17 old female with multiple medical problems. Patient cannot provide history.  EXAM: CT ABDOMEN AND PELVIS WITHOUT CONTRAST  TECHNIQUE: Multidetector CT imaging of the abdomen and pelvis was performed following the standard protocol without intravenous contrast.  COMPARISON:  None.  FINDINGS: Moderate enlargement of the cardiac silhouette. Moderate hiatal hernia. Small left and small moderate right pleural effusions. Minor dependent subsegmental atelectasis at the lung bases.  Two low-density liver lesions,  1 the left lobe and the larger in the posterior segment of the right lobe measuring 2.4 cm. These are likely cysts. Spleen is unremarkable.  Gallbladder is unremarkable. Normal pancreas. No bile duct dilation. No adrenal masses.  Bilateral renal cortical thinning. Large presumed cyst arises from the upper pole right kidney measuring 4.5 cm. There is a subtle lesion with evidence of minimal peripheral calcification arising from the upper pole of the left kidney also likely a cyst. No other renal masses. There are dilated intrarenal collecting system in ureters, right greater than left. There is no ureteral stone. The bladder is distended. No bladder mass or wall thickening is seen. The renal collecting system dilation is felt most likely to be due to transmitted pressure from the distended bladder.  Uterus is surgically absent. No pelvic masses. No pathologically enlarged lymph nodes. No ascites.  There is significant stool distending the rectum with mild  increased stool throughout the colon. No colonic wall thickening or inflammatory changes are seen. There are diverticula scattered throughout the colon. Appendix not visualized. Small bowel is unremarkable.  There are degenerative changes throughout the visualized spine. The bones are diffusely demineralized. Compression fractures are noted of T10 and T11, likely old. No osteoblastic or osteolytic lesions.  IMPRESSION: 1. No convincing acute abnormality within the abdomen or pelvis. 2. Stool significantly distends the rectum with mild increased stool noted throughout the colon. 3. Bladder is distended. This is felt to lead to bilateral renal collecting system ureteral dilation, greater on the right. 4. Small to moderate right and small left pleural effusions with mild associated subsegmental atelectasis. Moderate cardiomegaly. Moderate hiatal hernia. 5. Presumed renal and liver cysts. Colonic diverticulosis without diverticulitis. Extensive degenerative changes and demineralization of the visualized spine. Compression fractures of T10 and T11, presumed to be old.   Electronically Signed   By: Amie Portland M.D.   On: 08/07/2013 15:37     Assessment/Plan Active Problems: Present on Admission:  . Rectal bleed/GIB-Likely diverticular -As discussed above, serial H&H, will transfuse 2 units packed red blood cells continue to monitor and further transfuse as appropriate  -[facility] consulted per EDP and to see pt for further recs  -Place on PPI -volume resuscitation- PRBCs/ IVF Cautious IV fluids given her history of chronic CHF  . Acute blood loss anemia -secondary to above, see#1 . Acute on chronic renal failure -d/t #, hold diuretics, hydrate as above follow and recheck . CHF, chronic -Monitor her fluid status closely with cautious IV fluids as above and holding Lasix for now>> follow and resume when clinically appropriate     Code Status: DNR Family Communication: SON at bedside Disposition Plan:  admit to step down  Time spent: >25min  Kela Millin [facility] Pager [phone]  If 7PM-7AM, please contact night-coverage [email or web address] Password [facility] 08/07/2013, 6:19 PM

## 2013-08-07 NOTE — ED Notes (Signed)
Patient transported to X-ray 

## 2013-08-07 NOTE — ED Notes (Signed)
Pt continues to have constant rectal bleeding.  Attempting to clean pt. Even though dark red blood continues to come from rectum.  Pt's son at bedside talking to Dr. Estell Harpin.  Blood bank called stating at pt has hx of antibodies.  Dr. Estell Harpin made aware.

## 2013-08-07 NOTE — ED Notes (Signed)
Pt cleaned, again large amount of dark red blood.  Pt continues to respond to verbal stimuli.

## 2013-08-07 NOTE — ED Notes (Signed)
Pts family at bedside

## 2013-08-07 NOTE — ED Notes (Signed)
Femoral stick for blood obtained by Estell Harpin, MD

## 2013-08-07 NOTE — ED Provider Notes (Signed)
CSN: [id number]     Arrival date & time 08/07/13  1403 History   First MD Initiated Contact with Patient 08/07/13 1416     Chief Complaint  Patient presents with  . Rectal Bleeding   (Consider location/radiation/quality/duration/timing/severity/associated sxs/prior Treatment) Patient is a 79 y.o. female presenting with hematochezia. The history is provided by a relative (the son stated she started bleeding today).  Rectal Bleeding Quality:  Maroon Amount:  Copious Timing:  Constant Progression:  Unchanged Chronicity:  New Context: not anal fissures   Similar prior episodes: no   Relieved by:  Nothing   Past Medical History  Diagnosis Date  . PAC (premature atrial contraction)   . Thyroid disease   . Hypertension   . Dementia   . Cataract   . Acid reflux   . CHF (congestive heart failure)   . Renal disorder   . Hypothyroidism   . Arthritis   . Peripheral vascular disease    Past Surgical History  Procedure Laterality Date  . Abdominal hysterectomy    . Eye surgery    . Esophageal dilation      x 2   Family History  Problem Relation Age of Onset  . Heart failure    . Heart attack     History  Substance Use Topics  . Smoking status: Never Smoker   . Smokeless tobacco: Never Used  . Alcohol Use: No   OB History   Grav Para Term Preterm Abortions TAB SAB Ect Mult Living                 Review of Systems  Unable to perform ROS: Dementia  Gastrointestinal: Positive for hematochezia.    Allergies  Review of patient's allergies indicates no known allergies.  Home Medications   Current Outpatient Rx  Name  Route  Sig  Dispense  Refill  . acetaminophen (TYLENOL) 325 MG tablet   Oral   Take 650 mg by mouth every 6 (six) hours as needed for mild pain, fever or headache.         Marland Kitchen aspirin 81 MG tablet   Oral   Take 81 mg by mouth daily.         . cholecalciferol (VITAMIN D) 1000 UNITS tablet   Oral   Take 2,000 Units by mouth daily.          .  Cyanocobalamin (VITAMIN B-12 IJ)   Injection   Inject 1 application as directed every 30 (thirty) days.          Marland Kitchen donepezil (ARICEPT) 10 MG tablet   Oral   Take 10 mg by mouth at bedtime.         . ferrous sulfate 325 (65 FE) MG tablet   Oral   Take 1 tablet (325 mg total) by mouth daily.   30 tablet   1   . furosemide (LASIX) 40 MG tablet   Oral   Take 1 tablet (40 mg total) by mouth daily.   30 tablet      . levothyroxine (SYNTHROID, LEVOTHROID) 112 MCG tablet   Oral   Take 112 mcg by mouth daily before breakfast.         . magnesium hydroxide (MILK OF MAGNESIA) 400 MG/5ML suspension   Oral   Take 30 mLs by mouth daily as needed for mild constipation.         . metoprolol (LOPRESSOR) 50 MG tablet   Oral   Take 50 mg by mouth  2 (two) times daily.         Marland Kitchen omeprazole (PRILOSEC) 40 MG capsule   Oral   Take 40 mg by mouth daily.         . ranitidine (ZANTAC) 150 MG tablet   Oral   Take 150 mg by mouth daily.         . Skin Protectants, Misc. (EUCERIN) cream   Topical   Apply 1 application topically 3 (three) times daily as needed for dry skin.           BP 162/64  Pulse 89  Temp(Src) 97.8 F (36.6 C) (Rectal)  Resp 18  SpO2 100% Physical Exam  Constitutional: She appears well-developed.  HENT:  Head: Normocephalic.  Eyes: Conjunctivae and EOM are normal. No scleral icterus.  Neck: Neck supple. No thyromegaly present.  Cardiovascular: Normal rate and regular rhythm.  Exam reveals no gallop and no friction rub.   No murmur heard. Pulmonary/Chest: No stridor. She has no wheezes. She has no rales. She exhibits no tenderness.  Abdominal: She exhibits distension. There is no tenderness. There is no rebound.  Genitourinary:  Maroon hem pos blood from rectum  Musculoskeletal: Normal range of motion. She exhibits no edema.  Lymphadenopathy:    She has no cervical adenopathy.  Neurological: She exhibits normal muscle tone. Coordination normal.   lethargic  Skin: No rash noted. No erythema.  Psychiatric: She has a normal mood and affect. Her behavior is normal.    ED Course  Apiration of blood/fluid Date/Time: 08/07/2013 4:59 PM Performed by: Louie Flenner L Authorized by: Bethann Berkshire L Comments: Femoral stick done for blood.  Right femoral vein had blood drawn done by physician without problems   (including critical care time) Labs Review Labs Reviewed  POCT I-STAT, CHEM 8 - Abnormal; Notable for the following:    BUN 85 (*)    Creatinine, Ser 2.30 (*)    Glucose, Bld 110 (*)    Calcium, Ion 1.07 (*)    Hemoglobin 9.9 (*)    HCT 29.0 (*)    All other components within normal limits  CBC WITH DIFFERENTIAL  COMPREHENSIVE METABOLIC PANEL  URINALYSIS, ROUTINE W REFLEX MICROSCOPIC  TYPE AND SCREEN  PREPARE RBC (CROSSMATCH)   Imaging Review Ct Abdomen Pelvis Wo Contrast  08/07/2013   CLINICAL DATA:  49 old female with multiple medical problems. Patient cannot provide history.  EXAM: CT ABDOMEN AND PELVIS WITHOUT CONTRAST  TECHNIQUE: Multidetector CT imaging of the abdomen and pelvis was performed following the standard protocol without intravenous contrast.  COMPARISON:  None.  FINDINGS: Moderate enlargement of the cardiac silhouette. Moderate hiatal hernia. Small left and small moderate right pleural effusions. Minor dependent subsegmental atelectasis at the lung bases.  Two low-density liver lesions, 1 the left lobe and the larger in the posterior segment of the right lobe measuring 2.4 cm. These are likely cysts. Spleen is unremarkable.  Gallbladder is unremarkable. Normal pancreas. No bile duct dilation. No adrenal masses.  Bilateral renal cortical thinning. Large presumed cyst arises from the upper pole right kidney measuring 4.5 cm. There is a subtle lesion with evidence of minimal peripheral calcification arising from the upper pole of the left kidney also likely a cyst. No other renal masses. There are dilated  intrarenal collecting system in ureters, right greater than left. There is no ureteral stone. The bladder is distended. No bladder mass or wall thickening is seen. The renal collecting system dilation is felt most likely to be  due to transmitted pressure from the distended bladder.  Uterus is surgically absent. No pelvic masses. No pathologically enlarged lymph nodes. No ascites.  There is significant stool distending the rectum with mild increased stool throughout the colon. No colonic wall thickening or inflammatory changes are seen. There are diverticula scattered throughout the colon. Appendix not visualized. Small bowel is unremarkable.  There are degenerative changes throughout the visualized spine. The bones are diffusely demineralized. Compression fractures are noted of T10 and T11, likely old. No osteoblastic or osteolytic lesions.  IMPRESSION: 1. No convincing acute abnormality within the abdomen or pelvis. 2. Stool significantly distends the rectum with mild increased stool noted throughout the colon. 3. Bladder is distended. This is felt to lead to bilateral renal collecting system ureteral dilation, greater on the right. 4. Small to moderate right and small left pleural effusions with mild associated subsegmental atelectasis. Moderate cardiomegaly. Moderate hiatal hernia. 5. Presumed renal and liver cysts. Colonic diverticulosis without diverticulitis. Extensive degenerative changes and demineralization of the visualized spine. Compression fractures of T10 and T11, presumed to be old.   Electronically Signed   By: Amie Portland M.D.   On: 08/07/2013 15:37    EKG Interpretation   None     CRITICAL CARE Performed by: Renold Kozar L Total critical care time: 45 Critical care time was exclusive of separately billable procedures and treating other patients. Critical care was necessary to treat or prevent imminent or life-threatening deterioration. Critical care was time spent personally by me on  the following activities: development of treatment plan with patient and/or surrogate as well as nursing, discussions with consultants, evaluation of patient's response to treatment, examination of patient, obtaining history from patient or surrogate, ordering and performing treatments and interventions, ordering and review of laboratory studies, ordering and review of radiographic studies, pulse oximetry and re-evaluation of patient's condition.   MDM  No diagnosis found. Admit for gi bleed    Benny Lennert, MD 08/07/13 [phone]

## 2013-08-07 NOTE — ED Notes (Signed)
Attempt IV placement x2 without success.  Primary RN notified

## 2013-08-07 NOTE — ED Notes (Signed)
Patient transported to CT 

## 2013-08-07 NOTE — ED Notes (Signed)
Pt returned from CT °

## 2013-08-08 DIAGNOSIS — N184 Chronic kidney disease, stage 4 (severe): Secondary | ICD-10-CM | POA: Diagnosis present

## 2013-08-08 DIAGNOSIS — I509 Heart failure, unspecified: Secondary | ICD-10-CM

## 2013-08-08 LAB — RETICULOCYTES
RBC.: 3.01 MIL/uL — ABNORMAL LOW (ref 3.87–5.11)
Retic Ct Pct: 3.1 % (ref 0.4–3.1)

## 2013-08-08 LAB — FERRITIN: Ferritin: 34 ng/mL (ref 10–291)

## 2013-08-08 LAB — COMPREHENSIVE METABOLIC PANEL
BUN: 69 mg/dL — ABNORMAL HIGH (ref 6–23)
Calcium: 7.4 mg/dL — ABNORMAL LOW (ref 8.4–10.5)
GFR calc Af Amer: 21 mL/min — ABNORMAL LOW (ref >90)
Glucose, Bld: 88 mg/dL (ref 70–99)
Potassium: 4 mEq/L (ref 3.5–5.1)
Total Bilirubin: 0.8 mg/dL (ref 0.3–1.2)
Total Protein: 5.2 g/dL — ABNORMAL LOW (ref 6.0–8.3)

## 2013-08-08 LAB — IRON AND TIBC
Iron: 135 ug/dL (ref 42–135)
UIBC: <15 ug/dL — ABNORMAL LOW (ref 125–400)

## 2013-08-08 LAB — FOLATE: Folate: 6.8 ng/mL

## 2013-08-08 LAB — VITAMIN B12: Vitamin B-12: 1400 pg/mL — ABNORMAL HIGH (ref 211–911)

## 2013-08-08 LAB — HEMOGLOBIN AND HEMATOCRIT, BLOOD
HCT: 28.3 % — ABNORMAL LOW (ref 36.0–46.0)
Hemoglobin: 9.2 g/dL — ABNORMAL LOW (ref 12.0–15.0)

## 2013-08-08 MED ORDER — IRON DEXTRAN 50 MG/ML IJ SOLN
250.0000 mg | Freq: Every day | INTRAMUSCULAR | Status: DC
  Filled 2013-08-08 (×2): qty 5

## 2013-08-08 MED ORDER — METOPROLOL TARTRATE 1 MG/ML IV SOLN
2.5000 mg | Freq: Three times a day (TID) | INTRAVENOUS | Status: DC
  Administered 2013-08-08 – 2013-08-09 (×4): 2.5 mg via INTRAVENOUS
  Filled 2013-08-08 (×6): qty 5

## 2013-08-08 MED ORDER — IRON DEXTRAN 50 MG/ML IJ SOLN
25.0000 mg | Freq: Once | INTRAMUSCULAR | Status: AC
  Administered 2013-08-08: 25 mg via INTRAVENOUS
  Filled 2013-08-08: qty 0.5

## 2013-08-08 MED ORDER — SODIUM CHLORIDE 0.9 % IV SOLN
250.0000 mg | INTRAVENOUS | Status: AC
  Administered 2013-08-08 – 2013-08-10 (×2): 250 mg via INTRAVENOUS
  Filled 2013-08-08 (×3): qty 5

## 2013-08-08 MED ORDER — PANTOPRAZOLE SODIUM 40 MG IV SOLR
40.0000 mg | INTRAVENOUS | Status: DC
  Administered 2013-08-08: 40 mg via INTRAVENOUS
  Filled 2013-08-08: qty 40

## 2013-08-08 NOTE — Consult Note (Addendum)
Referring Provider: Dr. Suanne Marker Primary Care Physician:  Fredderick Severance, MD Primary Gastroenterologist:  Dr. Arboriculturist  Reason for Consultation:  Rectal bleeding  HPI: Emily Peters is a 79 y.o. female with multiple medical problems as stated below admitted due to the onset of rectal bleeding. She is demented and hard of hearing and not able to give any history. Her son is at the bedside and reports bleeding started while she was at the nursing home. She has been having multiple episodes of maroon-colored blood per rectum. She has been having diffuse abdominal pain for several days and her abdomen was distended to him. She has been constipated recently and has been on a laxative. Her son reports she had an episode of vomiting last week but has not had any further vomiting. No hematemesis or melena. No chest pain or shortness of breath. On Aspirin but no other NSAIDs. She has never had a colonoscopy. Hgb recheck of 9.9 (8.5 initially), which is close to her baseline. She has continued to have maroon stools overnight. She has a history of an esophageal stricture that was dilated in 2010 and her son reports that her swallowing has been very good since that dilation.   Past Medical History  Diagnosis Date  . PAC (premature atrial contraction)   . Thyroid disease   . Hypertension   . Dementia   . Cataract   . Acid reflux   . CHF (congestive heart failure)   . Renal disorder   . Hypothyroidism   . Arthritis   . Peripheral vascular disease     Past Surgical History  Procedure Laterality Date  . Abdominal hysterectomy    . Eye surgery    . Esophageal dilation      x 2    Prior to Admission medications   Medication Sig Start Date End Date Taking? Authorizing Provider  acetaminophen (TYLENOL) 325 MG tablet Take 650 mg by mouth every 6 (six) hours as needed for mild pain, fever or headache.   Yes Historical Provider, MD  aspirin 81 MG tablet Take 81 mg by mouth daily.   Yes Historical  Provider, MD  cholecalciferol (VITAMIN D) 1000 UNITS tablet Take 2,000 Units by mouth daily.    Yes Historical Provider, MD  Cyanocobalamin (VITAMIN B-12 IJ) Inject 1 application as directed every 30 (thirty) days.    Yes Historical Provider, MD  donepezil (ARICEPT) 10 MG tablet Take 10 mg by mouth at bedtime.   Yes Historical Provider, MD  ferrous sulfate 325 (65 FE) MG tablet Take 1 tablet (325 mg total) by mouth daily. 01/13/13  Yes Donnetta Hutching, MD  furosemide (LASIX) 40 MG tablet Take 1 tablet (40 mg total) by mouth daily. 07/31/13  Yes Costin Otelia Sergeant, MD  levothyroxine (SYNTHROID, LEVOTHROID) 112 MCG tablet Take 112 mcg by mouth daily before breakfast.   Yes Historical Provider, MD  magnesium hydroxide (MILK OF MAGNESIA) 400 MG/5ML suspension Take 30 mLs by mouth daily as needed for mild constipation.   Yes Historical Provider, MD  metoprolol (LOPRESSOR) 50 MG tablet Take 50 mg by mouth 2 (two) times daily.   Yes Historical Provider, MD  omeprazole (PRILOSEC) 40 MG capsule Take 40 mg by mouth daily.   Yes Historical Provider, MD  ranitidine (ZANTAC) 150 MG tablet Take 150 mg by mouth daily.   Yes Historical Provider, MD  Skin Protectants, Misc. (EUCERIN) cream Apply 1 application topically 3 (three) times daily as needed for dry skin.    Yes Historical  Provider, MD    Scheduled Meds: . levothyroxine  56 mcg Intravenous Daily  . pantoprazole (PROTONIX) IV  40 mg Intravenous Q12H  . sodium chloride  3 mL Intravenous Q12H   Continuous Infusions: . sodium chloride 75 mL/hr at 08/08/13 0600   PRN Meds:.acetaminophen, acetaminophen, morphine injection, ondansetron (ZOFRAN) IV, ondansetron  Allergies as of 08/07/2013  . (No Known Allergies)    Family History  Problem Relation Age of Onset  . Heart failure    . Heart attack      History   Social History  . Marital Status: Widowed    Spouse Name: N/A    Number of Children: N/A  . Years of Education: N/A   Occupational History   . Not on file.   Social History Main Topics  . Smoking status: Never Smoker   . Smokeless tobacco: Never Used  . Alcohol Use: No  . Drug Use: No  . Sexual Activity: No   Other Topics Concern  . Not on file   Social History Narrative  . No narrative on file    Review of Systems: Unable to obtain from patient  Physical Exam: Vital signs: Filed Vitals:   08/08/13 0825  BP: 109/58  Pulse: 92  Temp: 98.2 F (36.8 C)  Resp: 22     General:   Demented, awake, hard of hearing, no acute distress, cachetic  HEENT:  anicteric Lungs:  Clear throughout to auscultation.   No wheezes, crackles, or rhonchi. No acute distress. Heart:  Regular rate and rhythm; no murmurs, clicks, rubs,  or gallops. Abdomen: diffusely tender with guarding, soft, nondistended, +BS  Rectal:  Deferred Ext: no ankle edema  GI:  Lab Results:  Recent Labs  08/07/13 1617 08/07/13 1643 08/08/13 0740  WBC 6.2  --   --   HGB 8.5* 9.9* 9.2*  HCT 27.2* 29.0* 28.3*  PLT 189  --   --    BMET  Recent Labs  08/07/13 1617 08/07/13 1643  NA 141 144  K 4.2 4.2  CL 107 105  CO2 25  --   GLUCOSE 110* 110*  BUN 67* 85*  CREATININE 2.15* 2.30*  CALCIUM 7.8*  --    LFT  Recent Labs  08/07/13 1617  PROT 6.4  ALBUMIN 1.7*  AST 29  ALT 10  ALKPHOS 79  BILITOT 0.4   PT/INR No results found for this basename: LABPROT, INR,  in the last 72 hours   Studies/Results: Ct Abdomen Pelvis Wo Contrast  08/07/2013   CLINICAL DATA:  38 old female with multiple medical problems. Patient cannot provide history.  EXAM: CT ABDOMEN AND PELVIS WITHOUT CONTRAST  TECHNIQUE: Multidetector CT imaging of the abdomen and pelvis was performed following the standard protocol without intravenous contrast.  COMPARISON:  None.  FINDINGS: Moderate enlargement of the cardiac silhouette. Moderate hiatal hernia. Small left and small moderate right pleural effusions. Minor dependent subsegmental atelectasis at the lung  bases.  Two low-density liver lesions, 1 the left lobe and the larger in the posterior segment of the right lobe measuring 2.4 cm. These are likely cysts. Spleen is unremarkable.  Gallbladder is unremarkable. Normal pancreas. No bile duct dilation. No adrenal masses.  Bilateral renal cortical thinning. Large presumed cyst arises from the upper pole right kidney measuring 4.5 cm. There is a subtle lesion with evidence of minimal peripheral calcification arising from the upper pole of the left kidney also likely a cyst. No other renal masses. There are dilated intrarenal  collecting system in ureters, right greater than left. There is no ureteral stone. The bladder is distended. No bladder mass or wall thickening is seen. The renal collecting system dilation is felt most likely to be due to transmitted pressure from the distended bladder.  Uterus is surgically absent. No pelvic masses. No pathologically enlarged lymph nodes. No ascites.  There is significant stool distending the rectum with mild increased stool throughout the colon. No colonic wall thickening or inflammatory changes are seen. There are diverticula scattered throughout the colon. Appendix not visualized. Small bowel is unremarkable.  There are degenerative changes throughout the visualized spine. The bones are diffusely demineralized. Compression fractures are noted of T10 and T11, likely old. No osteoblastic or osteolytic lesions.  IMPRESSION: 1. No convincing acute abnormality within the abdomen or pelvis. 2. Stool significantly distends the rectum with mild increased stool noted throughout the colon. 3. Bladder is distended. This is felt to lead to bilateral renal collecting system ureteral dilation, greater on the right. 4. Small to moderate right and small left pleural effusions with mild associated subsegmental atelectasis. Moderate cardiomegaly. Moderate hiatal hernia. 5. Presumed renal and liver cysts. Colonic diverticulosis without  diverticulitis. Extensive degenerative changes and demineralization of the visualized spine. Compression fractures of T10 and T11, presumed to be old.   Electronically Signed   By: Amie Portland M.D.   On: 08/07/2013 15:37    Impression/Plan: 79 yo with onset of rectal bleeding that is likely due to a diverticular bleed. She could also have a stercoral ulcer from her recent constipation. Would recommend conservative management and NO invasive GI procedures. Transfuse prn. Ice chips and sips of water ok today and can advance tomorrow if stable. Supportive care.    LOS: 1 day   Shearon Clonch C.  08/08/2013, 8:44 AM

## 2013-08-08 NOTE — Clinical Social Work Psychosocial (Signed)
Clinical Social Work Department BRIEF PSYCHOSOCIAL ASSESSMENT 08/08/2013  Patient:  Emily Peters, Emily Peters     Account Number:  [id number]     Admit date:  08/07/2013  Clinical Social Worker:  Varney Biles  Date/Time:  08/08/2013 11:22 AM  Referred by:  Physician  Date Referred:  08/08/2013 Referred for  SNF Placement   Other Referral:   Interview type:  Family Other interview type:    PSYCHOSOCIAL DATA Living Status:  FACILITY Admitted from facility:  [facility] Level of care:  Skilled Nursing Facility Primary support name:  Emily Peters [phone] & [phone]) Primary support relationship to patient:  CHILD, ADULT Degree of support available:   Good--pt's children Emily Peters & Emily Peters were at bedside during CSW visit.    CURRENT CONCERNS Current Concerns  Post-Acute Placement   Other Concerns:    SOCIAL WORK ASSESSMENT / PLAN CSW introduced self and described CSW role. CSW explained that she ensures pt can go back to [facility]. Pt's son Emily Peters requested CSW send clinicals to other facilities, including [facility] as this is closer to his & Emily Peters's homes. CSW has sent clinicals to all facilities in [facility], including [facility] (with a note that this is family's first choice). Emily Peters says the family is happy with [facility] but that Emily Peters is closer to their home and they are curious about their other options. CSW will call Emily Peters when bed offers are made.   Assessment/plan status:  Psychosocial Support/Ongoing Assessment of Needs Other assessment/ plan:   Information/referral to community resources:   SNF (from [facility] but might like [facility] if a bed is available--family also wants to see what other choices they may have).    PATIENT'S/FAMILY'S RESPONSE TO PLAN OF CARE: Pt's son and daughter receptive to CSW visit. Both expressed gratitude for CSW assistance.       Emily Peters, MSW, [facility] Clinical Social  Worker [phone]

## 2013-08-08 NOTE — Clinical Social Work Placement (Addendum)
Clinical Social Work Department CLINICAL SOCIAL WORK PLACEMENT NOTE 08/08/2013  Patient:  Emily Peters, Emily Peters  Account Number:  [id number] Admit date:  08/07/2013  Clinical Social Worker:  Maryclare Labrador, Theresia Majors  Date/time:  08/08/2013 11:26 AM  Clinical Social Work is seeking post-discharge placement for this patient at the following level of care:   SKILLED NURSING   (*CSW will update this form in Epic as items are completed)   08/08/2013  Patient/family provided with [facility] Department of Clinical Social Work's list of facilities offering this level of care within the geographic area requested by the patient (or if unable, by the patient's family).  08/08/2013  Patient/family informed of their freedom to choose among providers that offer the needed level of care, that participate in Medicare, Medicaid or managed care program needed by the patient, have an available bed and are willing to accept the patient.  08/08/2013  Patient/family informed of [facility]' ownership interest in [facility], as well as of the fact that they are under no obligation to receive care at this facility.  PASARR submitted to EDS on EXISTING PASARR number received from EDS on EXISTING  FL2 transmitted to all facilities in geographic area requested by pt/family on  08/08/2013 FL2 transmitted to all facilities within larger geographic area on   Patient informed that his/her managed care company has contracts with or will negotiate with  certain facilities, including the following:     Patient/family informed of bed offers received:  08/08/2013 Patient chooses bed at [facility] Physician recommends and patient chooses bed at    Patient to be transferred to [facility] on 08/10/2013 Patient to be transferred to facility by ambulance.  The following physician request were entered in Epic:   Additional Comments: 08/10/2013 - CSW was advised by physician that patient was ready for discharge. CSW  called the patient's brother and confirmed SNF facility.    Maryclare Labrador, MSW, [facility] Clinical Social Worker [phone]

## 2013-08-08 NOTE — Progress Notes (Signed)
Utilization Review Completed.  

## 2013-08-08 NOTE — Progress Notes (Signed)
[facility] Progress Note [place] TEAM 1 - Stepdown ICU Team   Emily Peters ZOX:[id number] DOB: [birth year] DOA: 08/07/2013 PCP: Fredderick Severance, MD  Brief narrative: 79 y.o. female SNF resident sent to the ER of rectal bleeding. It was reported that patient began having bleeding on the morning of admission and this has been continuous. Her son stated that for 2-3 days she complained of vague abdominal pain. She recently was constipated and had been given a laxative at the nursing facility several days prior. No nausea or vomiting reported; also no hematemesis. She was seen in the ED and initial hemoglobin was 8.5, recheck hemoglobin 9.9, BUN 85 with a creatinine of 2.3. Per EDP patient had been bleeding continuously while in ED-dark/maroon blood per rectum. She was typed and crossed and 2 units of packed red blood cells ordered and [facility] consulted. BP stable in the ED 115/48, pulse in the 80s.    Assessment/Plan: Active Problems:   Rectal bleed -GI following -susppect due to diverticula or stercoral ulcer in setting of recent constipation -GI allowing ices chips/sips water today -NO plans for invasive procedure at this time    Acute blood loss anemia/ ANEMIA, PERNICIOUS -hgb stable after transfusion -check anemia panel -check CBC q 8 hours  Iron deficiency  -IV Iron x 2 doses ordered    CKD (chronic kidney disease) stage 4, GFR 15-29 ml/min -renal function stable and at baseline   Atrial fibrillation -given advanced age and current GIB not a Coumadin candidate    HYPERTENSION -BP soft insetting of ABL anemia -will allow low dose IV Lopressor to prevent RVR   GERD -decrease PPI to daily    CHF, chronic (mild systolic EF40% and grade 2 DD) -compensated    HYPOTHYROIDISM -cont Synthroid    DEMENTIA   DVT prophylaxis: SCDs Code Status: DO NOT RESUSCITATE Family Communication: Family at bedside Disposition Plan/Expected LOS: Remain in stepdown Isolation:  None Nutritional Status: Acute on chronic protein calorie malnutrition as evidenced by acute gastrointestinal bleeding as well as issues related to chronic constipation and dementia  Consultants: Gastroenterology  Procedures: None  Antibiotics: None  HPI/Subjective: Patient awake but clearly confused. Per staff and family it appears the previous rectal bleeding has slowed. No abdominal pain.  Objective: Blood pressure 109/58, pulse 92, temperature 98.2 F (36.8 C), temperature source Axillary, resp. rate 22, height 5\' 2"  (1.575 m), weight 132 lb 0.9 oz (59.9 kg), SpO2 97.00%.  Intake/Output Summary (Last 24 hours) at 08/08/13 1123 Last data filed at 08/08/13 0700  Gross per 24 hour  Intake 1697.5 ml  Output   2225 ml  Net -527.5 ml     Exam: General: No acute respiratory distress Lungs: Clear to auscultation bilaterally without wheezes or crackles, RA Cardiovascular: Irregular rate and rhythm without murmur gallop or rub normal S1 and S2, no peripheral edema or JVD Abdomen: Nontender, nondistended, soft, bowel sounds positive, no rebound, no ascites, no appreciable mass, no current hematochezia seen Musculoskeletal: No significant cyanosis, clubbing of bilateral lower extremities Neurological: Awakens but not oriented, moves all extremities x 4 without focal neurological deficits, CN 2-12 intact  Scheduled Meds:  Scheduled Meds: . levothyroxine  56 mcg Intravenous Daily  . metoprolol  2.5 mg Intravenous Q8H  . pantoprazole (PROTONIX) IV  40 mg Intravenous Q24H  . sodium chloride  3 mL Intravenous Q12H   Continuous Infusions: . sodium chloride 100 mL/hr at 08/08/13 1059    **Reviewed in detail by the Attending Physician  Data Reviewed: Basic Metabolic Panel:  Recent Labs Lab 08/07/13 1617 08/07/13 1643 08/08/13 0740  NA 141 144 144  K 4.2 4.2 4.0  CL 107 105 112  CO2 25  --  22  GLUCOSE 110* 110* 88  BUN 67* 85* 69*  CREATININE 2.15* 2.30* 2.18*  CALCIUM  7.8*  --  7.4*   Liver Function Tests:  Recent Labs Lab 08/07/13 1617 08/08/13 0740  AST 29 17  ALT 10 7  ALKPHOS 79 65  BILITOT 0.4 0.8  PROT 6.4 5.2*  ALBUMIN 1.7* 1.5*   No results found for this basename: LIPASE, AMYLASE,  in the last 168 hours No results found for this basename: AMMONIA,  in the last 168 hours CBC:  Recent Labs Lab 08/07/13 1617 08/07/13 1643 08/08/13 0740  WBC 6.2  --   --   NEUTROABS 4.6  --   --   HGB 8.5* 9.9* 9.2*  HCT 27.2* 29.0* 28.3*  MCV 100.4*  --   --   PLT 189  --   --    Cardiac Enzymes: No results found for this basename: CKTOTAL, CKMB, CKMBINDEX, TROPONINI,  in the last 168 hours BNP (last 3 results)  Recent Labs  07/27/13 1130  PROBNP 10117.0*   CBG:  Recent Labs Lab 08/07/13 2020  GLUCAP 80    Recent Results (from the past 240 hour(s))  MRSA PCR SCREENING     Status: None   Collection Time    08/07/13  8:36 PM      Result Value Range Status   MRSA by PCR NEGATIVE  NEGATIVE Final   Comment:            The GeneXpert MRSA Assay (FDA     approved for NASAL specimens     only), is one component of a     comprehensive MRSA colonization     surveillance program. It is not     intended to diagnose MRSA     infection nor to guide or     monitor treatment for     MRSA infections.     Studies:  Recent x-ray studies have been reviewed in detail by the Attending Physician     Junious Silk, ANP [facility] Office  [phone] Pager [phone]  **If unable to reach the above provider after paging please contact the Flow Manager @ [phone]  On-Call/Text Page:      [email or web address]      password [email or web address]  If 7PM-7AM, please contact night-coverage [email or web address] Password [email or web address] 08/08/2013, 11:23 AM   LOS: 1 day   I have examined the patient, reviewed the chart and modified the above note which I agree with.   Rima Blizzard,MD [phone] 08/08/2013, 4:33 PM

## 2013-08-09 LAB — CBC
HCT: 27.6 % — ABNORMAL LOW (ref 36.0–46.0)
MCV: 98.2 fL (ref 78.0–100.0)
Platelets: 153 10*3/uL (ref 150–400)
RBC: 2.81 MIL/uL — ABNORMAL LOW (ref 3.87–5.11)
WBC: 6.2 10*3/uL (ref 4.0–10.5)

## 2013-08-09 LAB — BASIC METABOLIC PANEL
BUN: 68 mg/dL — ABNORMAL HIGH (ref 6–23)
Chloride: 113 mEq/L — ABNORMAL HIGH (ref 96–112)
GFR calc Af Amer: 21 mL/min — ABNORMAL LOW (ref >90)
Glucose, Bld: 82 mg/dL (ref 70–99)
Potassium: 4.4 mEq/L (ref 3.5–5.1)
Sodium: 143 mEq/L (ref 135–145)

## 2013-08-09 LAB — TYPE AND SCREEN
DAT, IgG: NEGATIVE
Donor AG Type: NEGATIVE
Donor AG Type: NEGATIVE
PT AG Type: NEGATIVE
Unit division: 0

## 2013-08-09 LAB — HEMOGLOBIN AND HEMATOCRIT, BLOOD
Hemoglobin: 8.7 g/dL — ABNORMAL LOW (ref 12.0–15.0)
Hemoglobin: 9.4 g/dL — ABNORMAL LOW (ref 12.0–15.0)

## 2013-08-09 MED ORDER — PANTOPRAZOLE SODIUM 40 MG PO TBEC
40.0000 mg | DELAYED_RELEASE_TABLET | Freq: Every day | ORAL | Status: DC
  Administered 2013-08-09 – 2013-08-10 (×2): 40 mg via ORAL
  Filled 2013-08-09 (×2): qty 1

## 2013-08-09 MED ORDER — LEVOTHYROXINE SODIUM 112 MCG PO TABS
112.0000 ug | ORAL_TABLET | Freq: Every day | ORAL | Status: DC
  Administered 2013-08-10: 112 ug via ORAL
  Filled 2013-08-09 (×3): qty 1

## 2013-08-09 MED ORDER — METOPROLOL TARTRATE 12.5 MG HALF TABLET
12.5000 mg | ORAL_TABLET | Freq: Two times a day (BID) | ORAL | Status: DC
  Administered 2013-08-09 – 2013-08-10 (×2): 12.5 mg via ORAL
  Filled 2013-08-09 (×3): qty 1

## 2013-08-09 MED ORDER — FERROUS SULFATE 325 (65 FE) MG PO TABS
325.0000 mg | ORAL_TABLET | Freq: Every day | ORAL | Status: DC
  Administered 2013-08-09 – 2013-08-10 (×2): 325 mg via ORAL
  Filled 2013-08-09 (×2): qty 1

## 2013-08-09 MED ORDER — DONEPEZIL HCL 10 MG PO TABS
10.0000 mg | ORAL_TABLET | Freq: Every day | ORAL | Status: DC
  Administered 2013-08-09: 10 mg via ORAL
  Filled 2013-08-09 (×2): qty 1

## 2013-08-09 MED ORDER — FAMOTIDINE 10 MG PO TABS
10.0000 mg | ORAL_TABLET | Freq: Every day | ORAL | Status: DC
  Administered 2013-08-09 – 2013-08-10 (×2): 10 mg via ORAL
  Filled 2013-08-09 (×2): qty 1

## 2013-08-09 MED ORDER — VITAMIN D3 25 MCG (1000 UNIT) PO TABS
2000.0000 [IU] | ORAL_TABLET | Freq: Every day | ORAL | Status: DC
  Administered 2013-08-09 – 2013-08-10 (×2): 2000 [IU] via ORAL
  Filled 2013-08-09 (×2): qty 2

## 2013-08-09 NOTE — Progress Notes (Signed)
Patient ID: Emily Peters, female   DOB: 1914-11-04, 79 y.o.   MRN: [id number] [facility] Gastroenterology Progress Note  Emily Peters 79 y.o. [birth year]   Subjective: Small black stool overnight per nursing. Hgb stable at 8.7. No complaints.  Objective: Vital signs in last 24 hours: Filed Vitals:   08/09/13 0800  BP: 108/63  Pulse: 88  Temp: 97.6 F (36.4 C)  Resp: 25    Physical Exam: Gen: frail, cachetic, elderly, no acute distress Abd: epigastric tenderness with guarding, soft, nondistended, +BS  Lab Results:  Recent Labs  08/08/13 0740 08/09/13 0030  NA 144 143  K 4.0 4.4  CL 112 113*  CO2 22 21  GLUCOSE 88 82  BUN 69* 68*  CREATININE 2.18* 2.15*  CALCIUM 7.4* 7.3*    Recent Labs  08/07/13 1617 08/08/13 0740  AST 29 17  ALT 10 7  ALKPHOS 79 65  BILITOT 0.4 0.8  PROT 6.4 5.2*  ALBUMIN 1.7* 1.5*    Recent Labs  08/07/13 1617  08/08/13 1654 08/09/13 0030  WBC 6.2  --   --   --   NEUTROABS 4.6  --   --   --   HGB 8.5*  < > 9.3* 8.7*  HCT 27.2*  < > 27.8* 26.6*  MCV 100.4*  --   --   --   PLT 189  --   --   --   < > = values in this interval not displayed. No results found for this basename: LABPROT, INR,  in the last 72 hours    Assessment/Plan: 79 yo s/p lower GI bleed likely diverticular that is resolving. Hgb stable from admit. Advance diet. Supportive care. Will sign off. Call if questions.   Kashari Chalmers C. 08/09/2013, 8:44 AM

## 2013-08-09 NOTE — Progress Notes (Signed)
[facility] Progress Note [place] TEAM 1 - Stepdown ICU Team   Emily Peters WUJ:[id number] DOB: Aug 27, 1915 DOA: 08/07/2013 PCP: Fredderick Severance, MD  Brief narrative: 79 y.o. female SNF resident sent to the ER for rectal bleeding. It was reported that patient began having bleeding on the morning of admission and this had been continuous. Her son stated that for 2-3 days she complained of vague abdominal pain. She recently was constipated and had been given a laxative at the nursing facility several days prior her sx began. No nausea or vomiting reported; also no hematemesis.   She was seen in the ED and initial hemoglobin was 8.5, recheck hemoglobin 9.9, BUN 85 with a creatinine of 2.3. Per EDP patient had been bleeding continuously while in ED-dark/maroon blood per rectum. She was typed and crossed and 2 units of packed red blood cells ordered and [facility] consulted. BP stable in the ED 115/48, pulse in the 80s.   Assessment/Plan:    Rectal bleed -GI following-resolving -susppect due to diverticula or stercoral ulcer in setting of recent constipation -GI advancing diet and have signed off -NO plans for invasive procedure at this time    Acute blood loss anemia/ ANEMIA, PERNICIOUS -hgb stable after transfusion -check serial CBC - rpt at 6 pm tonight  Iron deficiency  -IV Iron x 2 doses ordered -resume home oral iron    CKD (chronic kidney disease) stage 4, GFR 15-29 ml/min -renal function stable and at baseline   Atrial fibrillation -given advanced age and current GIB not a Coumadin candidate    HYPERTENSION -BP soft in setting of ABL anemia -will allow low dose IV Lopressor to prevent RVR but watch closely since BP soft   GERD -decrease PPI to daily and resume oral route    CHF, chronic (mild systolic EF40% and grade 2 DD) -compensated    HYPOTHYROIDISM -cont Synthroid    DEMENTIA -plan is to dc to SNF when medically stable   DVT prophylaxis: SCDs Code  Status: DO NOT RESUSCITATE Family Communication: no family at bedside at time of exam today  Disposition Plan/Expected LOS: Transfer to med bed - advance diet - follow serial CBC - ultimate return to SNF when Hgb stable and tolerating regular diet   Consultants: Gastroenterology  Procedures: None  Antibiotics: None  HPI/Subjective: Patient awakens and endorses wants to go home. No family in room. Detailed hx not able to be accomplished due to dementia.  Objective: Blood pressure 134/94, pulse 102, temperature 97.6 F (36.4 C), temperature source Oral, resp. rate 24, height 5\' 2"  (1.575 m), weight 134 lb 7.7 oz (61 kg), SpO2 99.00%.  Intake/Output Summary (Last 24 hours) at 08/09/13 1229 Last data filed at 08/09/13 0800  Gross per 24 hour  Intake   2315 ml  Output    525 ml  Net   1790 ml    Exam: General: No acute respiratory distress Lungs: Clear to auscultation bilaterally without wheezes or crackles, RA Cardiovascular: Irregular rate and rhythm without murmur gallop or rub normal S1 and S2, no peripheral edema or JVD Abdomen: Nontender, nondistended, soft, bowel sounds positive, no rebound, no ascites, no appreciable mass, no current hematochezia seen Musculoskeletal: No significant cyanosis, clubbing of bilateral lower extremities Neurological: Awakens but not oriented, moves all extremities x 4 without focal neurological deficits  Scheduled Meds:  Scheduled Meds: . cholecalciferol  2,000 Units Oral Daily  . donepezil  10 mg Oral QHS  . famotidine  10 mg Oral Daily  .  ferrous sulfate  325 mg Oral Daily  . iron dextran (INFED/DEXFERRUM) infusion  250 mg Intravenous Q24H  . levothyroxine  112 mcg Oral QAC breakfast  . metoprolol  2.5 mg Intravenous Q8H  . pantoprazole  40 mg Oral Daily  . sodium chloride  3 mL Intravenous Q12H    Data Reviewed: Basic Metabolic Panel:  Recent Labs Lab 08/07/13 1617 08/07/13 1643 08/08/13 0740 08/09/13 0030  NA 141 144 144  143  K 4.2 4.2 4.0 4.4  CL 107 105 112 113*  CO2 25  --  22 21  GLUCOSE 110* 110* 88 82  BUN 67* 85* 69* 68*  CREATININE 2.15* 2.30* 2.18* 2.15*  CALCIUM 7.8*  --  7.4* 7.3*   Liver Function Tests:  Recent Labs Lab 08/07/13 1617 08/08/13 0740  AST 29 17  ALT 10 7  ALKPHOS 79 65  BILITOT 0.4 0.8  PROT 6.4 5.2*  ALBUMIN 1.7* 1.5*   CBC:  Recent Labs Lab 08/07/13 1617 08/07/13 1643 08/08/13 0740 08/08/13 1654 08/09/13 0030 08/09/13 0904  WBC 6.2  --   --   --   --   --   NEUTROABS 4.6  --   --   --   --   --   HGB 8.5* 9.9* 9.2* 9.3* 8.7* 9.4*  HCT 27.2* 29.0* 28.3* 27.8* 26.6* 29.1*  MCV 100.4*  --   --   --   --   --   PLT 189  --   --   --   --   --    BNP (last 3 results)  Recent Labs  07/27/13 1130  PROBNP 10117.0*   CBG:  Recent Labs Lab 08/07/13 2020  GLUCAP 80    Recent Results (from the past 240 hour(s))  MRSA PCR SCREENING     Status: None   Collection Time    08/07/13  8:36 PM      Result Value Range Status   MRSA by PCR NEGATIVE  NEGATIVE Final   Comment:            The GeneXpert MRSA Assay (FDA     approved for NASAL specimens     only), is one component of a     comprehensive MRSA colonization     surveillance program. It is not     intended to diagnose MRSA     infection nor to guide or     monitor treatment for     MRSA infections.     Studies:  Recent x-ray studies have been reviewed in detail by the Attending Physician     Junious Silk, ANP [facility] Office  [phone] Pager [phone]  **If unable to reach the above provider after paging please contact the Flow Manager @ [phone]  On-Call/Text Page:      [email or web address]      password [email or web address]  If 7PM-7AM, please contact night-coverage [email or web address] Password [email or web address] 08/09/2013, 12:29 PM   LOS: 2 days   I have personally examined this patient and reviewed the entire database. I have reviewed the above note, made any necessary editorial changes, and agree  with its content.  Lonia Blood, MD [facility]

## 2013-08-09 NOTE — Clinical Social Work Note (Signed)
CSW called pt's son [name] and informed him that CSW has left a list of all the SNFs that can take pt when she is ready for discharge in the pt's room. [name] explained that they are now considering [facility] or [facility] and that a consideration in their decision is whether the facility has long-term beds available or the potential to have long-term beds when pt might need this in the future. [name] will choose a SNF on pt's discharge day, and CSW provided her phone number in the event family makes a decision before this date.   Maryclare Labrador, MSW, [facility] Clinical Social Worker [phone]

## 2013-08-10 LAB — CBC
Hemoglobin: 9.3 g/dL — ABNORMAL LOW (ref 12.0–15.0)
MCH: 32 pg (ref 26.0–34.0)
MCHC: 32.3 g/dL (ref 30.0–36.0)
MCV: 99 fL (ref 78.0–100.0)
Platelets: 142 10*3/uL — ABNORMAL LOW (ref 150–400)
RBC: 2.91 MIL/uL — ABNORMAL LOW (ref 3.87–5.11)
RDW: 19.4 % — ABNORMAL HIGH (ref 11.5–15.5)

## 2013-08-10 MED ORDER — POLYETHYLENE GLYCOL 3350 17 G PO PACK: 17.0000 g | PACK | Freq: Every day | ORAL | Status: DC

## 2013-08-10 MED ORDER — DOCUSATE SODIUM 100 MG PO CAPS: 100.0000 mg | ORAL_CAPSULE | Freq: Two times a day (BID) | ORAL | Status: DC

## 2013-08-10 NOTE — Discharge Summary (Signed)
Physician Discharge Summary  Emily Peters ION:[id number] DOB: [birth year] DOA: 08/07/2013  PCP: Fredderick Severance, MD  Admit date: 08/07/2013 Discharge date: 08/10/2013  Time spent: 30 minutes  Recommendations for Outpatient Follow-up:  1. Avoid constipation. Return to ER if bleeding recurs. 2. Per family request recommend PT/OT after arrival  Discharge Diagnoses:  Active Problems:   Rectal bleed due to constipation   Acute blood loss anemia post transfusion   CKD (chronic kidney disease) stage 4, GFR 15-29 ml/min   ANEMIA, PERNICIOUS   HYPERTENSION   CHF, chronic (mild systolic EF40% and grade 2 DD)-compensated   HYPOTHYROIDISM   DEMENTIA   Discharge Condition: stable  Diet recommendation: Mechanical soft diet avoid raw vegetables and fruits with skins for 2 weeks  Filed Weights   08/08/13 0420 08/09/13 0300 08/09/13 2032  Weight: 132 lb 0.9 oz (59.9 kg) 134 lb 7.7 oz (61 kg) 145 lb 4.5 oz (65.9 kg)    History of present illness:  79 y.o. female SNF resident sent to the ER for rectal bleeding. It was reported that patient began having bleeding on the morning of admission and this had been continuous. Her son stated that for 2-3 days she complained of vague abdominal pain. She recently was constipated and had been given a laxative at the nursing facility several days prior her sx began. No nausea or vomiting reported; also no hematemesis.  She was seen in the ED and initial hemoglobin was 8.5, recheck hemoglobin 9.9, BUN 85 with a creatinine of 2.3. Per EDP patient had been bleeding continuously while in ED-dark/maroon blood per rectum. She was typed and crossed and 2 units of packed red blood cells ordered and [facility] consulted. BP stable in the ED 115/48, pulse in the 80s.    Hospital Course:  Rectal bleed  -GI following-resolving  -susppect due to diverticula or stercoral ulcer in setting of recent constipation  -primary treatment is prevention by maximizing constipation  therapy-Miralax and Colace added at dc -GI advanced diet and have signed off  -NO plans for invasive procedure at this time   Acute blood loss anemia/ ANEMIA, PERNICIOUS  -hgb stable after transfusion  -hgb stable at 9.3 at dc  Iron deficiency  -iron was 135 but UIBC very low at <15 -IV Iron x 2 doses ordered  -resumed home oral iron   CKD (chronic kidney disease) stage 4, GFR 15-29 ml/min  -renal function stable and at baseline   Atrial fibrillation  -given advanced age and current GIB not a Coumadin candidate   HYPERTENSION  -BP was soft in setting of ABL anemia  -was given IV Lopressor to prevent RVR  -BP back to baseline so home dose Lopressor resumed  GERD  -cont PPI daily   CHF, chronic (mild systolic EF40% and grade 2 DD)  -compensated   HYPOTHYROIDISM  -cont Synthroid   DEMENTIA  -PT/OT after returns to SNF    Procedures:  None   Consultations: Gastroenterology   Discharge Exam: Filed Vitals:   08/10/13 0941  BP: 137/60  Pulse: 79  Temp: 97.8 F (36.6 C)  Resp: 17     General: No acute respiratory distress  Lungs: Clear to auscultation bilaterally without wheezes or crackles, RA  Cardiovascular: Irregular rate and rhythm without murmur gallop or rub normal S1 and S2, no peripheral edema or JVD  Abdomen: Nontender, nondistended, soft, bowel sounds positive, no rebound, no ascites, no appreciable mass, no recurrent hematochezia seen  Musculoskeletal: No significant cyanosis, clubbing of bilateral  lower extremities  Neurological: Awakens but not oriented, moves all extremities x 4 without focal neurological deficits   Discharge Instructions      Discharge Orders   Future Orders Complete By Expires   (HEART FAILURE PATIENTS) Call MD:  Anytime you have any of the following symptoms: 1) 3 pound weight gain in 24 hours or 5 pounds in 1 week 2) shortness of breath, with or without a dry hacking cough 3) swelling in the hands, feet or stomach 4)  if you have to sleep on extra pillows at night in order to breathe.  As directed    Call MD for:  extreme fatigue  As directed    Call MD for:  persistant dizziness or light-headedness  As directed    Call MD for:  severe uncontrolled pain  As directed    Call MD for:  temperature >100.4  As directed    Call MD for:  As directed    Scheduling Instructions:     RECURRENT RECTAL BLEEDING   Diet general  As directed    Scheduling Instructions:     Mechanical soft- no raw fruit with skins or vegetables for 2 weeks   Increase activity slowly  As directed        Medication List         acetaminophen 325 MG tablet  Commonly known as:  TYLENOL  Take 650 mg by mouth every 6 (six) hours as needed for mild pain, fever or headache.     aspirin 81 MG tablet  Take 81 mg by mouth daily.     cholecalciferol 1000 UNITS tablet  Commonly known as:  VITAMIN D  Take 2,000 Units by mouth daily.     docusate sodium 100 MG capsule  Commonly known as:  COLACE  Take 1 capsule (100 mg total) by mouth 2 (two) times daily.     donepezil 10 MG tablet  Commonly known as:  ARICEPT  Take 10 mg by mouth at bedtime.     eucerin cream  Apply 1 application topically 3 (three) times daily as needed for dry skin.     ferrous sulfate 325 (65 FE) MG tablet  Take 1 tablet (325 mg total) by mouth daily.     furosemide 40 MG tablet  Commonly known as:  LASIX  Take 1 tablet (40 mg total) by mouth daily.     levothyroxine 112 MCG tablet  Commonly known as:  SYNTHROID, LEVOTHROID  Take 112 mcg by mouth daily before breakfast.     magnesium hydroxide 400 MG/5ML suspension  Commonly known as:  MILK OF MAGNESIA  Take 30 mLs by mouth daily as needed for mild constipation.     metoprolol 50 MG tablet  Commonly known as:  LOPRESSOR  Take 50 mg by mouth 2 (two) times daily.     omeprazole 40 MG capsule  Commonly known as:  PRILOSEC  Take 40 mg by mouth daily.     polyethylene glycol packet  Commonly known  as:  MIRALAX / GLYCOLAX  Take 17 g by mouth daily.     ranitidine 150 MG tablet  Commonly known as:  ZANTAC  Take 150 mg by mouth daily.     VITAMIN B-12 IJ  Inject 1 application as directed every 30 (thirty) days.       No Known Allergies Follow-up Information   Follow up with Fredderick Severance, MD. (As needed- primary post hospital care per SNF physician)    Specialty:  Family Medicine   Contact information:   [address] [place] [address] [phone]        The results of significant diagnostics from this hospitalization (including imaging, microbiology, ancillary and laboratory) are listed below for reference.    Significant Diagnostic Studies: Ct Abdomen Pelvis Wo Contrast  08/07/2013   CLINICAL DATA:  74 old female with multiple medical problems. Patient cannot provide history.  EXAM: CT ABDOMEN AND PELVIS WITHOUT CONTRAST  TECHNIQUE: Multidetector CT imaging of the abdomen and pelvis was performed following the standard protocol without intravenous contrast.  COMPARISON:  None.  FINDINGS: Moderate enlargement of the cardiac silhouette. Moderate hiatal hernia. Small left and small moderate right pleural effusions. Minor dependent subsegmental atelectasis at the lung bases.  Two low-density liver lesions, 1 the left lobe and the larger in the posterior segment of the right lobe measuring 2.4 cm. These are likely cysts. Spleen is unremarkable.  Gallbladder is unremarkable. Normal pancreas. No bile duct dilation. No adrenal masses.  Bilateral renal cortical thinning. Large presumed cyst arises from the upper pole right kidney measuring 4.5 cm. There is a subtle lesion with evidence of minimal peripheral calcification arising from the upper pole of the left kidney also likely a cyst. No other renal masses. There are dilated intrarenal collecting system in ureters, right greater than left. There is no ureteral stone. The bladder is distended. No bladder mass or  wall thickening is seen. The renal collecting system dilation is felt most likely to be due to transmitted pressure from the distended bladder.  Uterus is surgically absent. No pelvic masses. No pathologically enlarged lymph nodes. No ascites.  There is significant stool distending the rectum with mild increased stool throughout the colon. No colonic wall thickening or inflammatory changes are seen. There are diverticula scattered throughout the colon. Appendix not visualized. Small bowel is unremarkable.  There are degenerative changes throughout the visualized spine. The bones are diffusely demineralized. Compression fractures are noted of T10 and T11, likely old. No osteoblastic or osteolytic lesions.  IMPRESSION: 1. No convincing acute abnormality within the abdomen or pelvis. 2. Stool significantly distends the rectum with mild increased stool noted throughout the colon. 3. Bladder is distended. This is felt to lead to bilateral renal collecting system ureteral dilation, greater on the right. 4. Small to moderate right and small left pleural effusions with mild associated subsegmental atelectasis. Moderate cardiomegaly. Moderate hiatal hernia. 5. Presumed renal and liver cysts. Colonic diverticulosis without diverticulitis. Extensive degenerative changes and demineralization of the visualized spine. Compression fractures of T10 and T11, presumed to be old.   Electronically Signed   By: Amie Portland M.D.   On: 08/07/2013 15:37   Dg Chest 2 View  07/28/2013   CLINICAL DATA:  Dyspnea with CHF.  EXAM: CHEST  2 VIEW  COMPARISON:  07/27/2013  FINDINGS: The cardio pericardial silhouette is enlarged. Pulmonary vascular congestion is again noted. Small bilateral pleural effusions are evident. Telemetry leads overlie the chest. Multiple right-sided rib fractures again noted. Hazy opacity overlying the right mid lung is presumed to be secondary to pleural fluid.  IMPRESSION: Marked enlargement of the cardiopericardial  silhouette with small bilateral pleural effusions and associated vascular congestion.   Electronically Signed   By: Kennith Center M.D.   On: 07/28/2013 12:41   Dg Hip Complete Right  07/27/2013   CLINICAL DATA:  Right hip discomfort without history of injury.  EXAM: RIGHT HIP - COMPLETE 2+ VIEW  COMPARISON:  June 02, 2012.  FINDINGS: The proximal femurs and bony pelvis are osteopenic. There is no evidence of an acute hip fracture or dislocation. There is mild symmetric narrowing of both hip joint spaces. No fracture of the visualized portions of the pelvis is demonstrated. There are degenerative changes of the lower lumbar spine. The soft tissues of the pelvis exhibit no acute abnormalities.  IMPRESSION: There are no findings to suggest an acute fracture of the right hip. There are mild degenerative changes present.   Electronically Signed   By: David  [place]   On: 07/27/2013 16:10   Dg Chest Port 1 View  07/27/2013   CLINICAL DATA:  Chest pain  EXAM: PORTABLE CHEST - 1 VIEW  COMPARISON:  01/13/2013  FINDINGS: Cardiomegaly is noted. Central mild vascular congestion without pulmonary edema. Question small right pleural effusion with right basilar atelectasis or infiltrate. Mild left basilar atelectasis. Old right rib fractures.  IMPRESSION: Central mild vascular congestion without pulmonary edema. Question small right pleural effusion with right basilar atelectasis or infiltrate. Mild left basilar atelectasis.   Electronically Signed   By: Natasha Mead M.D.   On: 07/27/2013 11:51    Microbiology: Recent Results (from the past 240 hour(s))  MRSA PCR SCREENING     Status: None   Collection Time    08/07/13  8:36 PM      Result Value Range Status   MRSA by PCR NEGATIVE  NEGATIVE Final   Comment:            The GeneXpert MRSA Assay (FDA     approved for NASAL specimens     only), is one component of a     comprehensive MRSA colonization     surveillance program. It is not     intended to diagnose  MRSA     infection nor to guide or     monitor treatment for     MRSA infections.     Labs: Basic Metabolic Panel:  Recent Labs Lab 08/07/13 1617 08/07/13 1643 08/08/13 0740 08/09/13 0030  NA 141 144 144 143  K 4.2 4.2 4.0 4.4  CL 107 105 112 113*  CO2 25  --  22 21  GLUCOSE 110* 110* 88 82  BUN 67* 85* 69* 68*  CREATININE 2.15* 2.30* 2.18* 2.15*  CALCIUM 7.8*  --  7.4* 7.3*   Liver Function Tests:  Recent Labs Lab 08/07/13 1617 08/08/13 0740  AST 29 17  ALT 10 7  ALKPHOS 79 65  BILITOT 0.4 0.8  PROT 6.4 5.2*  ALBUMIN 1.7* 1.5*   No results found for this basename: LIPASE, AMYLASE,  in the last 168 hours No results found for this basename: AMMONIA,  in the last 168 hours CBC:  Recent Labs Lab 08/07/13 1617  08/08/13 1654 08/09/13 0030 08/09/13 0904 08/09/13 1900 08/10/13 0658  WBC 6.2  --   --   --   --  6.2 5.3  NEUTROABS 4.6  --   --   --   --   --   --   HGB 8.5*  < > 9.3* 8.7* 9.4* 8.8* 9.3*  HCT 27.2*  < > 27.8* 26.6* 29.1* 27.6* 28.8*  MCV 100.4*  --   --   --   --  98.2 99.0  PLT 189  --   --   --   --  153 142*  < > = values in this interval not displayed. Cardiac Enzymes: No results found for this basename: CKTOTAL,  CKMB, CKMBINDEX, TROPONINI,  in the last 168 hours BNP: BNP (last 3 results)  Recent Labs  07/27/13 1130  PROBNP 10117.0*   CBG:  Recent Labs Lab 08/07/13 2020  GLUCAP 80       Signed:  ELLIS,ALLISON L. ANP [facility] 08/10/2013, 12:29 PM   I have examined the patient, reviewed the chart and modified the above note which I agree with.   Rosea Dory,MD [phone] 08/25/2013, 9:37 AM

## 2013-08-14 ENCOUNTER — Encounter: Payer: Self-pay | Admitting: Internal Medicine

## 2013-08-14 ENCOUNTER — Non-Acute Institutional Stay (SKILLED_NURSING_FACILITY): Payer: Medicare Other | Admitting: Internal Medicine

## 2013-08-14 DIAGNOSIS — F068 Other specified mental disorders due to known physiological condition: Secondary | ICD-10-CM

## 2013-08-14 DIAGNOSIS — I4891 Unspecified atrial fibrillation: Secondary | ICD-10-CM

## 2013-08-14 DIAGNOSIS — F0390 Unspecified dementia without behavioral disturbance: Secondary | ICD-10-CM

## 2013-08-14 DIAGNOSIS — D62 Acute posthemorrhagic anemia: Secondary | ICD-10-CM

## 2013-08-14 DIAGNOSIS — E538 Deficiency of other specified B group vitamins: Secondary | ICD-10-CM

## 2013-08-14 DIAGNOSIS — E039 Hypothyroidism, unspecified: Secondary | ICD-10-CM

## 2013-08-14 DIAGNOSIS — K219 Gastro-esophageal reflux disease without esophagitis: Secondary | ICD-10-CM

## 2013-08-14 DIAGNOSIS — I10 Essential (primary) hypertension: Secondary | ICD-10-CM

## 2013-08-14 DIAGNOSIS — I509 Heart failure, unspecified: Secondary | ICD-10-CM

## 2013-08-14 DIAGNOSIS — N184 Chronic kidney disease, stage 4 (severe): Secondary | ICD-10-CM

## 2013-08-14 DIAGNOSIS — K625 Hemorrhage of anus and rectum: Secondary | ICD-10-CM

## 2013-08-14 NOTE — Assessment & Plan Note (Signed)
Will continue injections

## 2013-08-14 NOTE — Assessment & Plan Note (Signed)
See above; pt was given 2 doses iron in hospital and will continue here

## 2013-08-14 NOTE — Progress Notes (Signed)
MRN: [id number] Name: Emily Peters  Sex: female Age: 79 y.o. DOB: 1915/04/26  PSC #: [facility] Facility/Room: 305B Level Of Care: SNF Provider: Merrilee Seashore D Emergency Contacts: Extended Emergency Contact Information Primary Emergency Contact: Bevans,[name] W Address: [address]          [place], [place] [address] [place] Home Phone: [phone] Mobile Phone: [phone] Relation: Son Secondary Emergency Contact: Bonfanti,Hope Address: [address]          [place], [place] [address] [place] Home Phone: [phone] Relation: Daughter  Code Status: DNR  Allergies: Review of patient's allergies indicates no known allergies.  Chief Complaint  Patient presents with  . nursing home admission    HPI: Patient is 79 y.o. female who is here for support after a lower GI bleed.  Past Medical History  Diagnosis Date  . PAC (premature atrial contraction)   . Thyroid disease   . Hypertension   . Dementia   . Cataract   . Acid reflux   . CHF (congestive heart failure)   . Renal disorder   . Hypothyroidism   . Arthritis   . Peripheral vascular disease     Past Surgical History  Procedure Laterality Date  . Abdominal hysterectomy    . Eye surgery    . Esophageal dilation      x 2      Medication List       This list is accurate as of: 08/14/13  4:32 PM.  Always use your most recent med list.               acetaminophen 325 MG tablet  Commonly known as:  TYLENOL  Take 650 mg by mouth every 6 (six) hours as needed for mild pain, fever or headache.     aspirin 81 MG tablet  Take 81 mg by mouth daily.     cholecalciferol 1000 UNITS tablet  Commonly known as:  VITAMIN D  Take 2,000 Units by mouth daily.     docusate sodium 100 MG capsule  Commonly known as:  COLACE  Take 1 capsule (100 mg total) by mouth 2 (two) times daily.     donepezil 10 MG tablet  Commonly known as:  ARICEPT  Take 10 mg by mouth at bedtime.     eucerin  cream  Apply 1 application topically 3 (three) times daily as needed for dry skin.     ferrous sulfate 325 (65 FE) MG tablet  Take 1 tablet (325 mg total) by mouth daily.     furosemide 40 MG tablet  Commonly known as:  LASIX  Take 1 tablet (40 mg total) by mouth daily.     levothyroxine 112 MCG tablet  Commonly known as:  SYNTHROID, LEVOTHROID  Take 112 mcg by mouth daily before breakfast.     magnesium hydroxide 400 MG/5ML suspension  Commonly known as:  MILK OF MAGNESIA  Take 30 mLs by mouth daily as needed for mild constipation.     metoprolol 50 MG tablet  Commonly known as:  LOPRESSOR  Take 50 mg by mouth 2 (two) times daily.     omeprazole 40 MG capsule  Commonly known as:  PRILOSEC  Take 40 mg by mouth daily.     polyethylene glycol packet  Commonly known as:  MIRALAX / GLYCOLAX  Take 17 g by mouth daily.     ranitidine 150 MG tablet  Commonly known as:  ZANTAC  Take 150 mg  by mouth daily.     VITAMIN B-12 IJ  Inject 1 application as directed every 30 (thirty) days.        No orders of the defined types were placed in this encounter.    Immunization History  Administered Date(s) Administered  . Influenza Whole 06/17/2009  . Influenza-Unspecified 06/19/2013  . Pneumococcal Polysaccharide-23 01/28/2009  . Td 01/28/2009  . Tdap 06/02/2012    History  Substance Use Topics  . Smoking status: Never Smoker   . Smokeless tobacco: Never Used  . Alcohol Use: No    Family history is noncontributory    Review of Systems- unable sec to pt dementia-nurses do not reprt problems   Filed Vitals:   08/14/13 1409  BP: 112/64  Pulse: 76  Temp: 98.3 F (36.8 C)  Resp: 18    Physical Exam  GENERAL APPEARANCE: Alert, nonconversant. Appropriately groomed. No acute distress.  SKIN: No diaphoresis rash HEAD: Normocephalic, atraumatic  EYES: Conjunctiva/lids clear. Pupils round, reactive. EOMs intact.  EARS: External exam WNL, canals clear. Hearing grossly  normal.  NOSE: No deformity or discharge.  MOUTH/THROAT: Lips w/o lesions.  RESPIRATORY: Breathing is even, unlabored. Lung sounds are clear   CARDIOVASCULAR: Heart irreg no murmurs, rubs or gallops. No peripheral edema.   GASTROINTESTINAL: Abdomen is soft, non-tender, not distended w/ very very active bowel sounds. GENITOURINARY: Bladder non tender, not distended  MUSCULOSKELETAL: No abnormal joints or musculature NEUROLOGIC: Oriented X3. Cranial nerves 2-12 grossly intact. Moves all extremities no tremor. PSYCHIATRIC: very demented, no behavioral issues  Patient Active Problem List   Diagnosis Date Noted  . Atrial fibrillation 08/14/2013  . CKD (chronic kidney disease) stage 4, GFR 15-29 ml/min 08/08/2013  . Rectal bleed 08/07/2013  . Acute blood loss anemia 08/07/2013  . Acute renal insufficiency 07/27/2013  . Failure to thrive 07/27/2013  . CHF, chronic (mild systolic EF40% and grade 2 DD) 07/27/2013  . Right hip pain 07/27/2013  . Peripheral vascular disease 07/27/2013  . RIB PAIN, RIGHT SIDED 08/13/2009  . HYPERTENSION 06/17/2009  . RENAL INSUFFICIENCY, CHRONIC 04/14/2009  . UTI 04/14/2009  . OSTEOPOROSIS 04/14/2009  . OTHER DYSPHAGIA 04/14/2009  . SPINAL STENOSIS, LUMBAR 03/29/2009  . B12 DEFICIENCY 03/15/2009  . ANEMIA, PERNICIOUS 03/15/2009  . INCONTINENCE 03/15/2009  . HYPOTHYROIDISM 02/11/2009  . Dementia arising in the senium and presenium 02/11/2009  . ESOPHAGEAL STRICTURE 02/11/2009  . GERD 02/11/2009  . ABNORMALITY OF GAIT 02/11/2009    CBC    Component Value Date/Time   WBC 5.3 08/10/2013 0658   RBC 2.91* 08/10/2013 0658   RBC 3.01* 08/08/2013 1010   HGB 9.3* 08/10/2013 0658   HCT 28.8* 08/10/2013 0658   PLT 142* 08/10/2013 0658   MCV 99.0 08/10/2013 0658   LYMPHSABS 1.0 08/07/2013 1617   MONOABS 0.6 08/07/2013 1617   EOSABS 0.0 08/07/2013 1617   BASOSABS 0.0 08/07/2013 1617    CMP     Component Value Date/Time   NA 143 08/09/2013 0030   K  4.4 08/09/2013 0030   CL 113* 08/09/2013 0030   CO2 21 08/09/2013 0030   GLUCOSE 82 08/09/2013 0030   BUN 68* 08/09/2013 0030   CREATININE 2.15* 08/09/2013 0030   CALCIUM 7.3* 08/09/2013 0030   PROT 5.2* 08/08/2013 0740   ALBUMIN 1.5* 08/08/2013 0740   AST 17 08/08/2013 0740   ALT 7 08/08/2013 0740   ALKPHOS 65 08/08/2013 0740   BILITOT 0.8 08/08/2013 0740   GFRNONAA 18* 08/09/2013 0030   GFRAA 21*  08/09/2013 0030    Assessment and Plan  Rectal bleed Maroon stool, continuous;pt was tx with 2 units PRBC; Hb at d/c was 9.3 and said to be stable;will repeat CBC 12/1  Acute blood loss anemia See above; pt was given 2 doses iron in hospital and will continue here  CKD (chronic kidney disease) stage 4, GFR 15-29 ml/min Renal fx stayed stable- BUN 67-85, Cr 2.15-2.30  Atrial fibrillation GIB, not a candidATE FOR COUMADIN;ON asa 81 MG   HYPERTENSION BP is adequate here-will continue current meds  GERD continue prilosec 40 mg daily   CHF, chronic (mild systolic EF40% and grade 2 DD) Felt to be compensated;on Bblocker and Lasix  HYPOTHYROIDISM Continue synthroid at 112 mcg  B12 DEFICIENCY Will continue injections  Dementia arising in the senium and presenium Continue aricept    Margit Hanks, MD

## 2013-08-14 NOTE — Assessment & Plan Note (Signed)
Continue synthroid at 112 mcg

## 2013-08-14 NOTE — Assessment & Plan Note (Signed)
Felt to be compensated;on Bblocker and Lasix

## 2013-08-14 NOTE — Assessment & Plan Note (Signed)
BP is adequate here-will continue current meds

## 2013-08-14 NOTE — Assessment & Plan Note (Signed)
GIB, not a candidATE FOR COUMADIN;ON asa 81 MG

## 2013-08-14 NOTE — Assessment & Plan Note (Addendum)
Maroon stool, continuous;pt was tx with 2 units PRBC; Hb at d/c was 9.3 and said to be stable;will repeat CBC 12/1

## 2013-08-14 NOTE — Assessment & Plan Note (Signed)
Continue aricept 

## 2013-08-14 NOTE — Assessment & Plan Note (Signed)
Renal fx stayed stable- BUN 67-85, Cr 2.15-2.30

## 2013-08-14 NOTE — Assessment & Plan Note (Signed)
-

## 2013-09-08 ENCOUNTER — Non-Acute Institutional Stay (SKILLED_NURSING_FACILITY): Payer: Medicare Other | Admitting: Nurse Practitioner

## 2013-09-08 DIAGNOSIS — F068 Other specified mental disorders due to known physiological condition: Secondary | ICD-10-CM

## 2013-09-08 DIAGNOSIS — F0390 Unspecified dementia without behavioral disturbance: Secondary | ICD-10-CM

## 2013-09-08 DIAGNOSIS — K5900 Constipation, unspecified: Secondary | ICD-10-CM

## 2013-09-08 DIAGNOSIS — N184 Chronic kidney disease, stage 4 (severe): Secondary | ICD-10-CM

## 2013-09-08 DIAGNOSIS — K219 Gastro-esophageal reflux disease without esophagitis: Secondary | ICD-10-CM

## 2013-09-08 DIAGNOSIS — I10 Essential (primary) hypertension: Secondary | ICD-10-CM

## 2013-09-08 DIAGNOSIS — D62 Acute posthemorrhagic anemia: Secondary | ICD-10-CM

## 2013-09-08 NOTE — Progress Notes (Signed)
Patient ID: Emily Peters, female   DOB: Dec 12, 1914, 79 y.o.   MRN: [id number]    Nursing Home Location:  [facility]   Place of Service: SNF (31)  PCP: Fredderick Severance, MD  No Known Allergies  Chief Complaint  Patient presents with  . Medical Managment of Chronic Issues    HPI:  79 year old who was admitted to [facility] after hospitalization for GI bleed is being seen today for routine follow up. Pt with pmh of GI bleed, anemia, iron deficiency, CKD stage 4, a fib, htn, GERD, CHF, hypothyroid, and dementia. Pt doing well in current environment and without complaints on today's visit; staff without concerns at this time. No further signs of bleeding and pt having regular BMs     Review of Systems:  Review of Systems  Unable to perform ROS: dementia     Past Medical History  Diagnosis Date  . PAC (premature atrial contraction)   . Thyroid disease   . Hypertension   . Dementia   . Cataract   . Acid reflux   . CHF (congestive heart failure)   . Renal disorder   . Hypothyroidism   . Arthritis   . Peripheral vascular disease    Past Surgical History  Procedure Laterality Date  . Abdominal hysterectomy    . Eye surgery    . Esophageal dilation      x 2   Social History:   reports that she has never smoked. She has never used smokeless tobacco. She reports that she does not drink alcohol or use illicit drugs.  Family History  Problem Relation Age of Onset  . Heart failure    . Heart attack      Medications: Patient's Medications  New Prescriptions   No medications on file  Previous Medications   ACETAMINOPHEN (TYLENOL) 325 MG TABLET    Take 650 mg by mouth every 6 (six) hours as needed for mild pain, fever or headache.   ASPIRIN 81 MG TABLET    Take 81 mg by mouth daily.   CHOLECALCIFEROL (VITAMIN D) 1000 UNITS TABLET    Take 2,000 Units by mouth daily.    CYANOCOBALAMIN (VITAMIN B-12 IJ)    Inject 1 application as directed every 30 (thirty) days.     DOCUSATE SODIUM (COLACE) 100 MG CAPSULE    Take 1 capsule (100 mg total) by mouth 2 (two) times daily.   DONEPEZIL (ARICEPT) 10 MG TABLET    Take 10 mg by mouth at bedtime.   FERROUS SULFATE 325 (65 FE) MG TABLET    Take 1 tablet (325 mg total) by mouth daily.   FUROSEMIDE (LASIX) 40 MG TABLET    Take 1 tablet (40 mg total) by mouth daily.   LEVOTHYROXINE (SYNTHROID, LEVOTHROID) 112 MCG TABLET    Take 112 mcg by mouth daily before breakfast.   MAGNESIUM HYDROXIDE (MILK OF MAGNESIA) 400 MG/5ML SUSPENSION    Take 30 mLs by mouth daily as needed for mild constipation.   METOPROLOL (LOPRESSOR) 50 MG TABLET    Take 50 mg by mouth 2 (two) times daily.   OMEPRAZOLE (PRILOSEC) 40 MG CAPSULE    Take 40 mg by mouth daily.   POLYETHYLENE GLYCOL (MIRALAX / GLYCOLAX) PACKET    Take 17 g by mouth daily.   RANITIDINE (ZANTAC) 150 MG TABLET    Take 150 mg by mouth daily.   SKIN PROTECTANTS, MISC. (EUCERIN) CREAM    Apply 1 application topically 3 (  three) times daily as needed for dry skin.   Modified Medications   No medications on file  Discontinued Medications   No medications on file     Physical Exam:  Filed Vitals:   09/08/13 1526  BP: 96/60  Pulse: 63  Temp: 97.3 F (36.3 C)  Resp: 20  SpO2: 97%   GENERAL APPEARANCE: Alert female in no acute distress.  SKIN: No diaphoresis rash  HEAD: Normocephalic, atraumatic  EYES: Conjunctiva/lids clear. Pupils round, reactive. EOMs intact.  EARS: External exam WNL.  NOSE: No deformity or discharge.  MOUTH/THROAT: Lips w/o lesions.  RESPIRATORY: Breathing is even, unlabored. Lung sounds are clear  CARDIOVASCULAR: Heart irreg no murmurs, rubs or gallops. No peripheral edema.  GASTROINTESTINAL: Abdomen is soft, non-tender, not distended w/ active bowel sounds.  GENITOURINARY: Bladder non tender, not distended  MUSCULOSKELETAL: No abnormal joints or musculature  NEUROLOGIC: Oriented X1. Moves all extremities no tremor.  PSYCHIATRIC: advanced memory  loss; no behavioral issues noted    Labs reviewed: Basic Metabolic Panel:  Recent Labs  45/40/98 1617 08/07/13 1643 08/08/13 0740 08/09/13 0030  NA 141 144 144 143  K 4.2 4.2 4.0 4.4  CL 107 105 112 113*  CO2 25  --  22 21  GLUCOSE 110* 110* 88 82  BUN 67* 85* 69* 68*  CREATININE 2.15* 2.30* 2.18* 2.15*  CALCIUM 7.8*  --  7.4* 7.3*   Liver Function Tests:  Recent Labs  07/27/13 1130 08/07/13 1617 08/08/13 0740  AST 45* 29 17  ALT 27 10 7   ALKPHOS 110 79 65  BILITOT 0.4 0.4 0.8  PROT 8.3 6.4 5.2*  ALBUMIN 2.4* 1.7* 1.5*    Recent Labs  07/27/13 1130  LIPASE 68*   No results found for this basename: AMMONIA,  in the last 8760 hours CBC:  Recent Labs  01/13/13 1318 07/27/13 1130  08/07/13 1617  08/09/13 0904 08/09/13 1900 08/10/13 0658  WBC 4.1 3.7*  < > 6.2  --   --  6.2 5.3  NEUTROABS 2.5 2.5  --  4.6  --   --   --   --   HGB 8.8* 9.4*  < > 8.5*  < > 9.4* 8.8* 9.3*  HCT 28.0* 29.2*  < > 27.2*  < > 29.1* 27.6* 28.8*  MCV 84.3 99.3  < > 100.4*  --   --  98.2 99.0  PLT 279 207  < > 189  --   --  153 142*  < > = values in this interval not displayed. Cardiac Enzymes:  Recent Labs  01/13/13 1318 07/27/13 1130  CKTOTAL  --  35  TROPONINI <0.30  --    BNP: No components found with this basename: POCBNP,  CBG:  Recent Labs  08/07/13 2020  GLUCAP 80   TSH:  Recent Labs  07/27/13 2254  TSH 5.006*    Assessment/Plan 1. HYPERTENSION Patient is stable; continue current regimen. Will monitor and make changes as necessary.  2. Dementia arising in the senium and presenium -does well in current environment, currently on Aricept   3. CKD (chronic kidney disease) stage 4, GFR 15-29 ml/min -will follow up bmp  4. Acute blood loss anemia -from rectal bleeding in hospital; will follow up cbc at this time -conts iron  5. GERD -currently on prilosec, no complaints  6. Unspecified constipation -Patient is stable; continue current regimen.  Will monitor and make changes as necessary.

## 2013-10-10 ENCOUNTER — Encounter: Payer: Self-pay | Admitting: Nurse Practitioner

## 2013-10-10 ENCOUNTER — Non-Acute Institutional Stay (SKILLED_NURSING_FACILITY): Payer: Medicare Other | Admitting: Nurse Practitioner

## 2013-10-10 DIAGNOSIS — K219 Gastro-esophageal reflux disease without esophagitis: Secondary | ICD-10-CM

## 2013-10-10 DIAGNOSIS — I509 Heart failure, unspecified: Secondary | ICD-10-CM

## 2013-10-10 DIAGNOSIS — K625 Hemorrhage of anus and rectum: Secondary | ICD-10-CM

## 2013-10-10 DIAGNOSIS — D62 Acute posthemorrhagic anemia: Secondary | ICD-10-CM

## 2013-10-10 DIAGNOSIS — N184 Chronic kidney disease, stage 4 (severe): Secondary | ICD-10-CM

## 2013-10-10 DIAGNOSIS — I10 Essential (primary) hypertension: Secondary | ICD-10-CM

## 2013-10-10 DIAGNOSIS — I4891 Unspecified atrial fibrillation: Secondary | ICD-10-CM

## 2013-10-10 NOTE — Progress Notes (Signed)
Patient ID: Emily Peters, female   DOB: [birth year], 80 y.o.   MRN: [id number]    Nursing Home Location:  [place] of Service: SNF (31)  PCP: Kennon Holter, MD  No Known Allergies  Chief Complaint  Patient presents with  . Medical Managment of Chronic Issues    HPI:  80 year old with pmh of GI bleed, anemia, iron deficiency, CKD stage 4, a fib, htn, GERD, CHF, hypothyroid, and dementia is being seen today for routine follow up on chronic conditions. Pt doing well in current environment and without complaints on today's visit; staff without concerns at this time.   Review of Systems:  Unable to obtain  Past Medical History  Diagnosis Date  . PAC (premature atrial contraction)   . Thyroid disease   . Hypertension   . Dementia   . Cataract   . Acid reflux   . CHF (congestive heart failure)   . Renal disorder   . Hypothyroidism   . Arthritis   . Peripheral vascular disease    Past Surgical History  Procedure Laterality Date  . Abdominal hysterectomy    . Eye surgery    . Esophageal dilation      x 2   Social History:   reports that she has never smoked. She has never used smokeless tobacco. She reports that she does not drink alcohol or use illicit drugs.  Family History  Problem Relation Age of Onset  . Heart failure    . Heart attack      Medications: Patient's Medications  New Prescriptions   No medications on file  Previous Medications   ACETAMINOPHEN (TYLENOL) 325 MG TABLET    Take 650 mg by mouth every 6 (six) hours as needed for mild pain, fever or headache.   ASPIRIN 81 MG TABLET    Take 81 mg by mouth daily.   CHOLECALCIFEROL (VITAMIN D) 1000 UNITS TABLET    Take 2,000 Units by mouth daily.    CYANOCOBALAMIN (VITAMIN B-12 IJ)    Inject 1 application as directed every 30 (thirty) days.    DOCUSATE SODIUM (COLACE) 100 MG CAPSULE    Take 1 capsule (100 mg total) by mouth 2 (two) times daily.   DONEPEZIL (ARICEPT) 10 MG TABLET     Take 10 mg by mouth at bedtime.   FERROUS SULFATE 325 (65 FE) MG TABLET    Take 1 tablet (325 mg total) by mouth daily.   FUROSEMIDE (LASIX) 40 MG TABLET    Take 1 tablet (40 mg total) by mouth daily.   LEVOTHYROXINE (SYNTHROID, LEVOTHROID) 112 MCG TABLET    Take 112 mcg by mouth daily before breakfast.   MAGNESIUM HYDROXIDE (MILK OF MAGNESIA) 400 MG/5ML SUSPENSION    Take 30 mLs by mouth daily as needed for mild constipation.   METOPROLOL (LOPRESSOR) 50 MG TABLET    Take 50 mg by mouth 2 (two) times daily.   OMEPRAZOLE (PRILOSEC) 40 MG CAPSULE    Take 40 mg by mouth daily.   POLYETHYLENE GLYCOL (MIRALAX / GLYCOLAX) PACKET    Take 17 g by mouth daily.   RANITIDINE (ZANTAC) 150 MG TABLET    Take 150 mg by mouth daily.   SKIN PROTECTANTS, MISC. (EUCERIN) CREAM    Apply 1 application topically 3 (three) times daily as needed for dry skin.   Modified Medications   No medications on file  Discontinued Medications   No medications on file  Physical Exam: Physical Exam  Constitutional: She is well-developed, well-nourished, and in no distress.  HENT:  Head: Normocephalic and atraumatic.  Right Ear: External ear normal.  Left Ear: External ear normal.  Nose: Nose normal.  Mouth/Throat: Oropharynx is clear and moist. No oropharyngeal exudate.  Eyes: Conjunctivae and EOM are normal. Pupils are equal, round, and reactive to light.  Neck: Normal range of motion. Neck supple. No thyromegaly present.  Cardiovascular: Normal rate and normal heart sounds.   Pulmonary/Chest: Effort normal and breath sounds normal. No respiratory distress.  Abdominal: Soft. Bowel sounds are normal. She exhibits no distension.  Lymphadenopathy:    She has no cervical adenopathy.  Neurological: She is alert.  Oriented to self  Skin: Skin is warm and dry.    Filed Vitals:   10/10/13 1529  BP: 145/80  Pulse: 75  Temp: 98.6 F (37 C)  Resp: 20      Labs reviewed: Basic Metabolic Panel:  Recent Labs   08/07/13 1617 08/07/13 1643 08/08/13 0740 08/09/13 0030  NA 141 144 144 143  K 4.2 4.2 4.0 4.4  CL 107 105 112 113*  CO2 25  --  22 21  GLUCOSE 110* 110* 88 82  BUN 67* 85* 69* 68*  CREATININE 2.15* 2.30* 2.18* 2.15*  CALCIUM 7.8*  --  7.4* 7.3*   Liver Function Tests:  Recent Labs  07/27/13 1130 08/07/13 1617 08/08/13 0740  AST 45* 29 17  ALT $Re'27 10 7  'DWk$ ALKPHOS 110 79 65  BILITOT 0.4 0.4 0.8  PROT 8.3 6.4 5.2*  ALBUMIN 2.4* 1.7* 1.5*    Recent Labs  07/27/13 1130  LIPASE 68*   No results found for this basename: AMMONIA,  in the last 8760 hours CBC:  Recent Labs  01/13/13 1318 07/27/13 1130  08/07/13 1617  08/09/13 0904 08/09/13 1900 08/10/13 0658  WBC 4.1 3.7*  < > 6.2  --   --  6.2 5.3  NEUTROABS 2.5 2.5  --  4.6  --   --   --   --   HGB 8.8* 9.4*  < > 8.5*  < > 9.4* 8.8* 9.3*  HCT 28.0* 29.2*  < > 27.2*  < > 29.1* 27.6* 28.8*  MCV 84.3 99.3  < > 100.4*  --   --  98.2 99.0  PLT 279 207  < > 189  --   --  153 142*  < > = values in this interval not displayed. Cardiac Enzymes:  Recent Labs  01/13/13 1318 07/27/13 1130  CKTOTAL  --  35  TROPONINI <0.30  --    BNP: No components found with this basename: POCBNP,  CBG:  Recent Labs  08/07/13 2020  GLUCAP 80   TSH:  Recent Labs  07/27/13 2254  TSH 5.006*  CBC with Diff    Result: 09/11/2013 4:47 PM   ( Status: F )       WBC 3.2   L 4.0-10.5 K/uL SLN   RBC 2.88   L 3.87-5.11 MIL/uL SLN   Hemoglobin 9.1   L 12.0-15.0 g/dL SLN   Hematocrit 27.9   L 36.0-46.0 % SLN   MCV 96.9     78.0-100.0 fL SLN   MCH 31.6     26.0-34.0 pg SLN   MCHC 32.6     30.0-36.0 g/dL SLN   RDW 17.0   H 11.5-15.5 % SLN   Platelet Count 223     150-400 K/uL SLN   Granulocyte %  53     43-77 % SLN   Absolute Gran 1.7     1.7-7.7 K/uL SLN   Lymph % 27     12-46 % SLN   Absolute Lymph 0.9     0.7-4.0 K/uL SLN   Mono % 16   H 3-12 % SLN   Absolute Mono 0.5     0.1-1.0 K/uL SLN   Eos % 3     0-5 % SLN   Absolute  Eos 0.1     0.0-0.7 K/uL SLN   Baso % 1     0-1 % SLN   Absolute Baso 0.0     0.0-0.1 K/uL SLN   Smear Review Criteria for review not met  SLN   Basic Metabolic Panel    Result: 09/11/2013 5:29 PM   ( Status: F )       Sodium 136     135-145 mEq/L SLN   Potassium 4.2     3.5-5.3 mEq/L SLN   Chloride 105     96-112 mEq/L SLN   CO2 23     19-32 mEq/L SLN   Glucose 81     70-99 mg/dL SLN   BUN 40   H 6-23 mg/dL SLN   Creatinine 1.76   H 0.50-1.10 mg/dL SLN   Calcium 7.7   L  Assessment/Plan 1. CHF, chronic (mild systolic IB70% and grade 2 DD) -stable on betablocker and lasix   2. HYPERTENSION -Patient is stable; continue current regimen. Will monitor and make changes as necessary.  3. Atrial fibrillation -not a candidate for coumadin due to GI bleed; currently on ASA   4. GERD - stable on prilosec  5. Rectal bleed -stable; no signs of bleeding, hgb stable  6. Acute blood loss anemia - due to GI bleed in hospital -hgb stable on last lab  7. CKD (chronic kidney disease) stage 4, GFR 15-29 ml/min -Cr and BUN currently stable  8. Hypothyroid -conts synthroid   9. Dementia -does well in current environment -conts on aricept

## 2013-11-17 ENCOUNTER — Non-Acute Institutional Stay (SKILLED_NURSING_FACILITY): Payer: Medicare Other | Admitting: Nurse Practitioner

## 2013-11-17 ENCOUNTER — Encounter: Payer: Self-pay | Admitting: Nurse Practitioner

## 2013-11-17 DIAGNOSIS — K219 Gastro-esophageal reflux disease without esophagitis: Secondary | ICD-10-CM

## 2013-11-17 DIAGNOSIS — I509 Heart failure, unspecified: Secondary | ICD-10-CM

## 2013-11-17 DIAGNOSIS — F0390 Unspecified dementia without behavioral disturbance: Secondary | ICD-10-CM

## 2013-11-17 DIAGNOSIS — F068 Other specified mental disorders due to known physiological condition: Secondary | ICD-10-CM

## 2013-11-17 DIAGNOSIS — N184 Chronic kidney disease, stage 4 (severe): Secondary | ICD-10-CM

## 2013-11-17 DIAGNOSIS — I4891 Unspecified atrial fibrillation: Secondary | ICD-10-CM

## 2013-11-17 DIAGNOSIS — I10 Essential (primary) hypertension: Secondary | ICD-10-CM

## 2013-11-17 DIAGNOSIS — D62 Acute posthemorrhagic anemia: Secondary | ICD-10-CM

## 2013-11-17 NOTE — Progress Notes (Signed)
Patient ID: Emily Peters, female   DOB: Jan 04, 1915, 80 y.o.   MRN: [id number]    Nursing Home Location:  [place] of Service: SNF (31)  PCP: Kennon Holter, MD  No Known Allergies  Chief Complaint  Patient presents with  . Medical Managment of Chronic Issues    HPI:  80 year old with pmh of GI bleed, anemia, iron deficiency, CKD stage 4, a fib, htn, GERD, CHF, hypothyroid, and dementia is being seen today for routine follow up on chronic conditions. Pt doing well in current environment and without complaints on today's visit; staff without concerns at this time.  Pt unable to contribute to ROS or HPI due to dementia but nursing reports she is eating well moving bowels daily, no behavioral issues. There has been no notable changes in the last month. Review of Systems:  Unable to obtain due to dementia  Past Medical History  Diagnosis Date  . PAC (premature atrial contraction)   . Thyroid disease   . Hypertension   . Dementia   . Cataract   . Acid reflux   . CHF (congestive heart failure)   . Renal disorder   . Hypothyroidism   . Arthritis   . Peripheral vascular disease    Past Surgical History  Procedure Laterality Date  . Abdominal hysterectomy    . Eye surgery    . Esophageal dilation      x 2   Social History:   reports that she has never smoked. She has never used smokeless tobacco. She reports that she does not drink alcohol or use illicit drugs.  Family History  Problem Relation Age of Onset  . Heart failure    . Heart attack      Medications: Patient's Medications  New Prescriptions   No medications on file  Previous Medications   ACETAMINOPHEN (TYLENOL) 325 MG TABLET    Take 650 mg by mouth every 6 (six) hours as needed for mild pain, fever or headache.   ASPIRIN 81 MG TABLET    Take 81 mg by mouth daily.   CHOLECALCIFEROL (VITAMIN D) 1000 UNITS TABLET    Take 2,000 Units by mouth daily.    CYANOCOBALAMIN (VITAMIN B-12 IJ)     Inject 1 application as directed every 30 (thirty) days.    DOCUSATE SODIUM (COLACE) 100 MG CAPSULE    Take 1 capsule (100 mg total) by mouth 2 (two) times daily.   DONEPEZIL (ARICEPT) 10 MG TABLET    Take 10 mg by mouth at bedtime.   FERROUS SULFATE 325 (65 FE) MG TABLET    Take 1 tablet (325 mg total) by mouth daily.   FUROSEMIDE (LASIX) 40 MG TABLET    Take 1 tablet (40 mg total) by mouth daily.   LEVOTHYROXINE (SYNTHROID, LEVOTHROID) 112 MCG TABLET    Take 112 mcg by mouth daily before breakfast.   MAGNESIUM HYDROXIDE (MILK OF MAGNESIA) 400 MG/5ML SUSPENSION    Take 30 mLs by mouth daily as needed for mild constipation.   METOPROLOL (LOPRESSOR) 50 MG TABLET    Take 50 mg by mouth 2 (two) times daily.   OMEPRAZOLE (PRILOSEC) 40 MG CAPSULE    Take 40 mg by mouth daily.   POLYETHYLENE GLYCOL (MIRALAX / GLYCOLAX) PACKET    Take 17 g by mouth daily.   RANITIDINE (ZANTAC) 150 MG TABLET    Take 150 mg by mouth daily.   SKIN PROTECTANTS, MISC. (EUCERIN) CREAM  Apply 1 application topically 3 (three) times daily as needed for dry skin.   Modified Medications   No medications on file  Discontinued Medications   No medications on file     Physical Exam:  Filed Vitals:   11/17/13 1144  BP: 130/77  Pulse: 68  Temp: 97.3 F (36.3 C)  Resp: 20  Weight: 124 lb (56.246 kg)   Physical Exam  Constitutional: She is well-developed, well-nourished, and in no distress.  HENT:  Mouth/Throat: Oropharynx is clear and moist. No oropharyngeal exudate.  Eyes: Conjunctivae and EOM are normal. Pupils are equal, round, and reactive to light.  Neck: Normal range of motion. Neck supple.  Cardiovascular: Normal rate and normal heart sounds.   Pulmonary/Chest: Effort normal and breath sounds normal. No respiratory distress.  Abdominal: Soft. Bowel sounds are normal. She exhibits no distension.  Musculoskeletal: She exhibits no edema and no tenderness.  Self propels in [facility]  Neurological: She is alert.    Oriented to self  Skin: Skin is warm and dry.      Labs reviewed: Basic Metabolic Panel:  Recent Labs  08/07/13 1617 08/07/13 1643 08/08/13 0740 08/09/13 0030  NA 141 144 144 143  K 4.2 4.2 4.0 4.4  CL 107 105 112 113*  CO2 25  --  22 21  GLUCOSE 110* 110* 88 82  BUN 67* 85* 69* 68*  CREATININE 2.15* 2.30* 2.18* 2.15*  CALCIUM 7.8*  --  7.4* 7.3*   Liver Function Tests:  Recent Labs  07/27/13 1130 08/07/13 1617 08/08/13 0740  AST 45* 29 17  ALT $Re'27 10 7  'EQx$ ALKPHOS 110 79 65  BILITOT 0.4 0.4 0.8  PROT 8.3 6.4 5.2*  ALBUMIN 2.4* 1.7* 1.5*    Recent Labs  07/27/13 1130  LIPASE 68*   No results found for this basename: AMMONIA,  in the last 8760 hours CBC:  Recent Labs  01/13/13 1318 07/27/13 1130  08/07/13 1617  08/09/13 0904 08/09/13 1900 08/10/13 0658  WBC 4.1 3.7*  < > 6.2  --   --  6.2 5.3  NEUTROABS 2.5 2.5  --  4.6  --   --   --   --   HGB 8.8* 9.4*  < > 8.5*  < > 9.4* 8.8* 9.3*  HCT 28.0* 29.2*  < > 27.2*  < > 29.1* 27.6* 28.8*  MCV 84.3 99.3  < > 100.4*  --   --  98.2 99.0  PLT 279 207  < > 189  --   --  153 142*  < > = values in this interval not displayed. Cardiac Enzymes:  Recent Labs  01/13/13 1318 07/27/13 1130  CKTOTAL  --  35  TROPONINI <0.30  --    BNP: No components found with this basename: POCBNP,  CBG:  Recent Labs  08/07/13 2020  GLUCAP 80   TSH:  Recent Labs  07/27/13 2254  TSH 5.006*  CBC with Diff  Result: 09/11/2013 4:47 PM ( Status: F )  WBC 3.2 L 4.0-10.5 K/uL SLN  RBC 2.88 L 3.87-5.11 MIL/uL SLN  Hemoglobin 9.1 L 12.0-15.0 g/dL SLN  Hematocrit 27.9 L 36.0-46.0 % SLN  MCV 96.9 78.0-100.0 fL SLN  MCH 31.6 26.0-34.0 pg SLN  MCHC 32.6 30.0-36.0 g/dL SLN  RDW 17.0 H 11.5-15.5 % SLN  Platelet Count 223 150-400 K/uL SLN  Granulocyte % 53 43-77 % SLN  Absolute Gran 1.7 1.7-7.7 K/uL SLN  Lymph % 27 12-46 % SLN  Absolute Lymph  0.9 0.7-4.0 K/uL SLN  Mono % 16 H 3-12 % SLN  Absolute Mono 0.5 0.1-1.0 K/uL  SLN  Eos % 3 0-5 % SLN  Absolute Eos 0.1 0.0-0.7 K/uL SLN  Baso % 1 0-1 % SLN  Absolute Baso 0.0 0.0-0.1 K/uL SLN  Smear Review Criteria for review not met SLN  Basic Metabolic Panel  Result: 84/16/6063 5:29 PM ( Status: F )  Sodium 136 135-145 mEq/L SLN  Potassium 4.2 3.5-5.3 mEq/L SLN  Chloride 105 96-112 mEq/L SLN  CO2 23 19-32 mEq/L SLN  Glucose 81 70-99 mg/dL SLN  BUN 40 H 6-23 mg/dL SLN  Creatinine 1.76 H 0.50-1.10 mg/dL SLN  Calcium 7.7 L  Assessment/Plan 1. CHF, chronic (mild systolic KZ60% and grade 2 DD) conts to be well controlled on lasix, beta blocker 2. HYPERTENSION Blood pressure at goal on current medications  3. GERD -conts on prilosec and and zantac  4. Atrial fibrillation Rate controlled ASA only due to GI  bleed  5. Dementia arising in the senium and presenium -stable in current environment   6. CKD (chronic kidney disease) stage 4, GFR 15-29 ml/min -reamins stable; will cont to follow labs  7. Acute blood loss anemia -current on iron, will follow up cbc next week

## 2013-12-12 ENCOUNTER — Non-Acute Institutional Stay: Payer: Medicare HMO | Admitting: Nurse Practitioner

## 2013-12-12 DIAGNOSIS — I509 Heart failure, unspecified: Secondary | ICD-10-CM

## 2013-12-12 DIAGNOSIS — K222 Esophageal obstruction: Secondary | ICD-10-CM

## 2013-12-12 DIAGNOSIS — I10 Essential (primary) hypertension: Secondary | ICD-10-CM

## 2013-12-12 DIAGNOSIS — N184 Chronic kidney disease, stage 4 (severe): Secondary | ICD-10-CM

## 2013-12-12 DIAGNOSIS — D510 Vitamin B12 deficiency anemia due to intrinsic factor deficiency: Secondary | ICD-10-CM

## 2013-12-12 DIAGNOSIS — E039 Hypothyroidism, unspecified: Secondary | ICD-10-CM

## 2013-12-12 DIAGNOSIS — I739 Peripheral vascular disease, unspecified: Secondary | ICD-10-CM

## 2013-12-12 DIAGNOSIS — F0390 Unspecified dementia without behavioral disturbance: Secondary | ICD-10-CM

## 2013-12-12 DIAGNOSIS — K219 Gastro-esophageal reflux disease without esophagitis: Secondary | ICD-10-CM

## 2013-12-12 DIAGNOSIS — F068 Other specified mental disorders due to known physiological condition: Secondary | ICD-10-CM

## 2013-12-12 NOTE — Progress Notes (Signed)
Patient ID: Emily Peters, female   DOB: 1915-05-19, 80 y.o.   MRN: [id number]   Nursing Home Location:  [place] of Service: SNF (31)  PCP: Kennon Holter, MD  No Known Allergies  Chief Complaint  Patient presents with  . Medical Managment of Chronic Issues    HPI:  Patient is seen today for routine visit and medical maintenance of routine health issues for multiple complex issues including Atrial fibrillation, CHF, HTN, PVD, GERD, Esophageal Stricture, Hypothyroidism, Dementia, Osteoporosis, CKD, and anemia. She is a poor historian, but has no current complaints and the nursing staff has no new concerns or reports for this visit. She is currently a DNR. Family visited today as is their routine daily schedule.   Review of Systems:  Review of Systems  Constitutional: Negative for fever, chills and weight loss.  HENT: Hearing loss: chronic.   Respiratory: Negative for cough, sputum production, shortness of breath and wheezing.   Cardiovascular: Negative for chest pain, palpitations and leg swelling.  Gastrointestinal: Negative for diarrhea, constipation, blood in stool and melena. Abdominal pain: "not bad"  Genitourinary:       Incontinent; wears adult briefs   Musculoskeletal: Negative for falls, myalgias and neck pain.  Skin: Negative for rash.  Neurological: Negative for dizziness, loss of consciousness and headaches. Weakness: generalized, at baseline.  Endo/Heme/Allergies: Does not bruise/bleed easily.  Psychiatric/Behavioral: Negative for depression and hallucinations. Memory loss: chronic. The patient is not nervous/anxious and does not have insomnia.      Past Medical History  Diagnosis Date  . PAC (premature atrial contraction)   . Thyroid disease   . Hypertension   . Dementia   . Cataract   . Acid reflux   . CHF (congestive heart failure)   . Renal disorder   . Hypothyroidism   . Arthritis   . Peripheral vascular disease    Past  Surgical History  Procedure Laterality Date  . Abdominal hysterectomy    . Eye surgery    . Esophageal dilation      x 2   Social History:   reports that she has never smoked. She has never used smokeless tobacco. She reports that she does not drink alcohol or use illicit drugs.  Family History  Problem Relation Age of Onset  . Heart failure    . Heart attack      Medications: Patient's Medications  New Prescriptions   No medications on file  Previous Medications   ACETAMINOPHEN (TYLENOL) 325 MG TABLET    Take 650 mg by mouth every 6 (six) hours as needed for mild pain, fever or headache.   ASPIRIN 81 MG TABLET    Take 81 mg by mouth daily.   CHOLECALCIFEROL (VITAMIN D) 1000 UNITS TABLET    Take 2,000 Units by mouth daily.    CYANOCOBALAMIN (VITAMIN B-12 IJ)    Inject 1 application as directed every 30 (thirty) days.    DOCUSATE SODIUM (COLACE) 100 MG CAPSULE    Take 1 capsule (100 mg total) by mouth 2 (two) times daily.   DONEPEZIL (ARICEPT) 10 MG TABLET    Take 10 mg by mouth at bedtime.   FERROUS SULFATE 325 (65 FE) MG TABLET    Take 1 tablet (325 mg total) by mouth daily.   FUROSEMIDE (LASIX) 40 MG TABLET    Take 1 tablet (40 mg total) by mouth daily.   LEVOTHYROXINE (SYNTHROID, LEVOTHROID) 112 MCG TABLET    Take 112  mcg by mouth daily before breakfast.   MAGNESIUM HYDROXIDE (MILK OF MAGNESIA) 400 MG/5ML SUSPENSION    Take 30 mLs by mouth daily as needed for mild constipation.   METOPROLOL (LOPRESSOR) 50 MG TABLET    Take 50 mg by mouth 2 (two) times daily.   OMEPRAZOLE (PRILOSEC) 40 MG CAPSULE    Take 40 mg by mouth daily.   POLYETHYLENE GLYCOL (MIRALAX / GLYCOLAX) PACKET    Take 17 g by mouth daily.   RANITIDINE (ZANTAC) 150 MG TABLET    Take 150 mg by mouth daily.   SKIN PROTECTANTS, MISC. (EUCERIN) CREAM    Apply 1 application topically 3 (three) times daily as needed for dry skin.   Modified Medications   No medications on file  Discontinued Medications   No  medications on file    Physical Exam: Filed Vitals:   12/12/13 1251  BP: 117/77  Pulse: 74  Temp: 98.2 F (36.8 C)  Resp: 20  Weight: 126 lb (57.153 kg)   Physical Exam  Nursing note and vitals reviewed. Constitutional: No distress.  HENT:  Head: Normocephalic and atraumatic.  Eyes: Pupils are equal, round, and reactive to light. Right eye exhibits no discharge. Left eye exhibits no discharge.  Neck: Normal range of motion. Neck supple.  Cardiovascular: Normal rate and intact distal pulses.   Pulmonary/Chest: Effort normal and breath sounds normal. No respiratory distress. She has no wheezes. She exhibits no tenderness.  Abdominal: Soft. Bowel sounds are normal. She exhibits no distension and no mass. There is no tenderness. There is no rebound and no guarding.  Musculoskeletal: She exhibits no edema and no tenderness.  Neurological: She is alert.  Skin: Skin is warm and dry. No rash noted. She is not diaphoretic. There is pallor.  Psychiatric: Mood and affect normal.    Labs reviewed:  Basic Metabolic Panel:  Recent Labs  08/07/13 1617 08/07/13 1643 08/08/13 0740 08/09/13 0030  NA 141 144 144 143  K 4.2 4.2 4.0 4.4  CL 107 105 112 113*  CO2 25  --  22 21  GLUCOSE 110* 110* 88 82  BUN 67* 85* 69* 68*  CREATININE 2.15* 2.30* 2.18* 2.15*  CALCIUM 7.8*  --  7.4* 7.3*   Liver Function Tests:  Recent Labs  07/27/13 1130 08/07/13 1617 08/08/13 0740  AST 45* 29 17  ALT $Re'27 10 7  'Zwa$ ALKPHOS 110 79 65  BILITOT 0.4 0.4 0.8  PROT 8.3 6.4 5.2*  ALBUMIN 2.4* 1.7* 1.5*    Recent Labs  07/27/13 1130  LIPASE 68*   No results found for this basename: AMMONIA,  in the last 8760 hours CBC:  Recent Labs  01/13/13 1318 07/27/13 1130  08/07/13 1617  08/09/13 0904 08/09/13 1900 08/10/13 0658  WBC 4.1 3.7*  < > 6.2  --   --  6.2 5.3  NEUTROABS 2.5 2.5  --  4.6  --   --   --   --   HGB 8.8* 9.4*  < > 8.5*  < > 9.4* 8.8* 9.3*  HCT 28.0* 29.2*  < > 27.2*  < > 29.1*  27.6* 28.8*  MCV 84.3 99.3  < > 100.4*  --   --  98.2 99.0  PLT 279 207  < > 189  --   --  153 142*  < > = values in this interval not displayed. Cardiac Enzymes:  Recent Labs  01/13/13 1318 07/27/13 1130  CKTOTAL  --  35  TROPONINI <0.30  --  BNP: No components found with this basename: POCBNP,  CBG:  Recent Labs  08/07/13 2020  GLUCAP 80   TSH:  TSH, Ultrasensitive    Result: 11/23/2013 7:52 PM   ( Status: F )       TSH 6.045   H 0.350-4.500 uIU/mL SLN      Recent Labs  07/27/13 2254  TSH 5.006*   CBC with Diff    Result: 12/12/2013 11:28 AM   ( Status: P )     C WBC 3.2   L 4.0-10.5 K/uL SLN   RBC 3.57   L 3.87-5.11 MIL/uL SLN   Hemoglobin 11.3   L 12.0-15.0 g/dL SLN   Hematocrit 34.2   L 36.0-46.0 % SLN   MCV 95.8     78.0-100.0 fL SLN   MCH 31.7     26.0-34.0 pg SLN   MCHC 33.0     30.0-36.0 g/dL SLN   RDW 15.1     11.5-15.5 % SLN   Platelet Count 222     150-400 K/uL SLN   Granulocyte % 55     43-77 % SLN   Absolute Gran 1.8     1.7-7.7 K/uL SLN   Lymph % 30     12-46 % SLN   Absolute Lymph 1.0     0.7-4.0 K/uL SLN   Mono % 13   H 3-12 % SLN   Absolute Mono 0.4     0.1-1.0 K/uL SLN   Eos % 2     0-5 % SLN   Absolute Eos 0.1     0.0-0.7 K/uL SLN   Baso % 0     0-1 % SLN   Absolute Baso 0.0     0.0-0.1 K/uL SLN   Smear Review Criteria for review not met  SLN   Basic Metabolic Panel    Result: 12/12/2013 2:39 PM   ( Status: F )       Sodium 139     135-145 mEq/L SLN   Potassium 4.4     3.5-5.3 mEq/L SLN   Chloride 106     96-112 mEq/L SLN   CO2 28     19-32 mEq/L SLN   Glucose 86     70-99 mg/dL SLN   BUN 43   H 6-23 mg/dL SLN   Creatinine 2.02   H 0.50-1.10 mg/dL SLN   Calcium 8.7      Assessment/Plan 1. HYPERTENSION Currently controlled. Will continue dose of metoprolol and continue to monitor.  2. CHF, chronic (mild systolic GE95% and grade 2 DD) Stable at this time. No signs of fluid volume overload or significant retention. Will continue  Lasix $Remo'40mg'vkWJZ$  daily.   3. Peripheral vascular disease Stable, no new complications related to this. Continue asa $RemoveBefo'81mg'BXMTgaLPFrl$  daily.   4. ESOPHAGEAL STRICTURE No current issues or pain related to this. Will continue to monitor for changes and intervene as needed.  5. GERD Stable, no current issues or GI symptoms. Will continue Prilosec at $RemoveBef'40mg'xntflkXfKp$  for now.   6. HYPOTHYROIDISM Last TSH 6. Will continue Synthroid 112 mcg daily and follow up TSH in 1 month  7. Dementia arising in the senium and presenium Behavior and mood has been controlled and at baseline. No significant decline since last exam. Will continue Aricept at current dosage and monitor need for additional agents.   8. ANEMIA Stable and asymptomatic at this time. Hgb. Improved since last visit. Will continue to monitor for bleeding and continue 65 FE  daily. Patient is tolerating medication without side effect.

## 2013-12-27 ENCOUNTER — Encounter (HOSPITAL_COMMUNITY): Payer: Self-pay | Admitting: Emergency Medicine

## 2013-12-27 ENCOUNTER — Emergency Department (HOSPITAL_COMMUNITY): Payer: Medicare HMO

## 2013-12-27 ENCOUNTER — Inpatient Hospital Stay (HOSPITAL_COMMUNITY)
Admission: EM | Admit: 2013-12-27 | Discharge: 2014-01-01 | DRG: 177 | Disposition: A | Discharge status: Skilled Nursing Facility | Payer: Medicare HMO | Attending: Internal Medicine | Admitting: Internal Medicine

## 2013-12-27 DIAGNOSIS — K625 Hemorrhage of anus and rectum: Secondary | ICD-10-CM

## 2013-12-27 DIAGNOSIS — R32 Unspecified urinary incontinence: Secondary | ICD-10-CM

## 2013-12-27 DIAGNOSIS — K219 Gastro-esophageal reflux disease without esophagitis: Secondary | ICD-10-CM

## 2013-12-27 DIAGNOSIS — Z7982 Long term (current) use of aspirin: Secondary | ICD-10-CM

## 2013-12-27 DIAGNOSIS — D62 Acute posthemorrhagic anemia: Secondary | ICD-10-CM

## 2013-12-27 DIAGNOSIS — R269 Unspecified abnormalities of gait and mobility: Secondary | ICD-10-CM

## 2013-12-27 DIAGNOSIS — Z66 Do not resuscitate: Secondary | ICD-10-CM | POA: Diagnosis present

## 2013-12-27 DIAGNOSIS — A419 Sepsis, unspecified organism: Secondary | ICD-10-CM

## 2013-12-27 DIAGNOSIS — D509 Iron deficiency anemia, unspecified: Secondary | ICD-10-CM | POA: Diagnosis present

## 2013-12-27 DIAGNOSIS — N390 Urinary tract infection, site not specified: Secondary | ICD-10-CM

## 2013-12-27 DIAGNOSIS — M48061 Spinal stenosis, lumbar region without neurogenic claudication: Secondary | ICD-10-CM

## 2013-12-27 DIAGNOSIS — D510 Vitamin B12 deficiency anemia due to intrinsic factor deficiency: Secondary | ICD-10-CM

## 2013-12-27 DIAGNOSIS — G9340 Encephalopathy, unspecified: Secondary | ICD-10-CM

## 2013-12-27 DIAGNOSIS — N184 Chronic kidney disease, stage 4 (severe): Secondary | ICD-10-CM

## 2013-12-27 DIAGNOSIS — F0390 Unspecified dementia without behavioral disturbance: Secondary | ICD-10-CM

## 2013-12-27 DIAGNOSIS — I4891 Unspecified atrial fibrillation: Secondary | ICD-10-CM

## 2013-12-27 DIAGNOSIS — R079 Chest pain, unspecified: Secondary | ICD-10-CM

## 2013-12-27 DIAGNOSIS — Z79899 Other long term (current) drug therapy: Secondary | ICD-10-CM

## 2013-12-27 DIAGNOSIS — J690 Pneumonitis due to inhalation of food and vomit: Principal | ICD-10-CM | POA: Diagnosis present

## 2013-12-27 DIAGNOSIS — R6520 Severe sepsis without septic shock: Secondary | ICD-10-CM

## 2013-12-27 DIAGNOSIS — I129 Hypertensive chronic kidney disease with stage 1 through stage 4 chronic kidney disease, or unspecified chronic kidney disease: Secondary | ICD-10-CM | POA: Diagnosis present

## 2013-12-27 DIAGNOSIS — E039 Hypothyroidism, unspecified: Secondary | ICD-10-CM

## 2013-12-27 DIAGNOSIS — IMO0002 Reserved for concepts with insufficient information to code with codable children: Secondary | ICD-10-CM

## 2013-12-27 DIAGNOSIS — E538 Deficiency of other specified B group vitamins: Secondary | ICD-10-CM

## 2013-12-27 DIAGNOSIS — I5022 Chronic systolic (congestive) heart failure: Secondary | ICD-10-CM | POA: Diagnosis present

## 2013-12-27 DIAGNOSIS — I509 Heart failure, unspecified: Secondary | ICD-10-CM

## 2013-12-27 DIAGNOSIS — K222 Esophageal obstruction: Secondary | ICD-10-CM

## 2013-12-27 DIAGNOSIS — I739 Peripheral vascular disease, unspecified: Secondary | ICD-10-CM

## 2013-12-27 DIAGNOSIS — M25551 Pain in right hip: Secondary | ICD-10-CM

## 2013-12-27 DIAGNOSIS — J189 Pneumonia, unspecified organism: Secondary | ICD-10-CM | POA: Diagnosis present

## 2013-12-27 DIAGNOSIS — M810 Age-related osteoporosis without current pathological fracture: Secondary | ICD-10-CM

## 2013-12-27 DIAGNOSIS — R1319 Other dysphagia: Secondary | ICD-10-CM

## 2013-12-27 DIAGNOSIS — I10 Essential (primary) hypertension: Secondary | ICD-10-CM

## 2013-12-27 DIAGNOSIS — A498 Other bacterial infections of unspecified site: Secondary | ICD-10-CM | POA: Diagnosis present

## 2013-12-27 LAB — I-STAT VENOUS BLOOD GAS, ED
Bicarbonate: 26.3 mEq/L — ABNORMAL HIGH (ref 20.0–24.0)
O2 Saturation: 63 %
PCO2 VEN: 46.8 mmHg (ref 45.0–50.0)
PH VEN: 7.358 — AB (ref 7.250–7.300)
PO2 VEN: 34 mmHg (ref 30.0–45.0)
TCO2: 28 mmol/L (ref 0–100)

## 2013-12-27 LAB — COMPREHENSIVE METABOLIC PANEL
ALT: 5 U/L (ref 0–35)
AST: 25 U/L (ref 0–37)
Albumin: 2.6 g/dL — ABNORMAL LOW (ref 3.5–5.2)
Alkaline Phosphatase: 54 U/L (ref 39–117)
BILIRUBIN TOTAL: 0.3 mg/dL (ref 0.3–1.2)
BUN: 58 mg/dL — ABNORMAL HIGH (ref 6–23)
CO2: 23 meq/L (ref 19–32)
CREATININE: 2.3 mg/dL — AB (ref 0.50–1.10)
Calcium: 8.9 mg/dL (ref 8.4–10.5)
Chloride: 102 mEq/L (ref 96–112)
GFR calc Af Amer: 19 mL/min — ABNORMAL LOW (ref >90)
GFR, EST NON AFRICAN AMERICAN: 17 mL/min — AB (ref >90)
GLUCOSE: 131 mg/dL — AB (ref 70–99)
Potassium: 4.1 mEq/L (ref 3.7–5.3)
SODIUM: 140 meq/L (ref 137–147)
Total Protein: 7.9 g/dL (ref 6.0–8.3)

## 2013-12-27 LAB — CBC
HCT: 33.9 % — ABNORMAL LOW (ref 36.0–46.0)
HEMOGLOBIN: 11 g/dL — AB (ref 12.0–15.0)
MCH: 32.7 pg (ref 26.0–34.0)
MCHC: 32.4 g/dL (ref 30.0–36.0)
MCV: 100.9 fL — AB (ref 78.0–100.0)
Platelets: 180 10*3/uL (ref 150–400)
RBC: 3.36 MIL/uL — AB (ref 3.87–5.11)
RDW: 14.5 % (ref 11.5–15.5)
WBC: 5.4 10*3/uL (ref 4.0–10.5)

## 2013-12-27 LAB — I-STAT CG4 LACTIC ACID, ED: Lactic Acid, Venous: 1.09 mmol/L (ref 0.5–2.2)

## 2013-12-27 MED ORDER — ACETAMINOPHEN 325 MG PO TABS
650.0000 mg | ORAL_TABLET | Freq: Once | ORAL | Status: AC
  Administered 2013-12-27: 650 mg via ORAL
  Filled 2013-12-27: qty 2

## 2013-12-27 MED ORDER — SODIUM CHLORIDE 0.9 % IV BOLUS (SEPSIS)
1000.0000 mL | Freq: Once | INTRAVENOUS | Status: AC
  Administered 2013-12-27: 1000 mL via INTRAVENOUS

## 2013-12-27 MED ORDER — SODIUM CHLORIDE 0.9 % IV SOLN
Freq: Once | INTRAVENOUS | Status: AC
  Administered 2013-12-28: 01:00:00 via INTRAVENOUS

## 2013-12-27 NOTE — ED Notes (Signed)
Patient transported to X-ray 

## 2013-12-27 NOTE — ED Notes (Addendum)
Per ems-- pt from [place] [facility] facility. Staff reports that son came today and fed her strawberries. Later, they found vomit on her gown that looked like strawberries. Pt o2 stats decreased 88% on RA. Pt presents with cough, lung sounds congested. Pt a&o per norm. Skin warm and dry. Pt also had fever of 103 at facility and they administered 650 tylenol suppository pta.

## 2013-12-27 NOTE — ED Provider Notes (Signed)
CSN: [id number]     Arrival date & time 12/27/13  2237 History   First MD Initiated Contact with Patient 12/27/13 2309     Chief Complaint  Patient presents with  . Aspiration     (Consider location/radiation/quality/duration/timing/severity/associated sxs/prior Treatment) HPI Comments: Pt comes in with cc of confusion, hypoxia. Hx of CHF, HTN, Dementia. Pt is quite alert at baseline per family, in fact they went to see her this evening and fed her strawberries. Apparently few minutes after, pt started coughing, and became hypoxic. Per family, pt has been coughing the last few days. Currently, pt is somnolent, compared to baseline.  The history is provided by the patient.    Past Medical History  Diagnosis Date  . PAC (premature atrial contraction)   . Thyroid disease   . Hypertension   . Dementia   . Cataract   . Acid reflux   . CHF (congestive heart failure)   . Renal disorder   . Hypothyroidism   . Arthritis   . Peripheral vascular disease    Past Surgical History  Procedure Laterality Date  . Abdominal hysterectomy    . Eye surgery    . Esophageal dilation      x 2   Family History  Problem Relation Age of Onset  . Heart failure    . Heart attack     History  Substance Use Topics  . Smoking status: Never Smoker   . Smokeless tobacco: Never Used  . Alcohol Use: No   OB History   Grav Para Term Preterm Abortions TAB SAB Ect Mult Living                 Review of Systems  Constitutional: Positive for fever, activity change and fatigue. Negative for chills.  Respiratory: Positive for cough and shortness of breath.   Cardiovascular: Negative for chest pain.  Gastrointestinal: Negative for nausea, vomiting and abdominal pain.  Genitourinary: Negative for dysuria.  Musculoskeletal: Negative for neck pain.  Neurological: Negative for headaches.  All other systems reviewed and are negative.     Allergies  Review of patient's allergies indicates no known  allergies.  Home Medications   Current Outpatient Rx  Name  Route  Sig  Dispense  Refill  . aspirin 81 MG tablet   Oral   Take 81 mg by mouth daily.         . cholecalciferol (VITAMIN D) 1000 UNITS tablet   Oral   Take 2,000 Units by mouth daily.          . Cyanocobalamin (VITAMIN B-12 IJ)   Injection   Inject 1 application as directed every 30 (thirty) days.          Marland Kitchen. docusate sodium (COLACE) 100 MG capsule   Oral   Take 1 capsule (100 mg total) by mouth 2 (two) times daily.   10 capsule   0   . donepezil (ARICEPT) 10 MG tablet   Oral   Take 10 mg by mouth at bedtime.         . ferrous sulfate 325 (65 FE) MG tablet   Oral   Take 1 tablet (325 mg total) by mouth daily.   30 tablet   1   . furosemide (LASIX) 40 MG tablet   Oral   Take 1 tablet (40 mg total) by mouth daily.   30 tablet      . levothyroxine (SYNTHROID, LEVOTHROID) 112 MCG tablet   Oral   Take  112 mcg by mouth daily before breakfast.         . metoprolol (LOPRESSOR) 50 MG tablet   Oral   Take 50 mg by mouth 2 (two) times daily.         Marland Kitchen omeprazole (PRILOSEC) 20 MG capsule   Oral   Take 40 mg by mouth daily.         . polyethylene glycol (MIRALAX / GLYCOLAX) packet   Oral   Take 17 g by mouth daily.   14 each   0   . ranitidine (ZANTAC) 150 MG tablet   Oral   Take 150 mg by mouth daily.         Marland Kitchen acetaminophen (TYLENOL) 325 MG tablet   Oral   Take 650 mg by mouth every 6 (six) hours as needed for mild pain, fever or headache.         . magnesium hydroxide (MILK OF MAGNESIA) 400 MG/5ML suspension   Oral   Take 30 mLs by mouth daily as needed for mild constipation.         . Skin Protectants, Misc. (EUCERIN) cream   Topical   Apply 1 application topically 3 (three) times daily as needed for dry skin.           BP 112/50  Pulse 89  Temp(Src) 101 F (38.3 C) (Oral)  Resp 14  Ht 5\' 2"  (1.575 m)  Wt 126 lb (57.153 kg)  BMI 23.04 kg/m2  SpO2 99% Physical  Exam  Nursing note reviewed. Constitutional: She is oriented to person, place, and time. She appears well-developed and well-nourished.  HENT:  Head: Normocephalic and atraumatic.  Eyes: EOM are normal. Pupils are equal, round, and reactive to light.  Neck: Neck supple.  Cardiovascular: Normal rate, regular rhythm and normal heart sounds.   No murmur heard. Pulmonary/Chest: Effort normal. No respiratory distress.  Pt has left sided rhonchi  Abdominal: Soft. She exhibits no distension. There is no tenderness. There is no rebound and no guarding.  Neurological: She is alert and oriented to person, place, and time.  Skin: Skin is warm and dry.    ED Course  Procedures (including critical care time) Labs Review Labs Reviewed  CBC - Abnormal; Notable for the following:    RBC 3.36 (*)    Hemoglobin 11.0 (*)    HCT 33.9 (*)    MCV 100.9 (*)    All other components within normal limits  COMPREHENSIVE METABOLIC PANEL - Abnormal; Notable for the following:    Glucose, Bld 131 (*)    BUN 58 (*)    Creatinine, Ser 2.30 (*)    Albumin 2.6 (*)    GFR calc non Af Amer 17 (*)    GFR calc Af Amer 19 (*)    All other components within normal limits  URINALYSIS, ROUTINE W REFLEX MICROSCOPIC - Abnormal; Notable for the following:    APPearance TURBID (*)    Hgb urine dipstick LARGE (*)    Protein, ur 100 (*)    Nitrite POSITIVE (*)    Leukocytes, UA LARGE (*)    All other components within normal limits  URINE MICROSCOPIC-ADD ON - Abnormal; Notable for the following:    Squamous Epithelial / LPF FEW (*)    Bacteria, UA MANY (*)    Casts HYALINE CASTS (*)    All other components within normal limits  I-STAT VENOUS BLOOD GAS, ED - Abnormal; Notable for the following:    pH, Ven  7.358 (*)    Bicarbonate 26.3 (*)    All other components within normal limits  URINE CULTURE  CULTURE, BLOOD (ROUTINE X 2)  CULTURE, BLOOD (ROUTINE X 2)  TROPONIN I  I-STAT CG4 LACTIC ACID, ED   Imaging  Review Dg Chest 2 View  12/28/2013   CLINICAL DATA:  Aspiration, shortness of breath.  EXAM: CHEST  2 VIEW  COMPARISON:  07/28/2013 and 04/14/2009  FINDINGS: Lungs are adequately inflated with minimal opacification over the posterior left base as cannot exclude atelectasis or infection. Minimal blunting of the right posterior costophrenic angle as cannot exclude a small amount of pleural fluid. There is mild stable cardiomegaly. There is moderate degenerative change of the spine. Known compression fracture in the region of the thoracolumbar junction. Old right rib fractures.  IMPRESSION: Mild opacification over the posterior left base which may be due to atelectasis or infection. Possible small amount of posterior right pleural fluid.  Known compression fracture over the region of the thoracolumbar junction.   Electronically Signed   By: Elberta Fortis M.D.   On: 12/28/2013 00:04     EKG Interpretation None      MDM   Final diagnoses:  HCAP (healthcare-associated pneumonia)  UTI (lower urinary tract infection)  Severe sepsis    DDx: Sepsis syndrome ACS syndrome DKA ICH Stroke CHF exacerbation COPD exacerbation Infection - pneumonia/UTI/Cellulitis PE Dehydration Electrolyte abnormality Tox syndrome  Pt comes in with some increased confusion, and hypoxia. Pt has been having fever and cough since yday, but she got worse this evening. Stable right now, 89% on room air, poor lung sounds on the left side, and a UA is showing infection.  Will give vanc and cef, to cover for hcap and UTI. Severe sepsis, lactate is normal.     Derwood Kaplan, MD 12/28/13 [phone]

## 2013-12-28 ENCOUNTER — Encounter (HOSPITAL_COMMUNITY): Payer: Self-pay | Admitting: Internal Medicine

## 2013-12-28 DIAGNOSIS — I129 Hypertensive chronic kidney disease with stage 1 through stage 4 chronic kidney disease, or unspecified chronic kidney disease: Secondary | ICD-10-CM | POA: Diagnosis present

## 2013-12-28 DIAGNOSIS — E039 Hypothyroidism, unspecified: Secondary | ICD-10-CM | POA: Diagnosis present

## 2013-12-28 DIAGNOSIS — I509 Heart failure, unspecified: Secondary | ICD-10-CM

## 2013-12-28 DIAGNOSIS — Z79899 Other long term (current) drug therapy: Secondary | ICD-10-CM | POA: Diagnosis not present

## 2013-12-28 DIAGNOSIS — N184 Chronic kidney disease, stage 4 (severe): Secondary | ICD-10-CM | POA: Diagnosis present

## 2013-12-28 DIAGNOSIS — G9340 Encephalopathy, unspecified: Secondary | ICD-10-CM | POA: Diagnosis present

## 2013-12-28 DIAGNOSIS — I10 Essential (primary) hypertension: Secondary | ICD-10-CM

## 2013-12-28 DIAGNOSIS — F29 Unspecified psychosis not due to a substance or known physiological condition: Secondary | ICD-10-CM | POA: Diagnosis present

## 2013-12-28 DIAGNOSIS — I4891 Unspecified atrial fibrillation: Secondary | ICD-10-CM

## 2013-12-28 DIAGNOSIS — A498 Other bacterial infections of unspecified site: Secondary | ICD-10-CM | POA: Diagnosis present

## 2013-12-28 DIAGNOSIS — K219 Gastro-esophageal reflux disease without esophagitis: Secondary | ICD-10-CM | POA: Diagnosis present

## 2013-12-28 DIAGNOSIS — D509 Iron deficiency anemia, unspecified: Secondary | ICD-10-CM | POA: Diagnosis present

## 2013-12-28 DIAGNOSIS — Z7982 Long term (current) use of aspirin: Secondary | ICD-10-CM | POA: Diagnosis not present

## 2013-12-28 DIAGNOSIS — E538 Deficiency of other specified B group vitamins: Secondary | ICD-10-CM | POA: Diagnosis present

## 2013-12-28 DIAGNOSIS — I5022 Chronic systolic (congestive) heart failure: Secondary | ICD-10-CM | POA: Diagnosis present

## 2013-12-28 DIAGNOSIS — N390 Urinary tract infection, site not specified: Secondary | ICD-10-CM | POA: Diagnosis present

## 2013-12-28 DIAGNOSIS — Z66 Do not resuscitate: Secondary | ICD-10-CM | POA: Diagnosis present

## 2013-12-28 DIAGNOSIS — J189 Pneumonia, unspecified organism: Secondary | ICD-10-CM

## 2013-12-28 DIAGNOSIS — F068 Other specified mental disorders due to known physiological condition: Secondary | ICD-10-CM

## 2013-12-28 DIAGNOSIS — I739 Peripheral vascular disease, unspecified: Secondary | ICD-10-CM | POA: Diagnosis present

## 2013-12-28 DIAGNOSIS — F0390 Unspecified dementia without behavioral disturbance: Secondary | ICD-10-CM | POA: Diagnosis present

## 2013-12-28 DIAGNOSIS — J690 Pneumonitis due to inhalation of food and vomit: Secondary | ICD-10-CM | POA: Diagnosis present

## 2013-12-28 LAB — CBC WITH DIFFERENTIAL/PLATELET
Basophils Absolute: 0 10*3/uL (ref 0.0–0.1)
Basophils Relative: 0 % (ref 0–1)
Eosinophils Absolute: 0 10*3/uL (ref 0.0–0.7)
Eosinophils Relative: 0 % (ref 0–5)
HEMATOCRIT: 31.3 % — AB (ref 36.0–46.0)
HEMOGLOBIN: 10 g/dL — AB (ref 12.0–15.0)
LYMPHS PCT: 21 % (ref 12–46)
Lymphs Abs: 1 10*3/uL (ref 0.7–4.0)
MCH: 32.2 pg (ref 26.0–34.0)
MCHC: 31.9 g/dL (ref 30.0–36.0)
MCV: 100.6 fL — ABNORMAL HIGH (ref 78.0–100.0)
MONOS PCT: 16 % — AB (ref 3–12)
Monocytes Absolute: 0.8 10*3/uL (ref 0.1–1.0)
NEUTROS ABS: 2.9 10*3/uL (ref 1.7–7.7)
Neutrophils Relative %: 63 % (ref 43–77)
Platelets: 174 10*3/uL (ref 150–400)
RBC: 3.11 MIL/uL — AB (ref 3.87–5.11)
RDW: 14.7 % (ref 11.5–15.5)
WBC: 4.6 10*3/uL (ref 4.0–10.5)

## 2013-12-28 LAB — URINE MICROSCOPIC-ADD ON

## 2013-12-28 LAB — COMPREHENSIVE METABOLIC PANEL
ALT: 5 U/L (ref 0–35)
AST: 16 U/L (ref 0–37)
Albumin: 2.2 g/dL — ABNORMAL LOW (ref 3.5–5.2)
Alkaline Phosphatase: 46 U/L (ref 39–117)
BUN: 57 mg/dL — ABNORMAL HIGH (ref 6–23)
CALCIUM: 8.1 mg/dL — AB (ref 8.4–10.5)
CO2: 23 mEq/L (ref 19–32)
Chloride: 107 mEq/L (ref 96–112)
Creatinine, Ser: 2.35 mg/dL — ABNORMAL HIGH (ref 0.50–1.10)
GFR calc non Af Amer: 16 mL/min — ABNORMAL LOW (ref >90)
GFR, EST AFRICAN AMERICAN: 19 mL/min — AB (ref >90)
GLUCOSE: 107 mg/dL — AB (ref 70–99)
Potassium: 3.8 mEq/L (ref 3.7–5.3)
Sodium: 143 mEq/L (ref 137–147)
TOTAL PROTEIN: 6.8 g/dL (ref 6.0–8.3)
Total Bilirubin: 0.2 mg/dL — ABNORMAL LOW (ref 0.3–1.2)

## 2013-12-28 LAB — TROPONIN I: Troponin I: <0.3 ng/mL (ref <0.30)

## 2013-12-28 LAB — INFLUENZA PANEL BY PCR (TYPE A & B)
H1N1FLUPCR: NOT DETECTED
Influenza A By PCR: NEGATIVE
Influenza B By PCR: NEGATIVE

## 2013-12-28 LAB — URINALYSIS, ROUTINE W REFLEX MICROSCOPIC
BILIRUBIN URINE: NEGATIVE
Glucose, UA: NEGATIVE mg/dL
Ketones, ur: NEGATIVE mg/dL
Nitrite: POSITIVE — AB
PROTEIN: 100 mg/dL — AB
Specific Gravity, Urine: 1.02 (ref 1.005–1.030)
UROBILINOGEN UA: 0.2 mg/dL (ref 0.0–1.0)
pH: 5.5 (ref 5.0–8.0)

## 2013-12-28 MED ORDER — FERROUS SULFATE 325 (65 FE) MG PO TABS
325.0000 mg | ORAL_TABLET | Freq: Every day | ORAL | Status: DC
  Administered 2013-12-28 – 2013-12-31 (×4): 325 mg via ORAL
  Filled 2013-12-28 (×4): qty 1

## 2013-12-28 MED ORDER — SODIUM CHLORIDE 0.9 % IV SOLN
INTRAVENOUS | Status: DC
  Administered 2013-12-29: 50 mL/h via INTRAVENOUS
  Administered 2013-12-30: 09:00:00 via INTRAVENOUS

## 2013-12-28 MED ORDER — VANCOMYCIN HCL IN DEXTROSE 1-5 GM/200ML-% IV SOLN
1000.0000 mg | Freq: Once | INTRAVENOUS | Status: AC
  Administered 2013-12-28: 1000 mg via INTRAVENOUS
  Filled 2013-12-28: qty 200

## 2013-12-28 MED ORDER — LEVOTHYROXINE SODIUM 112 MCG PO TABS
112.0000 ug | ORAL_TABLET | Freq: Every day | ORAL | Status: DC
  Administered 2013-12-29 – 2014-01-01 (×4): 112 ug via ORAL
  Filled 2013-12-28 (×7): qty 1

## 2013-12-28 MED ORDER — FAMOTIDINE 20 MG PO TABS
20.0000 mg | ORAL_TABLET | Freq: Every day | ORAL | Status: DC
  Administered 2013-12-29 – 2014-01-01 (×4): 20 mg via ORAL
  Filled 2013-12-28 (×5): qty 1

## 2013-12-28 MED ORDER — DOCUSATE SODIUM 100 MG PO CAPS
100.0000 mg | ORAL_CAPSULE | Freq: Two times a day (BID) | ORAL | Status: DC
  Administered 2013-12-29 – 2013-12-30 (×4): 100 mg via ORAL
  Filled 2013-12-28 (×9): qty 1

## 2013-12-28 MED ORDER — METOPROLOL TARTRATE 50 MG PO TABS
50.0000 mg | ORAL_TABLET | Freq: Two times a day (BID) | ORAL | Status: DC
  Administered 2013-12-28: 50 mg via ORAL
  Filled 2013-12-28: qty 2
  Filled 2013-12-28: qty 1

## 2013-12-28 MED ORDER — ACETAMINOPHEN 650 MG RE SUPP: 650.0000 mg | Freq: Four times a day (QID) | RECTAL | Status: DC | PRN

## 2013-12-28 MED ORDER — ASPIRIN EC 81 MG PO TBEC
81.0000 mg | DELAYED_RELEASE_TABLET | Freq: Every day | ORAL | Status: DC
  Administered 2013-12-29 – 2013-12-31 (×3): 81 mg via ORAL
  Filled 2013-12-28 (×4): qty 1

## 2013-12-28 MED ORDER — VANCOMYCIN HCL IN DEXTROSE 750-5 MG/150ML-% IV SOLN
750.0000 mg | INTRAVENOUS | Status: DC
  Administered 2013-12-30: 750 mg via INTRAVENOUS
  Filled 2013-12-28 (×2): qty 150

## 2013-12-28 MED ORDER — ONDANSETRON HCL 4 MG PO TABS: 4.0000 mg | ORAL_TABLET | Freq: Four times a day (QID) | ORAL | Status: DC | PRN

## 2013-12-28 MED ORDER — ENOXAPARIN SODIUM 30 MG/0.3ML ~~LOC~~ SOLN
30.0000 mg | SUBCUTANEOUS | Status: DC
  Administered 2013-12-28 – 2014-01-01 (×5): 30 mg via SUBCUTANEOUS
  Filled 2013-12-28 (×5): qty 0.3

## 2013-12-28 MED ORDER — METOPROLOL TARTRATE 1 MG/ML IV SOLN
5.0000 mg | Freq: Four times a day (QID) | INTRAVENOUS | Status: DC
  Administered 2013-12-28 – 2013-12-30 (×6): 5 mg via INTRAVENOUS
  Filled 2013-12-28 (×11): qty 5

## 2013-12-28 MED ORDER — ONDANSETRON HCL 4 MG/2ML IJ SOLN
4.0000 mg | Freq: Four times a day (QID) | INTRAMUSCULAR | Status: DC | PRN
  Administered 2013-12-31: 4 mg via INTRAVENOUS
  Filled 2013-12-28: qty 2

## 2013-12-28 MED ORDER — SODIUM CHLORIDE 0.9 % IJ SOLN
3.0000 mL | Freq: Two times a day (BID) | INTRAMUSCULAR | Status: DC
  Administered 2013-12-30: 3 mL via INTRAVENOUS

## 2013-12-28 MED ORDER — DEXTROSE 5 % IV SOLN
1.0000 g | INTRAVENOUS | Status: DC
  Administered 2013-12-28 – 2013-12-30 (×4): 1 g via INTRAVENOUS
  Filled 2013-12-28 (×5): qty 1

## 2013-12-28 MED ORDER — POLYETHYLENE GLYCOL 3350 17 G PO PACK
17.0000 g | PACK | Freq: Every day | ORAL | Status: DC
  Filled 2013-12-28 (×3): qty 1

## 2013-12-28 MED ORDER — VITAMIN D3 25 MCG (1000 UNIT) PO TABS
2000.0000 [IU] | ORAL_TABLET | Freq: Every day | ORAL | Status: DC
  Administered 2013-12-29 – 2014-01-01 (×4): 2000 [IU] via ORAL
  Filled 2013-12-28 (×5): qty 2

## 2013-12-28 MED ORDER — PANTOPRAZOLE SODIUM 40 MG PO TBEC
40.0000 mg | DELAYED_RELEASE_TABLET | Freq: Every day | ORAL | Status: DC
  Administered 2013-12-28 – 2013-12-31 (×4): 40 mg via ORAL
  Filled 2013-12-28 (×4): qty 1

## 2013-12-28 MED ORDER — ACETAMINOPHEN 325 MG PO TABS: 650.0000 mg | ORAL_TABLET | Freq: Four times a day (QID) | ORAL | Status: DC | PRN

## 2013-12-28 MED ORDER — FAMOTIDINE 20 MG PO TABS: 20.0000 mg | ORAL_TABLET | Freq: Two times a day (BID) | ORAL | Status: DC

## 2013-12-28 MED ORDER — ASPIRIN 81 MG PO TABS: 81.0000 mg | ORAL_TABLET | Freq: Every day | ORAL | Status: DC

## 2013-12-28 MED ORDER — DONEPEZIL HCL 10 MG PO TABS
10.0000 mg | ORAL_TABLET | Freq: Every day | ORAL | Status: DC
Start: 2013-12-28 — End: 2014-01-01
  Administered 2013-12-29 – 2013-12-30 (×2): 10 mg via ORAL
  Filled 2013-12-28 (×5): qty 1

## 2013-12-28 NOTE — ED Notes (Signed)
Please call family once Pt has a room [phone]

## 2013-12-28 NOTE — ED Notes (Signed)
CG-4 reported to Dr. Nanavati 

## 2013-12-28 NOTE — ED Notes (Signed)
Called lisa with speech therapy back due to not hearing back from her. She now advises that they are trying to prioritize patients and that they "will get to her" but no time frame given and it may not be today.

## 2013-12-28 NOTE — ED Notes (Signed)
In to feed pt lunch. Unable to get [place] or green beans fine enough for pt to eat safely. Called and ordered a puree diet.

## 2013-12-28 NOTE — Progress Notes (Signed)
12:25 AM      Cefepime order adjust for renal function. Current crcl 11.3 ml/min. Changed to 1gm q24h (range 500mg -1gm q24h for this crcl)   Janice Coffin

## 2013-12-28 NOTE — ED Notes (Signed)
CALLED [facility] TO CHECK ON DELAY. THEY ADVISE THAT NO ONE HAS ANSWERED PAGE THAT WAS SENT AT 1310. SHE THEN GAVE A PAGER NUMBER FOR LISA [phone] TO CALL AND SEE WHAT LISA CAN DO TO GET PT EVALUATED. AT FIRST THEY ADVISED THAT THEY WOULD BE SENDING SOMEONE. AFTER TALKING WITH LISA SHE NOW SAYES THAT SHE WILL FIND SOMEONE TO DO THE STUDY AND CALL ME BACK WITH WHO AND WHEN. SHE IS AWARE PT IS BEING ADMITTED AND THE CONCERN THAT PT WOULD GET LOST IN THE SHUFFLE AND NOT GET THE STUDY COMPLETED TODAY.

## 2013-12-28 NOTE — ED Notes (Signed)
CALLED PATTY ON 5W AND MADE HER AWARE THAT [facility] HAS NOT SEEN PT AND PT CONTINUES NPO

## 2013-12-28 NOTE — Progress Notes (Signed)
   [facility]                                                                              Patient Demographics  Emily Peters, is a 80 y.o. female, DOB - 07-15-1915, ESL:[id number]  Admit date - 12/27/2013   Admitting Physician No admitting provider for patient encounter.  Outpatient Primary MD for the patient is Fredderick Severance, MD  LOS - 1   Chief Complaint  Patient presents with  . Aspiration      HPI: Emily Peters is a 80 y.o. female with history of dementia, fibrillation, hypothyroidism, chronic kidney disease and CHF was brought to the ER after patient became confused hypoxic febrile. Patient has family has been having productive cough for last 2 days time last evening patient has been having some strawberries family. Later in the evening patient became confused febrile and hypoxic and was brought to the ER. Chest x-ray shows features concerning for pneumonia and also urinalysis shows possible UTI. Patient has been placed take antibiotics admitted for further workup. Patient did have one episode of nausea and vomiting en route to ER. On my exam patient is chest is somnolent.    Assessment & Plan   Patient admitted this morning. Agree with current assessment and plan done by Dr. Toniann Fail.   Acute encephalopathy -Likely secondary to multifactorial causes: Healthcare associated pneumonia versus UTI -Placed on cefepime and vancomycin -Pending influenza PCR and urine culture  Chronic systolic heart failure -Last EF was 40% -Lasix currently held, patient received fluids in the emergency department -Continue to monitor her intake and output, daily weights  Atrial fibrillation -Not on anticoagulation due to to GI bleed -Currently rate controlled  Dementia -Continue Aricept  Hypertension -Controlled, continue metoprolol  Hypothyroid -Continue Synthroid  Code Status: DO NOT RESUSCITATE  Family Communication: None at bedside  Disposition Plan: Admitted  Time Spent in  minutes   20 minutes  Procedures none  Consults  none  Kyeisha Janowicz D.O. on 12/28/2013 at 12:39 PM  Between 7am to 7pm - Pager - [phone]  After 7pm go to [email or web address] - password [email or web address]  And look for the night coverage person covering for me after hours  [facility] Office  [phone]

## 2013-12-28 NOTE — ED Notes (Addendum)
Lunch order called to service response for early delivery. Pt has no teeth. Eats a puree diet at nursing home. So puree diet ordered for pt

## 2013-12-28 NOTE — ED Notes (Signed)
[facility] HAS CALLED BACK. THEY WILL SEND SOMEONE TO ASSESS PT

## 2013-12-28 NOTE — ED Notes (Signed)
Admitting md at bedside

## 2013-12-28 NOTE — ED Notes (Signed)
Pt is sleeping. No complaints. Waiting for bed placement.

## 2013-12-28 NOTE — Progress Notes (Signed)
Admitted to room 5w6 from ED, oriented to name only, bed alarm on.

## 2013-12-28 NOTE — ED Notes (Addendum)
Dr Rhona Raider paged [phone]. concern voiced about pt swallowing. Have noticed when pt has liquids she coughs.

## 2013-12-28 NOTE — H&P (Signed)
[facility] History and Physical  BUENA BOEHM HQI:[id number] DOB: 1915/08/16 DOA: 12/27/2013  Referring physician: ER physician. PCP: Fredderick Severance, MD   History obtained from ER physician and patient's nurse as patient is confused.  Chief Complaint: Confusion fever and hypoxia.  HPI: Emily Peters is a 80 y.o. female with history of dementia, fibrillation, hypothyroidism, chronic kidney disease and CHF was brought to the ER after patient became confused hypoxic febrile. Patient has family has been having productive cough for last 2 days time last evening patient has been having some strawberries family. Later in the evening patient became confused febrile and hypoxic and was brought to the ER. Chest x-ray shows features concerning for pneumonia and also urinalysis shows possible UTI. Patient has been placed take antibiotics admitted for further workup. Patient did have one episode of nausea and vomiting en route to ER. On my exam patient is chest is somnolent.   Review of Systems: As presented in the history of presenting illness, rest negative.  Past Medical History  Diagnosis Date  . PAC (premature atrial contraction)   . Thyroid disease   . Hypertension   . Dementia   . Cataract   . Acid reflux   . CHF (congestive heart failure)   . Renal disorder   . Hypothyroidism   . Arthritis   . Peripheral vascular disease    Past Surgical History  Procedure Laterality Date  . Abdominal hysterectomy    . Eye surgery    . Esophageal dilation      x 2   Social History:  reports that she has never smoked. She has never used smokeless tobacco. She reports that she does not drink alcohol or use illicit drugs. Where does patient live nursing home. Can patient participate in ADLs? Not sure.  No Known Allergies  Family History:  Family History  Problem Relation Age of Onset  . Heart failure    . Heart attack        Prior to Admission medications   Medication Sig Start Date End  Date Taking? Authorizing Provider  aspirin 81 MG tablet Take 81 mg by mouth daily.   Yes Historical Provider, MD  cholecalciferol (VITAMIN D) 1000 UNITS tablet Take 2,000 Units by mouth daily.    Yes Historical Provider, MD  Cyanocobalamin (VITAMIN B-12 IJ) Inject 1 application as directed every 30 (thirty) days.    Yes Historical Provider, MD  docusate sodium (COLACE) 100 MG capsule Take 1 capsule (100 mg total) by mouth 2 (two) times daily. 08/10/13  Yes Russella Dar, NP  donepezil (ARICEPT) 10 MG tablet Take 10 mg by mouth at bedtime.   Yes Historical Provider, MD  ferrous sulfate 325 (65 FE) MG tablet Take 1 tablet (325 mg total) by mouth daily. 01/13/13  Yes Donnetta Hutching, MD  furosemide (LASIX) 40 MG tablet Take 1 tablet (40 mg total) by mouth daily. 07/31/13  Yes Costin Otelia Sergeant, MD  levothyroxine (SYNTHROID, LEVOTHROID) 112 MCG tablet Take 112 mcg by mouth daily before breakfast.   Yes Historical Provider, MD  metoprolol (LOPRESSOR) 50 MG tablet Take 50 mg by mouth 2 (two) times daily.   Yes Historical Provider, MD  omeprazole (PRILOSEC) 20 MG capsule Take 40 mg by mouth daily.   Yes Historical Provider, MD  polyethylene glycol (MIRALAX / GLYCOLAX) packet Take 17 g by mouth daily. 08/10/13  Yes Russella Dar, NP  ranitidine (ZANTAC) 150 MG tablet Take 150 mg by mouth daily.  Yes Historical Provider, MD  acetaminophen (TYLENOL) 325 MG tablet Take 650 mg by mouth every 6 (six) hours as needed for mild pain, fever or headache.    Historical Provider, MD  magnesium hydroxide (MILK OF MAGNESIA) 400 MG/5ML suspension Take 30 mLs by mouth daily as needed for mild constipation.    Historical Provider, MD  Skin Protectants, Misc. (EUCERIN) cream Apply 1 application topically 3 (three) times daily as needed for dry skin.     Historical Provider, MD    Physical Exam: Filed Vitals:   12/28/13 0115 12/28/13 0145 12/28/13 0215 12/28/13 0245  BP: 106/41 125/52 101/52 112/50  Pulse: 81 77 68 89   Temp:      TempSrc:      Resp:      Height:      Weight:      SpO2: 98% 98% 98% 99%     General:  Well-developed and nourished.  Eyes: Anicteric no pallor.  ENT: No discharge from the ears eyes nose mouth.  Neck: No mass felt.  Cardiovascular: S1-S2 heard.  Respiratory: No rhonchi or crepitations.  Abdomen: Soft nontender bowel sounds present.  Skin: Chronic skin rash on the lower extremities.  Musculoskeletal: No edema.  Psychiatric: Patient is somnolent.  Neurologic: Patient is somnolent.  Labs on Admission:  Basic Metabolic Panel:  Recent Labs Lab 12/27/13 2311  NA 140  K 4.1  CL 102  CO2 23  GLUCOSE 131*  BUN 58*  CREATININE 2.30*  CALCIUM 8.9   Liver Function Tests:  Recent Labs Lab 12/27/13 2311  AST 25  ALT 5  ALKPHOS 54  BILITOT 0.3  PROT 7.9  ALBUMIN 2.6*   No results found for this basename: LIPASE, AMYLASE,  in the last 168 hours No results found for this basename: AMMONIA,  in the last 168 hours CBC:  Recent Labs Lab 12/27/13 2311  WBC 5.4  HGB 11.0*  HCT 33.9*  MCV 100.9*  PLT 180   Cardiac Enzymes:  Recent Labs Lab 12/28/13 0010  TROPONINI <0.30    BNP (last 3 results)  Recent Labs  07/27/13 1130  PROBNP 10117.0*   CBG: No results found for this basename: GLUCAP,  in the last 168 hours  Radiological Exams on Admission: Dg Chest 2 View  12/28/2013   CLINICAL DATA:  Aspiration, shortness of breath.  EXAM: CHEST  2 VIEW  COMPARISON:  07/28/2013 and 04/14/2009  FINDINGS: Lungs are adequately inflated with minimal opacification over the posterior left base as cannot exclude atelectasis or infection. Minimal blunting of the right posterior costophrenic angle as cannot exclude a small amount of pleural fluid. There is mild stable cardiomegaly. There is moderate degenerative change of the spine. Known compression fracture in the region of the thoracolumbar junction. Old right rib fractures.  IMPRESSION: Mild  opacification over the posterior left base which may be due to atelectasis or infection. Possible small amount of posterior right pleural fluid.  Known compression fracture over the region of the thoracolumbar junction.   Electronically Signed   By: Elberta Fortis M.D.   On: 12/28/2013 00:04     Assessment/Plan Principal Problem:   Acute encephalopathy Active Problems:   HYPOTHYROIDISM   B12 DEFICIENCY   Dementia arising in the senium and presenium   HYPERTENSION   CKD (chronic kidney disease) stage 4, GFR 15-29 ml/min   Atrial fibrillation   HCAP (healthcare-associated pneumonia)   UTI (lower urinary tract infection)   Pneumonia   1. Acute encephalopathy most  likely secondary to pneumonia and UTI - patient has been placed on antibiotics for health care associated pneumonia and check influenza PCR and urine culture. Patient did receive fluids in the ER. Since patient has systolic heart failure we'll hold off further fluids. Hold Lasix for now. 2. Systolic heart failure last EF was 40% - holding Lasix for now. Patient did receive some IV fluids in the ER for now we'll hold off further fluids. Closely follow respiratory status intake output and metabolic panel. 3. Chronic kidney disease - see #2. 4. Atrial fibrillation not on Coumadin secondary to GI bleed - presently rate controlled. 5. Dementia - continue present medications. 6. Hypertension - continue present medications. 7. Hypothyroidism - continue Synthroid. 8. B12 deficiency..  I have reviewed patient's old chart and labs.    Code Status: DO NOT RESUSCITATE.  Family Communication: Family not at the bedside.  Disposition Plan: Admit to inpatient.    Eduard ClosArshad N Arie Powell [facility] Pager [phone].  If 7PM-7AM, please contact night-coverage [email or web address] Password [email or web address] 12/28/2013, 4:09 AM

## 2013-12-28 NOTE — ED Notes (Signed)
Called speech therapy and left message about swallow screening

## 2013-12-28 NOTE — Progress Notes (Addendum)
ANTIBIOTIC CONSULT NOTE - INITIAL  Pharmacy Consult for cefepime, vancomycin  Indication: HCAP  No Known Allergies  Patient Measurements: Height: 5\' 2"  (157.5 cm) Weight: 126 lb (57.153 kg) IBW/kg (Calculated) : 50.1   Vital Signs: Temp: 97.7 F (36.5 C) (04/09 0709) Temp src: Oral (04/09 0709) BP: 145/55 mmHg (04/09 0930) Pulse Rate: 65 (04/09 0930) Intake/Output from previous day:   Intake/Output from this shift:    Labs:  Recent Labs  12/27/13 2311 12/28/13 0443  WBC 5.4 4.6  HGB 11.0* 10.0*  PLT 180 174  CREATININE 2.30* 2.35*   Estimated Creatinine Clearance: 11.1 ml/min (by C-G formula based on Cr of 2.35). No results found for this basename: VANCOTROUGH, VANCOPEAK, VANCORANDOM, GENTTROUGH, GENTPEAK, GENTRANDOM, TOBRATROUGH, TOBRAPEAK, TOBRARND, AMIKACINPEAK, AMIKACINTROU, AMIKACIN,  in the last 72 hours   Microbiology: No results found for this or any previous visit (from the past 720 hour(s)).  Medical History: Past Medical History  Diagnosis Date  . PAC (premature atrial contraction)   . Thyroid disease   . Hypertension   . Dementia   . Cataract   . Acid reflux   . CHF (congestive heart failure)   . Renal disorder   . Hypothyroidism   . Arthritis   . Peripheral vascular disease    Assessment: 80 year old female presents to [facility] with confusion, fever, and hypoxia. Empiric vancomycin and cefepime started this morning. Orders received to continue cefepime dosing per rx. Patient with CKD with baseline scr 2.1-2.3 so considered at baseline currently. WBC normal, fever 101 overnight. Will renal dose adjust cefepime to q24 hour dosing.  Goal of Therapy:  Eradication of infection Vancomycin trough 15-20  Plan:  Cefepime 1g q24 hours Vancomycin 750 q48 hours - next dose 4/11 Follow fever curve, cbc, and culture data  Sheppard Coil PharmD., BCPS Clinical Pharmacist Pager [phone] 12/28/2013 10:23 AM

## 2013-12-29 ENCOUNTER — Inpatient Hospital Stay (HOSPITAL_COMMUNITY): Payer: Medicare HMO

## 2013-12-29 LAB — MRSA PCR SCREENING: MRSA by PCR: NEGATIVE

## 2013-12-29 MED ORDER — CHLORHEXIDINE GLUCONATE 0.12 % MT SOLN
15.0000 mL | Freq: Two times a day (BID) | OROMUCOSAL | Status: DC
  Administered 2013-12-29 – 2014-01-01 (×5): 15 mL via OROMUCOSAL
  Filled 2013-12-29 (×8): qty 15

## 2013-12-29 MED ORDER — BIOTENE DRY MOUTH MT LIQD
15.0000 mL | Freq: Two times a day (BID) | OROMUCOSAL | Status: DC
  Administered 2013-12-31 – 2014-01-01 (×3): 15 mL via OROMUCOSAL

## 2013-12-29 MED ORDER — GUAIFENESIN 100 MG/5ML PO SYRP
200.0000 mg | ORAL_SOLUTION | ORAL | Status: DC | PRN
  Administered 2013-12-29: 200 mg via ORAL
  Filled 2013-12-29: qty 10

## 2013-12-29 MED ORDER — ALBUTEROL SULFATE (2.5 MG/3ML) 0.083% IN NEBU
2.5000 mg | INHALATION_SOLUTION | Freq: Three times a day (TID) | RESPIRATORY_TRACT | Status: DC
  Administered 2013-12-29 (×3): 2.5 mg via RESPIRATORY_TRACT
  Filled 2013-12-29 (×3): qty 3

## 2013-12-29 NOTE — Clinical Social Work Psychosocial (Signed)
Clinical Social Work Department BRIEF PSYCHOSOCIAL ASSESSMENT 12/29/2013  Patient:  Emily Peters, Emily Peters     Account Number:  [id number]     Admit date:  12/27/2013  Clinical Social Worker:  Emily Peters  Date/Time:  12/29/2013 03:41 PM  Referred by:  Physician  Date Referred:  12/29/2013 Referred for  SNF Placement   Other Referral:   Interview type:  Family Other interview type:   Patient not oriented at time of assessment. CSW interviewed patient's son Emily Peters by phone as no family currently at bedside.    PSYCHOSOCIAL DATA Living Status:  ALONE Admitted from facility:  [facility] Level of care:  Skilled Nursing Facility Primary support name:  Emily Peters Primary support relationship to patient:  CHILD, ADULT Degree of support available:   Support is adequate.    CURRENT CONCERNS Current Concerns  Post-Acute Placement   Other Concerns:    SOCIAL WORK ASSESSMENT / PLAN CSW spoke with patient's son Emily Peters by phone to complete assessment. Emily Peters states that patient is from [facility] and that the family is happy with the care she receives there. Patient has been at [facility] since 08/10/14. Patient's son states, "She looks so much better than she did yesterday. We just want her to get well." CSW explained CSWs role in DC back to facility. CSW answered sons questions. Emily Peters states that prior to [facility] patient was living at home alone but them family "pitched in" to care for her.   Assessment/plan status:  Psychosocial Support/Ongoing Assessment of Needs Other assessment/ plan:   Complete FL2, Fax, PASRR   Information/referral to community resources:   CSW contact information given to son.    PATIENT'S/FAMILY'S RESPONSE TO PLAN OF CARE: Patient's son Emily Peters states that he plans for the patient to return to [place] at discharge. Emily Peters was pleasant, appropriate, and appreciative of CSW contact. Emily Peters is happy with the care his mother is receiving.  CSW will assist with DC when appropriate.       Emily Peters MSW, [place], [place], [phone]

## 2013-12-29 NOTE — Procedures (Signed)
Objective Swallowing Evaluation: Modified Barium Swallowing Study  Patient Details  Name: Emily Peters MRN: [id number] Date of Birth: December 10, 1914  Today's Date: 12/29/2013 Time: 1030-1045 SLP Time Calculation (min): 15 min  Past Medical History:  Past Medical History  Diagnosis Date  . PAC (premature atrial contraction)   . Thyroid disease   . Hypertension   . Dementia   . Cataract   . Acid reflux   . CHF (congestive heart failure)   . Renal disorder   . Hypothyroidism   . Arthritis   . Peripheral vascular disease    Past Surgical History:  Past Surgical History  Procedure Laterality Date  . Abdominal hysterectomy    . Eye surgery    . Esophageal dilation      x 2   HPI:  Emily Peters is a 80 y.o. female with history of dementia, fibrillation, hypothyroidism, chronic kidney disease and CHF was brought to the ER after patient became confused hypoxic febrile. Patient has family has been having productive cough for last 2 days time last evening patient has been having some strawberries family. Later in the evening patient became confused febrile and hypoxic and was brought to the ER. Chest x-ray shows features concerning for pneumonia and also urinalysis shows possible UTI. Patient has been placed take antibiotics admitted for further workup. Patient did have one episode of nausea and vomiting en route to ER.     Assessment / Plan / Recommendation Clinical Impression  Dysphagia Diagnosis: Mild pharyngeal phase dysphagia Clinical impression: Pt demonstrates slight oral dyspahgia with puree (brief tongue pumping), but functional except that she separates whole pill from bolus and is edentulous. Oropharyngeal phase characterzied by minimally delayed swallow response with intermittent deep flash penetration of thin. No true penetration and no aspiration observed. Pt may initate a puree diet (no dentition) and thin liquids with basic aspiration precautions. As previously diagnosed pt  appeared to have esophageal dysmotility and also needs basic aspiration precautions. Pills crushed in puree. No SLP f/u needed. Will sign off.     Treatment Recommendation  No treatment recommended at this time    Diet Recommendation Dysphagia 1 (Puree);Thin liquid   Liquid Administration via: Cup;Straw Medication Administration: Crushed with puree Supervision: Full supervision/cueing for compensatory strategies;Staff to assist with self feeding Compensations: Slow rate;Small sips/bites;Follow solids with liquid Postural Changes and/or Swallow Maneuvers: Seated upright 90 degrees;Upright 30-60 min after meal    Other  Recommendations Recommended Consults: MBS Oral Care Recommendations: Oral care BID   Follow Up Recommendations  Skilled Nursing facility    Frequency and Duration        Pertinent Vitals/Pain NA    SLP Swallow Goals     General HPI: Emily Peters is a 80 y.o. female with history of dementia, fibrillation, hypothyroidism, chronic kidney disease and CHF was brought to the ER after patient became confused hypoxic febrile. Patient has family has been having productive cough for last 2 days time last evening patient has been having some strawberries family. Later in the evening patient became confused febrile and hypoxic and was brought to the ER. Chest x-ray shows features concerning for pneumonia and also urinalysis shows possible UTI. Patient has been placed take antibiotics admitted for further workup. Patient did have one episode of nausea and vomiting en route to ER. Type of Study: Modified Barium Swallowing Study Reason for Referral: Objectively evaluate swallowing function Previous Swallow Assessment: Esophagram  Diet Prior to this Study: NPO Temperature Spikes Noted: No Respiratory  Status: Nasal cannula History of Recent Intubation: No Behavior/Cognition: Alert;Requires cueing Oral Cavity - Dentition: Edentulous Oral Motor / Sensory Function: Within functional  limits Self-Feeding Abilities: Total assist Patient Positioning: Upright in bed Baseline Vocal Quality: Low vocal intensity Volitional Cough: Cognitively unable to elicit Volitional Swallow: Unable to elicit Anatomy:  (prominent UES) Pharyngeal Secretions: Not observed secondary MBS    Reason for Referral Objectively evaluate swallowing function   Oral Phase Oral Preparation/Oral Phase Oral Phase: WFL   Pharyngeal Phase Pharyngeal Phase Pharyngeal Phase: Impaired Pharyngeal - Thin Pharyngeal - Thin Cup: Delayed swallow initiation;Penetration/Aspiration before swallow Penetration/Aspiration details (thin cup): Material enters airway, remains ABOVE vocal cords then ejected out;Material does not enter airway Pharyngeal - Thin Straw: Delayed swallow initiation;Penetration/Aspiration before swallow Penetration/Aspiration details (thin straw): Material does not enter airway;Material enters airway, remains ABOVE vocal cords then ejected out Pharyngeal - Solids Pharyngeal - Puree: Within functional limits  Cervical Esophageal Phase    GO    Cervical Esophageal Phase Cervical Esophageal Phase:  (Appearance of prominent CP, esophageal sweep shows dysmotili)        Emily DittyBonnie Rozanne Heumann, MA CCC-SLP [phone]  Emily Peters 12/29/2013, 11:20 AM

## 2013-12-29 NOTE — Evaluation (Signed)
Physical Therapy Evaluation Patient Details Name: Emily Peters MRN: [id number] DOB: 01/03/1915 Today's Date: 12/29/2013   History of Present Illness  Emily Peters is a 80 y.o. female with history of dementia, fibrillation, hypothyroidism, chronic kidney disease and CHF was brought to the ER after patient became confused hypoxic febrile. Patient has family has been having productive cough for last 2 days time last evening patient has been having some strawberries family. Later in the evening patient became confused febrile and hypoxic and was brought to the ER. Chest x-ray shows features concerning for pneumonia and also urinalysis shows possible UTI. Patient has been placed take antibiotics admitted for further workup. Patient did have one episode of nausea and vomiting en route to ER. On my exam patient is chest is somnolent.   Clinical Impression  Pt admitted with PNA and possible UTI. Pt currently with functional limitations due to the deficits listed below (see PT Problem List).  Pt will benefit from skilled PT to increase their independence and safety with mobility to allow discharge to the venue listed below. Pt from SNF and anticipate return to SNF at d/c.    Follow Up Recommendations SNF    Equipment Recommendations  None recommended by PT    Recommendations for Other Services       Precautions / Restrictions Precautions Precautions: Fall Restrictions Weight Bearing Restrictions: No      Mobility  Bed Mobility Overal bed mobility: Needs Assistance Bed Mobility: Rolling;Supine to Sit Rolling: Max assist   Supine to sit: Total assist;HOB elevated     General bed mobility comments: decreased initiation and slow to process.  With increased time pt. still needed increased assistance to perform bed mobility.  Transfers Overall transfer level: Needs assistance Equipment used: None Transfers: Sit to/from [employer] CorporationStand;Stand Pivot Transfers Sit to Stand: +2 physical assistance;Mod  assist Stand pivot transfers: +2 physical assistance;Mod assist       General transfer comment: decreased initiation  Ambulation/Gait                Stairs            Wheelchair Mobility    Modified Rankin (Stroke Patients Only)       Balance Overall balance assessment: Needs assistance Sitting-balance support: Feet supported;Bilateral upper extremity supported Sitting balance-Leahy Scale: Poor   Postural control: Left lateral lean Standing balance support: During functional activity;Bilateral upper extremity supported Standing balance-Leahy Scale: Zero                               Pertinent Vitals/Pain Pt on 2L O2 throughout session.  No acute distress noted    Home Living Family/patient expects to be discharged to:: Skilled nursing facility                 Additional Comments: no family present    Prior Function Level of Independence: Needs assistance   Gait / Transfers Assistance Needed: "I can't walk." No family present to provide PLOF           Hand Dominance   Dominant Hand: Right    Extremity/Trunk Assessment   Upper Extremity Assessment: Defer to OT evaluation           Lower Extremity Assessment: Generalized weakness      Cervical / Trunk Assessment: Kyphotic  Communication   Communication: HOH  Cognition Arousal/Alertness: Awake/alert Behavior During Therapy: WFL for tasks assessed/performed;Flat affect Overall Cognitive Status: History of  cognitive impairments - at baseline       Memory: Decreased short-term memory              General Comments      Exercises        Assessment/Plan    PT Assessment Patient needs continued PT services  PT Diagnosis Difficulty walking;Generalized weakness   PT Problem List Decreased strength;Decreased activity tolerance;Decreased balance;Decreased mobility;Decreased cognition;Decreased knowledge of use of DME;Decreased knowledge of  precautions;Decreased safety awareness  PT Treatment Interventions DME instruction;Gait training;Functional mobility training;Therapeutic activities;Therapeutic exercise;Balance training;Neuromuscular re-education;Cognitive remediation;Patient/family education   PT Goals (Current goals can be found in the Care Plan section) Acute Rehab PT Goals Patient Stated Goal: unable to state PT Goal Formulation: Patient unable to participate in goal setting Time For Goal Achievement: 01/12/14 Potential to Achieve Goals: Good    Frequency Min 2X/week   Barriers to discharge        Co-evaluation               End of Session Equipment Utilized During Treatment: Gait belt;Oxygen Activity Tolerance: Patient tolerated treatment well Patient left: in chair;with call bell/phone within reach Nurse Communication: Mobility status (RN tech present during session)         Time: [phone] PT Time Calculation (min): 19 min   Charges:   PT Evaluation $Initial PT Evaluation Tier I: 1 Procedure PT Treatments $Therapeutic Activity: 8-22 mins   PT G Codes:          Clarita Crane 12/29/2013, 9:51 AM  Clarita Crane, PT, DPT [phone]

## 2013-12-29 NOTE — Progress Notes (Signed)
PATIENT DETAILS Name: Emily Peters Age: 80 y.o. Sex: female Date of Birth: 1915/01/25 Admit Date: 12/27/2013 Admitting Physician Edsel Petrin, DO [id number], Jonny Ruiz, MD  Subjective: Pleasantly confused, but following some commands  Assessment/Plan: Principal Problem:   Acute encephalopathy -better -secondary to HCAP/Asp PNA and UTI -c/w Antibiotics  Active Problems: HCAP/Aspiration PNA -clinically improved -seems to be overtly aspirating-for a MBS later today-NPO till then -c/w Vancomycin/Cefepime-day 2 -blood culture 4/8 neg so far  UTI -c/w Cefepime -Urine culture-E coli-senistivity pending    HYPOTHYROIDISM -c/w levothyroxine  Dementia -currently stable-suspect close to baseline-after IV Abx therapy -c/w aricept  Iron deficiency -c/w Fe supplementation  GERD  -cont PPI daily   CHF, chronic (mild systolic EF40% and grade 2 DD on Nov 2014)  -compensated  -resume Lasix when oral intake resumed  Atrial fibrillation  -Not on anticoagulation due to to GI bleed-on ASA -Currently rate controlled with IV Lopressor-will change to oral route when oral intake is resumed  Disposition: Remain inpatient  DVT Prophylaxis: Prophylactic Lovenox   Code Status:  DNR  Family Communication None at bedside  Procedures:  None  CONSULTS:  None  Time spent 40 minutes-which includes 50% of the time with face-to-face with patient/ family and coordinating care related to the above assessment and plan.    MEDICATIONS: Scheduled Meds: . albuterol  2.5 mg Nebulization TID  . aspirin EC  81 mg Oral Daily  . ceFEPime (MAXIPIME) IV  1 g Intravenous Q24H  . cholecalciferol  2,000 Units Oral Daily  . docusate sodium  100 mg Oral BID  . donepezil  10 mg Oral QHS  . enoxaparin (LOVENOX) injection  30 mg Subcutaneous Q24H  . famotidine  20 mg Oral Daily  . ferrous sulfate  325 mg Oral Daily  . levothyroxine  112 mcg Oral QAC breakfast  . metoprolol  5 mg  Intravenous 4 times per day  . pantoprazole  40 mg Oral Daily  . polyethylene glycol  17 g Oral Daily  . sodium chloride  3 mL Intravenous Q12H  . [START ON 12/30/2013] vancomycin  750 mg Intravenous Q48H   Continuous Infusions: . sodium chloride 50 mL/hr at 12/28/13 1751   PRN Meds:.acetaminophen, acetaminophen, ondansetron (ZOFRAN) IV, ondansetron  Antibiotics: Anti-infectives   Start     Dose/Rate Route Frequency Ordered Stop   12/30/13 0800  vancomycin (VANCOCIN) IVPB 750 mg/150 ml premix     750 mg 150 mL/hr over 60 Minutes Intravenous Every 48 hours 12/28/13 1027     12/28/13 0100  ceFEPIme (MAXIPIME) 1 g in dextrose 5 % 50 mL IVPB     1 g 100 mL/hr over 30 Minutes Intravenous Every 24 hours 12/28/13 0021     12/28/13 0030  vancomycin (VANCOCIN) IVPB 1000 mg/200 mL premix     1,000 mg 200 mL/hr over 60 Minutes Intravenous  Once 12/28/13 0021 12/28/13 0428       PHYSICAL EXAM: Vital signs in last 24 hours: Filed Vitals:   12/28/13 1534 12/28/13 2035 12/29/13 0634 12/29/13 0819  BP: 158/77 160/75 144/86   Pulse: 76 80 98   Temp: 97.8 F (36.6 C) 98.4 F (36.9 C) 98.4 F (36.9 C)   TempSrc: Oral Oral Oral   Resp: 19 18 18    Height:      Weight: 54.477 kg (120 lb 1.6 oz)     SpO2: 100% 98% 98% 98%    Weight change: -2.676 kg (-5 lb 14.4  oz) Filed Weights   12/27/13 2241 12/28/13 1534  Weight: 57.153 kg (126 lb) 54.477 kg (120 lb 1.6 oz)   Body mass index is 21.96 kg/(m^2).   Gen Exam: Awake and pleasantly confused, with clear speech.   Neck: Supple, No JVD.   Chest: B/L Clear-with a few bibasilar rales  CVS: S1 S2 Regular, no murmurs.  Abdomen: soft, BS +, non tender, non distended.  Extremities: no edema, lower extremities warm to touch. Neurologic: Non Focal.   Skin: No Rash.   Wounds: N/A.   Intake/Output from previous day:  Intake/Output Summary (Last 24 hours) at 12/29/13 1004 Last data filed at 12/29/13 0900  Gross per 24 hour  Intake      0 ml   Output      4 ml  Net     -4 ml     LAB RESULTS: CBC  Recent Labs Lab 12/27/13 2311 12/28/13 0443  WBC 5.4 4.6  HGB 11.0* 10.0*  HCT 33.9* 31.3*  PLT 180 174  MCV 100.9* 100.6*  MCH 32.7 32.2  MCHC 32.4 31.9  RDW 14.5 14.7  LYMPHSABS  --  1.0  MONOABS  --  0.8  EOSABS  --  0.0  BASOSABS  --  0.0    Chemistries   Recent Labs Lab 12/27/13 2311 12/28/13 0443  NA 140 143  K 4.1 3.8  CL 102 107  CO2 23 23  GLUCOSE 131* 107*  BUN 58* 57*  CREATININE 2.30* 2.35*  CALCIUM 8.9 8.1*    CBG: No results found for this basename: GLUCAP,  in the last 168 hours  GFR Estimated Creatinine Clearance: 11.1 ml/min (by C-G formula based on Cr of 2.35).  Coagulation profile No results found for this basename: INR, PROTIME,  in the last 168 hours  Cardiac Enzymes  Recent Labs Lab 12/28/13 0010  TROPONINI <0.30    No components found with this basename: POCBNP,  No results found for this basename: DDIMER,  in the last 72 hours No results found for this basename: HGBA1C,  in the last 72 hours No results found for this basename: CHOL, HDL, LDLCALC, TRIG, CHOLHDL, LDLDIRECT,  in the last 72 hours No results found for this basename: TSH, T4TOTAL, FREET3, T3FREE, THYROIDAB,  in the last 72 hours No results found for this basename: VITAMINB12, FOLATE, FERRITIN, TIBC, IRON, RETICCTPCT,  in the last 72 hours No results found for this basename: LIPASE, AMYLASE,  in the last 72 hours  Urine Studies No results found for this basename: UACOL, UAPR, USPG, UPH, UTP, UGL, UKET, UBIL, UHGB, UNIT, UROB, ULEU, UEPI, UWBC, URBC, UBAC, CAST, CRYS, UCOM, BILUA,  in the last 72 hours  MICROBIOLOGY: Recent Results (from the past 240 hour(s))  CULTURE, BLOOD (ROUTINE X 2)     Status: None   Collection Time    12/27/13 11:48 PM      Result Value Ref Range Status   Specimen Description BLOOD LEFT ARM   Final   Special Requests BOTTLES DRAWN AEROBIC AND ANAEROBIC 10CC   Final   Culture   Setup Time     Final   Value: 12/28/2013 08:27     [facility]   Culture     Final   Value:        BLOOD CULTURE RECEIVED NO GROWTH TO DATE CULTURE WILL BE HELD FOR 5 DAYS BEFORE ISSUING A FINAL NEGATIVE REPORT     [facility]  Report Status PENDING   Incomplete  CULTURE, BLOOD (ROUTINE X 2)     Status: None   Collection Time    12/27/13 11:52 PM      Result Value Ref Range Status   Specimen Description BLOOD LEFT WRIST   Final   Special Requests BOTTLES DRAWN AEROBIC ONLY 10CC   Final   Culture  Setup Time     Final   Value: 12/28/2013 08:27     Performed at [facility]   Culture     Final   Value:        BLOOD CULTURE RECEIVED NO GROWTH TO DATE CULTURE WILL BE HELD FOR 5 DAYS BEFORE ISSUING A FINAL NEGATIVE REPORT     Performed at [facility]   Report Status PENDING   Incomplete  URINE CULTURE     Status: None   Collection Time    12/28/13 12:09 AM      Result Value Ref Range Status   Specimen Description URINE, CATHETERIZED   Final   Special Requests NONE   Final   Culture  Setup Time     Final   Value: 12/27/2013 01:45     Performed at [facility] Count     Final   Value: >=100,000 COLONIES/ML     Performed at [facility]   Culture     Final   Value: ESCHERICHIA COLI     Performed at [facility]   Report Status PENDING   Incomplete  MRSA PCR SCREENING     Status: None   Collection Time    12/29/13  1:05 AM      Result Value Ref Range Status   MRSA by PCR NEGATIVE  NEGATIVE Final   Comment:            The GeneXpert MRSA Assay (FDA     approved for NASAL specimens     only), is one component of a     comprehensive MRSA colonization     surveillance program. It is not     intended to diagnose MRSA     infection nor to guide or     monitor treatment for     MRSA infections.    RADIOLOGY STUDIES/RESULTS: Dg Chest 2 View  12/28/2013   CLINICAL DATA:  Aspiration,  shortness of breath.  EXAM: CHEST  2 VIEW  COMPARISON:  07/28/2013 and 04/14/2009  FINDINGS: Lungs are adequately inflated with minimal opacification over the posterior left base as cannot exclude atelectasis or infection. Minimal blunting of the right posterior costophrenic angle as cannot exclude a small amount of pleural fluid. There is mild stable cardiomegaly. There is moderate degenerative change of the spine. Known compression fracture in the region of the thoracolumbar junction. Old right rib fractures.  IMPRESSION: Mild opacification over the posterior left base which may be due to atelectasis or infection. Possible small amount of posterior right pleural fluid.  Known compression fracture over the region of the thoracolumbar junction.   Electronically Signed   By: Elberta Fortis M.D.   On: 12/28/2013 00:04    Shanker Levora Dredge, MD  [facility] Pager:[phone]  If 7PM-7AM, please contact night-coverage [email or web address] Password [email or web address] 12/29/2013, 10:04 AM   LOS: 2 days

## 2013-12-29 NOTE — Evaluation (Signed)
Clinical/Bedside Swallow Evaluation Patient Details  Name: Emily Peters MRN: [id number] Date of Birth: [birth year]  Today's Date: 12/29/2013 Time: [id number] SLP Time Calculation (min): 23 min  Past Medical History:  Past Medical History  Diagnosis Date  . PAC (premature atrial contraction)   . Thyroid disease   . Hypertension   . Dementia   . Cataract   . Acid reflux   . CHF (congestive heart failure)   . Renal disorder   . Hypothyroidism   . Arthritis   . Peripheral vascular disease    Past Surgical History:  Past Surgical History  Procedure Laterality Date  . Abdominal hysterectomy    . Eye surgery    . Esophageal dilation      x 2   HPI:  Emily Peters is a 80 y.o. female with history of dementia, fibrillation, hypothyroidism, chronic kidney disease and CHF was brought to the ER after patient became confused hypoxic febrile. Patient has family has been having productive cough for last 2 days time last evening patient has been having some strawberries family. Later in the evening patient became confused febrile and hypoxic and was brought to the ER. Chest x-ray shows features concerning for pneumonia and also urinalysis shows possible UTI. Patient has been placed take antibiotics admitted for further workup. Patient did have one episode of nausea and vomiting en route to ER.   Assessment / Plan / Recommendation Clinical Impression  Pt presents with baseline congested cough and coughing throughout exam, immediately following POs and otherwise. Subjectively pt has a delayed swallow and has potential for moderate aspiration risk. Her oral cavity is coated with back material and skin peels off with gentle swabbing. Pt will need oral care QID. She also has a history of esophageal stricture and dysmotility. Given that pts hypoxia coincided with PO intake, recommend MBS to ensure safest diet. SLP will f/u today at 10am. Pt may have meds in puree this am.     Aspiration Risk  Moderate     Diet Recommendation NPO except meds   Medication Administration: Whole meds with puree    Other  Recommendations Recommended Consults: MBS Oral Care Recommendations: Oral care Q4 per protocol   Follow Up Recommendations  Other (comment) (TBD)    Frequency and Duration        Pertinent Vitals/Pain NA    SLP Swallow Goals     Swallow Study Prior Functional Status       General HPI: Emily Peters is a 80 y.o. female with history of dementia, fibrillation, hypothyroidism, chronic kidney disease and CHF was brought to the ER after patient became confused hypoxic febrile. Patient has family has been having productive cough for last 2 days time last evening patient has been having some strawberries family. Later in the evening patient became confused febrile and hypoxic and was brought to the ER. Chest x-ray shows features concerning for pneumonia and also urinalysis shows possible UTI. Patient has been placed take antibiotics admitted for further workup. Patient did have one episode of nausea and vomiting en route to ER. Type of Study: Bedside swallow evaluation Previous Swallow Assessment: Esophagram  Diet Prior to this Study: NPO Temperature Spikes Noted: No Respiratory Status: Nasal cannula History of Recent Intubation: No Behavior/Cognition: Alert;Requires cueing Oral Cavity - Dentition: Edentulous Self-Feeding Abilities: Total assist Patient Positioning: Upright in bed Baseline Vocal Quality: Low vocal intensity Volitional Cough: Cognitively unable to elicit Volitional Swallow: Unable to elicit    Oral/Motor/Sensory Function Overall Oral  Motor/Sensory Function: Other (comment) (minimal effort due to mentation)   Ice Chips     Thin Liquid Thin Liquid: Impaired Presentation: Cup;Straw Pharyngeal  Phase Impairments: Suspected delayed Swallow;Decreased hyoid-laryngeal movement;Cough - Immediate;Cough - Delayed    Nectar Thick Nectar Thick Liquid: Not tested   Honey Thick  Honey Thick Liquid: Not tested   Puree Puree: Impaired Presentation: Spoon Pharyngeal Phase Impairments: Suspected delayed Swallow;Cough - Immediate   Solid   GO    Solid: Not tested      Harlon Ditty, MA CCC-SLP [phone]  Emily Peters Emily Peters 12/29/2013,9:03 AM

## 2013-12-29 NOTE — Progress Notes (Signed)
Utilization review completed.  

## 2013-12-30 LAB — CBC
HCT: 30.8 % — ABNORMAL LOW (ref 36.0–46.0)
Hemoglobin: 9.8 g/dL — ABNORMAL LOW (ref 12.0–15.0)
MCH: 32.3 pg (ref 26.0–34.0)
MCHC: 31.8 g/dL (ref 30.0–36.0)
MCV: 101.7 fL — AB (ref 78.0–100.0)
Platelets: 162 10*3/uL (ref 150–400)
RBC: 3.03 MIL/uL — AB (ref 3.87–5.11)
RDW: 14.6 % (ref 11.5–15.5)
WBC: 3.4 10*3/uL — AB (ref 4.0–10.5)

## 2013-12-30 LAB — BASIC METABOLIC PANEL
BUN: 48 mg/dL — ABNORMAL HIGH (ref 6–23)
CO2: 21 meq/L (ref 19–32)
CREATININE: 1.83 mg/dL — AB (ref 0.50–1.10)
Calcium: 8.2 mg/dL — ABNORMAL LOW (ref 8.4–10.5)
Chloride: 109 mEq/L (ref 96–112)
GFR calc Af Amer: 26 mL/min — ABNORMAL LOW (ref >90)
GFR calc non Af Amer: 22 mL/min — ABNORMAL LOW (ref >90)
Glucose, Bld: 94 mg/dL (ref 70–99)
Potassium: 3.9 mEq/L (ref 3.7–5.3)
SODIUM: 143 meq/L (ref 137–147)

## 2013-12-30 LAB — URINE CULTURE: Colony Count: >=100000

## 2013-12-30 MED ORDER — POLYETHYLENE GLYCOL 3350 17 G PO PACK
17.0000 g | PACK | Freq: Two times a day (BID) | ORAL | Status: DC
  Administered 2013-12-30 – 2013-12-31 (×4): 17 g via ORAL
  Filled 2013-12-30 (×6): qty 1

## 2013-12-30 MED ORDER — GUAIFENESIN ER 600 MG PO TB12
600.0000 mg | ORAL_TABLET | Freq: Two times a day (BID) | ORAL | Status: DC
  Administered 2013-12-30 (×2): 600 mg via ORAL
  Filled 2013-12-30 (×4): qty 1

## 2013-12-30 MED ORDER — ASPIRIN 81 MG PO TABS: 81.0000 mg | ORAL_TABLET | Freq: Every day | ORAL | Status: DC

## 2013-12-30 MED ORDER — FUROSEMIDE 40 MG PO TABS
40.0000 mg | ORAL_TABLET | Freq: Every day | ORAL | Status: DC
  Administered 2013-12-30 – 2014-01-01 (×3): 40 mg via ORAL
  Filled 2013-12-30 (×3): qty 1

## 2013-12-30 MED ORDER — ALBUTEROL SULFATE (2.5 MG/3ML) 0.083% IN NEBU: 2.5000 mg | INHALATION_SOLUTION | RESPIRATORY_TRACT | Status: DC | PRN

## 2013-12-30 MED ORDER — METOPROLOL TARTRATE 50 MG PO TABS
50.0000 mg | ORAL_TABLET | Freq: Two times a day (BID) | ORAL | Status: DC
  Administered 2013-12-30 – 2013-12-31 (×4): 50 mg via ORAL
  Filled 2013-12-30 (×4): qty 1

## 2013-12-30 NOTE — Progress Notes (Addendum)
PATIENT DETAILS Name: Emily Peters Age: 80 y.o. Sex: female Date of Birth: [birth year] Admit Date: 12/27/2013 Admitting Physician Edsel Petrin, DO [id number], Jonny Ruiz, MD  Subjective: Pleasantly confused,no major events overnight. Follows some commands  Assessment/Plan: Principal Problem:   Acute encephalopathy -better, not sure if back to baseline. Appears alert and calm, and follows some commands -secondary to HCAP/Asp PNA and UTI -c/w Antibiotics  Active Problems: HCAP/Aspiration PNA -clinically improved -c/w Vancomycin/Cefepime-day 3 -blood culture 4/8 neg so far -MBS done on 4/10 shows no aspiration, SNP recommending a dysphagia 1 diet with thin liquids. Will discuss with family, tried to call however no response.  UTI -c/w Cefepime-day 3 -Urine culture-E coli-pan sensitive    HYPOTHYROIDISM -c/w levothyroxine  Dementia -currently stable-suspect close to baseline-after IV Abx therapy -c/w aricept  History of chronic kidney disease stage III-4 - Creatinine close to baseline, in fact improved than on admission. Continue to monitor periodically.  Iron deficiency -c/w Fe supplementation  GERD  -cont PPI daily   CHF, chronic (mild systolic EF40% and grade 2 DD on Nov 2014)  -compensated  -resume Lasix   Atrial fibrillation  -Not on anticoagulation due to to GI bleed-on ASA -Currently rate controlled with IV Lopressor-will change to oral metoprolol today  Disposition: Remain inpatient  DVT Prophylaxis: Prophylactic Lovenox   Code Status:  DNR  Family Communication None at bedside- called [name] and Hope Sedler-not able to reach-will retry later  Procedures:  None  CONSULTS:  None  Time spent 40 minutes-which includes 50% of the time with face-to-face with patient/ family and coordinating care related to the above assessment and plan.    MEDICATIONS: Scheduled Meds: . albuterol  2.5 mg Nebulization TID  . antiseptic oral rinse  15 mL  Mouth Rinse q12n4p  . aspirin EC  81 mg Oral Daily  . ceFEPime (MAXIPIME) IV  1 g Intravenous Q24H  . chlorhexidine  15 mL Mouth Rinse BID  . cholecalciferol  2,000 Units Oral Daily  . docusate sodium  100 mg Oral BID  . donepezil  10 mg Oral QHS  . enoxaparin (LOVENOX) injection  30 mg Subcutaneous Q24H  . famotidine  20 mg Oral Daily  . ferrous sulfate  325 mg Oral Daily  . levothyroxine  112 mcg Oral QAC breakfast  . metoprolol  5 mg Intravenous 4 times per day  . pantoprazole  40 mg Oral Daily  . polyethylene glycol  17 g Oral Daily  . sodium chloride  3 mL Intravenous Q12H  . vancomycin  750 mg Intravenous Q48H   Continuous Infusions: . sodium chloride 50 mL/hr at 12/30/13 0835   PRN Meds:.acetaminophen, acetaminophen, guaifenesin, ondansetron (ZOFRAN) IV, ondansetron  Antibiotics: Anti-infectives   Start     Dose/Rate Route Frequency Ordered Stop   12/30/13 0800  vancomycin (VANCOCIN) IVPB 750 mg/150 ml premix     750 mg 150 mL/hr over 60 Minutes Intravenous Every 48 hours 12/28/13 1027     12/28/13 0100  ceFEPIme (MAXIPIME) 1 g in dextrose 5 % 50 mL IVPB     1 g 100 mL/hr over 30 Minutes Intravenous Every 24 hours 12/28/13 0021     12/28/13 0030  vancomycin (VANCOCIN) IVPB 1000 mg/200 mL premix     1,000 mg 200 mL/hr over 60 Minutes Intravenous  Once 12/28/13 0021 12/28/13 0428       PHYSICAL EXAM: Vital signs in last 24 hours: Filed Vitals:   12/29/13 1700 12/29/13 2121  12/29/13 2353 12/30/13 0650  BP: 158/75 152/87 160/80 175/91  Pulse: 76 97 105 73  Temp:  98.6 F (37 C)  98.1 F (36.7 C)  TempSrc:  Oral  Oral  Resp:  18  18  Height:      Weight:      SpO2:  98%  93%    Weight change:  Filed Weights   12/27/13 2241 12/28/13 1534  Weight: 57.153 kg (126 lb) 54.477 kg (120 lb 1.6 oz)   Body mass index is 21.96 kg/(m^2).   Gen Exam: Awake and pleasantly confused, with clear speech.   Neck: Supple, No JVD.   Chest: B/L Clear-coarse breath sounds  with rales bibasilar CVS: S1 S2 Regular, no murmurs.  Abdomen: soft, BS +, non tender, non distended.  Extremities: no edema, lower extremities warm to touch. Neurologic: Non Focal.   Skin: No Rash.   Wounds: N/A.   Intake/Output from previous day:  Intake/Output Summary (Last 24 hours) at 12/30/13 0942 Last data filed at 12/29/13 1719  Gross per 24 hour  Intake 875.84 ml  Output      0 ml  Net 875.84 ml     LAB RESULTS: CBC  Recent Labs Lab 12/27/13 2311 12/28/13 0443 12/30/13 0750  WBC 5.4 4.6 3.4*  HGB 11.0* 10.0* 9.8*  HCT 33.9* 31.3* 30.8*  PLT 180 174 162  MCV 100.9* 100.6* 101.7*  MCH 32.7 32.2 32.3  MCHC 32.4 31.9 31.8  RDW 14.5 14.7 14.6  LYMPHSABS  --  1.0  --   MONOABS  --  0.8  --   EOSABS  --  0.0  --   BASOSABS  --  0.0  --     Chemistries   Recent Labs Lab 12/27/13 2311 12/28/13 0443 12/30/13 0750  NA 140 143 143  K 4.1 3.8 3.9  CL 102 107 109  CO2 23 23 21   GLUCOSE 131* 107* 94  BUN 58* 57* 48*  CREATININE 2.30* 2.35* 1.83*  CALCIUM 8.9 8.1* 8.2*    CBG: No results found for this basename: GLUCAP,  in the last 168 hours  GFR Estimated Creatinine Clearance: 14.2 ml/min (by C-G formula based on Cr of 1.83).  Coagulation profile No results found for this basename: INR, PROTIME,  in the last 168 hours  Cardiac Enzymes  Recent Labs Lab 12/28/13 0010  TROPONINI <0.30    No components found with this basename: POCBNP,  No results found for this basename: DDIMER,  in the last 72 hours No results found for this basename: HGBA1C,  in the last 72 hours No results found for this basename: CHOL, HDL, LDLCALC, TRIG, CHOLHDL, LDLDIRECT,  in the last 72 hours No results found for this basename: TSH, T4TOTAL, FREET3, T3FREE, THYROIDAB,  in the last 72 hours No results found for this basename: VITAMINB12, FOLATE, FERRITIN, TIBC, IRON, RETICCTPCT,  in the last 72 hours No results found for this basename: LIPASE, AMYLASE,  in the last 72  hours  Urine Studies No results found for this basename: UACOL, UAPR, USPG, UPH, UTP, UGL, UKET, UBIL, UHGB, UNIT, UROB, ULEU, UEPI, UWBC, URBC, UBAC, CAST, CRYS, UCOM, BILUA,  in the last 72 hours  MICROBIOLOGY: Recent Results (from the past 240 hour(s))  CULTURE, BLOOD (ROUTINE X 2)     Status: None   Collection Time    12/27/13 11:48 PM      Result Value Ref Range Status   Specimen Description BLOOD LEFT ARM   Final  Special Requests BOTTLES DRAWN AEROBIC AND ANAEROBIC 10CC   Final   Culture  Setup Time     Final   Value: 12/28/2013 08:27     Performed at [facility]   Culture     Final   Value:        BLOOD CULTURE RECEIVED NO GROWTH TO DATE CULTURE WILL BE HELD FOR 5 DAYS BEFORE ISSUING A FINAL NEGATIVE REPORT     Performed at [facility]   Report Status PENDING   Incomplete  CULTURE, BLOOD (ROUTINE X 2)     Status: None   Collection Time    12/27/13 11:52 PM      Result Value Ref Range Status   Specimen Description BLOOD LEFT WRIST   Final   Special Requests BOTTLES DRAWN AEROBIC ONLY 10CC   Final   Culture  Setup Time     Final   Value: 12/28/2013 08:27     Performed at [facility]   Culture     Final   Value:        BLOOD CULTURE RECEIVED NO GROWTH TO DATE CULTURE WILL BE HELD FOR 5 DAYS BEFORE ISSUING A FINAL NEGATIVE REPORT     Performed at [facility]   Report Status PENDING   Incomplete  URINE CULTURE     Status: None   Collection Time    12/28/13 12:09 AM      Result Value Ref Range Status   Specimen Description URINE, CATHETERIZED   Final   Special Requests NONE   Final   Culture  Setup Time     Final   Value: 12/27/2013 01:45     Performed at [facility] Count     Final   Value: >=100,000 COLONIES/ML     Performed at [facility]   Culture     Final   Value: ESCHERICHIA COLI     Performed at [facility]   Report Status 12/30/2013 FINAL   Final   Organism ID, Bacteria ESCHERICHIA  COLI   Final  MRSA PCR SCREENING     Status: None   Collection Time    12/29/13  1:05 AM      Result Value Ref Range Status   MRSA by PCR NEGATIVE  NEGATIVE Final   Comment:            The GeneXpert MRSA Assay (FDA     approved for NASAL specimens     only), is one component of a     comprehensive MRSA colonization     surveillance program. It is not     intended to diagnose MRSA     infection nor to guide or     monitor treatment for     MRSA infections.    RADIOLOGY STUDIES/RESULTS: Dg Chest 2 View  12/28/2013   CLINICAL DATA:  Aspiration, shortness of breath.  EXAM: CHEST  2 VIEW  COMPARISON:  07/28/2013 and 04/14/2009  FINDINGS: Lungs are adequately inflated with minimal opacification over the posterior left base as cannot exclude atelectasis or infection. Minimal blunting of the right posterior costophrenic angle as cannot exclude a small amount of pleural fluid. There is mild stable cardiomegaly. There is moderate degenerative change of the spine. Known compression fracture in the region of the thoracolumbar junction. Old right rib fractures.  IMPRESSION: Mild opacification over the posterior left base which may be due to atelectasis or infection. Possible small amount  of posterior right pleural fluid.  Known compression fracture over the region of the thoracolumbar junction.   Electronically Signed   By: Elberta Fortis M.D.   On: 12/28/2013 00:04    Mylin Hirano Levora Dredge, MD  [facility] Pager:[phone]  If 7PM-7AM, please contact night-coverage [email or web address] Password [email or web address] 12/30/2013, 9:42 AM   LOS: 3 days

## 2013-12-31 DIAGNOSIS — N184 Chronic kidney disease, stage 4 (severe): Secondary | ICD-10-CM

## 2013-12-31 MED ORDER — GUAIFENESIN 100 MG/5ML PO SOLN
300.0000 mg | Freq: Four times a day (QID) | ORAL | Status: DC
  Administered 2014-01-01 (×2): 300 mg via ORAL
  Filled 2013-12-31 (×4): qty 15

## 2013-12-31 MED ORDER — METOPROLOL TARTRATE 25 MG/10 ML ORAL SUSPENSION
50.0000 mg | Freq: Two times a day (BID) | ORAL | Status: DC
  Administered 2014-01-01: 50 mg via ORAL
  Filled 2013-12-31 (×3): qty 20

## 2013-12-31 MED ORDER — DOCUSATE SODIUM 50 MG/5ML PO LIQD
100.0000 mg | Freq: Two times a day (BID) | ORAL | Status: DC
  Filled 2013-12-31 (×2): qty 10

## 2013-12-31 MED ORDER — FERROUS SULFATE 300 (60 FE) MG/5ML PO SYRP
325.0000 mg | ORAL_SOLUTION | Freq: Every day | ORAL | Status: DC
  Filled 2013-12-31 (×2): qty 10

## 2013-12-31 MED ORDER — ASPIRIN 81 MG PO CHEW
81.0000 mg | CHEWABLE_TABLET | Freq: Every day | ORAL | Status: DC
  Administered 2014-01-01: 81 mg via ORAL
  Filled 2013-12-31: qty 1

## 2013-12-31 MED ORDER — LEVOFLOXACIN 500 MG PO TABS
500.0000 mg | ORAL_TABLET | ORAL | Status: DC
  Administered 2013-12-31: 500 mg via ORAL
  Filled 2013-12-31: qty 1

## 2013-12-31 MED ORDER — PANTOPRAZOLE SODIUM 40 MG PO PACK
40.0000 mg | PACK | Freq: Every day | ORAL | Status: DC
  Administered 2014-01-01: 40 mg via ORAL
  Filled 2013-12-31: qty 20

## 2013-12-31 MED ORDER — ACETAMINOPHEN 160 MG/5ML PO SOLN: 650.0000 mg | Freq: Four times a day (QID) | ORAL | Status: DC | PRN

## 2013-12-31 NOTE — Progress Notes (Signed)
PATIENT DETAILS Name: Emily Peters Age: 80 y.o. Sex: female Date of Birth: 1915/07/15 Admit Date: 12/27/2013 Admitting Physician Edsel Petrin, DO [id number], Jonny Ruiz, MD  Subjective: No major issues, awake and calm. Remains pleasantly confused.  Assessment/Plan: Principal Problem:   Acute encephalopathy -better, back to baseline. Appears alert and calm, and follows some commands -secondary to HCAP/Asp PNA and UTI -c/w Antibiotics  Active Problems: HCAP/Aspiration PNA -clinically improved -Completed 4 days of Vancomycin/Cefepime, will stop Vanco today, andl transition of Levaquin on discharge. -blood culture 4/8 neg so far -MBS done on 4/10 shows no aspiration, SNP recommending a dysphagia 1 diet with thin liquids. -Discussed with son-[name] on 4/11, who is aware of this issue,and accepts risk of aspiration. Per son, patient has been on a Dys 1 diet for some time now.  UTI -c/w Cefepime-day 4, transition to levaquin on discharge. -Urine culture-E coli-pan sensitive    HYPOTHYROIDISM -c/w levothyroxine  Dementia -currently stable-suspect close to baseline-after IV Abx therapy -c/w aricept  History of chronic kidney disease stage III-4 - Creatinine close to baseline, in fact improved than on admission. Continue to monitor periodically.  Iron deficiency -c/w Fe supplementation  GERD  -cont PPI daily   CHF, chronic (mild systolic EF40% and grade 2 DD on Nov 2014)  -compensated  -resume Lasix   Atrial fibrillation  -Not on anticoagulation due to to GI bleed-on ASA -Currently rate controlled with IV Lopressor-will change to oral metoprolol today  Disposition: Remain inpatient-SNF Monday  DVT Prophylaxis: Prophylactic Lovenox   Code Status:  DNR-reconfirmed with son 4/11  Family Communication None at bedside- called [name] Kewley-on 4/11  Procedures:  None  CONSULTS:  None   MEDICATIONS: Scheduled Meds: . antiseptic oral rinse  15 mL Mouth Rinse  q12n4p  . aspirin EC  81 mg Oral Daily  . ceFEPime (MAXIPIME) IV  1 g Intravenous Q24H  . chlorhexidine  15 mL Mouth Rinse BID  . cholecalciferol  2,000 Units Oral Daily  . docusate sodium  100 mg Oral BID  . donepezil  10 mg Oral QHS  . enoxaparin (LOVENOX) injection  30 mg Subcutaneous Q24H  . famotidine  20 mg Oral Daily  . ferrous sulfate  325 mg Oral Daily  . furosemide  40 mg Oral Daily  . guaiFENesin  600 mg Oral BID  . levothyroxine  112 mcg Oral QAC breakfast  . metoprolol  50 mg Oral BID  . pantoprazole  40 mg Oral Daily  . polyethylene glycol  17 g Oral BID  . sodium chloride  3 mL Intravenous Q12H  . vancomycin  750 mg Intravenous Q48H   Continuous Infusions: . sodium chloride 10 mL/hr at 12/30/13 1026   PRN Meds:.acetaminophen, acetaminophen, albuterol, ondansetron (ZOFRAN) IV, ondansetron  Antibiotics: Anti-infectives   Start     Dose/Rate Route Frequency Ordered Stop   12/30/13 0800  vancomycin (VANCOCIN) IVPB 750 mg/150 ml premix     750 mg 150 mL/hr over 60 Minutes Intravenous Every 48 hours 12/28/13 1027     12/28/13 0100  ceFEPIme (MAXIPIME) 1 g in dextrose 5 % 50 mL IVPB     1 g 100 mL/hr over 30 Minutes Intravenous Every 24 hours 12/28/13 0021     12/28/13 0030  vancomycin (VANCOCIN) IVPB 1000 mg/200 mL premix     1,000 mg 200 mL/hr over 60 Minutes Intravenous  Once 12/28/13 0021 12/28/13 0428       PHYSICAL EXAM: Vital signs in  last 24 hours: Filed Vitals:   12/30/13 0650 12/30/13 1325 12/30/13 2100 12/31/13 0501  BP: 175/91 155/78 173/68 158/66  Pulse: 73 80 67 67  Temp: 98.1 F (36.7 C) 98.4 F (36.9 C) 98.2 F (36.8 C) 98.4 F (36.9 C)  TempSrc: Oral Oral Oral Oral  Resp: 18 18 18 18   Height:      Weight:      SpO2: 93% 97% 99% 99%    Weight change:  Filed Weights   12/27/13 2241 12/28/13 1534  Weight: 57.153 kg (126 lb) 54.477 kg (120 lb 1.6 oz)   Body mass index is 21.96 kg/(m^2).   Gen Exam: Awake and pleasantly confused,  with clear speech.   Neck: Supple, No JVD.   Chest: B/L Clear-still with coarse breath sounds with rales bibasilar CVS: S1 S2 Regular, no murmurs.  Abdomen: soft, BS +, non tender, non distended.  Extremities: no edema, lower extremities warm to touch. Neurologic: Non Focal.   Skin: No Rash.   Wounds: N/A.   Intake/Output from previous day:  Intake/Output Summary (Last 24 hours) at 12/31/13 0906 Last data filed at 12/31/13 0600  Gross per 24 hour  Intake 1771.5 ml  Output      0 ml  Net 1771.5 ml     LAB RESULTS: CBC  Recent Labs Lab 12/27/13 2311 12/28/13 0443 12/30/13 0750  WBC 5.4 4.6 3.4*  HGB 11.0* 10.0* 9.8*  HCT 33.9* 31.3* 30.8*  PLT 180 174 162  MCV 100.9* 100.6* 101.7*  MCH 32.7 32.2 32.3  MCHC 32.4 31.9 31.8  RDW 14.5 14.7 14.6  LYMPHSABS  --  1.0  --   MONOABS  --  0.8  --   EOSABS  --  0.0  --   BASOSABS  --  0.0  --     Chemistries   Recent Labs Lab 12/27/13 2311 12/28/13 0443 12/30/13 0750  NA 140 143 143  K 4.1 3.8 3.9  CL 102 107 109  CO2 23 23 21   GLUCOSE 131* 107* 94  BUN 58* 57* 48*  CREATININE 2.30* 2.35* 1.83*  CALCIUM 8.9 8.1* 8.2*    CBG: No results found for this basename: GLUCAP,  in the last 168 hours  GFR Estimated Creatinine Clearance: 14.2 ml/min (by C-G formula based on Cr of 1.83).  Coagulation profile No results found for this basename: INR, PROTIME,  in the last 168 hours  Cardiac Enzymes  Recent Labs Lab 12/28/13 0010  TROPONINI <0.30    No components found with this basename: POCBNP,  No results found for this basename: DDIMER,  in the last 72 hours No results found for this basename: HGBA1C,  in the last 72 hours No results found for this basename: CHOL, HDL, LDLCALC, TRIG, CHOLHDL, LDLDIRECT,  in the last 72 hours No results found for this basename: TSH, T4TOTAL, FREET3, T3FREE, THYROIDAB,  in the last 72 hours No results found for this basename: VITAMINB12, FOLATE, FERRITIN, TIBC, IRON,  RETICCTPCT,  in the last 72 hours No results found for this basename: LIPASE, AMYLASE,  in the last 72 hours  Urine Studies No results found for this basename: UACOL, UAPR, USPG, UPH, UTP, UGL, UKET, UBIL, UHGB, UNIT, UROB, ULEU, UEPI, UWBC, URBC, UBAC, CAST, CRYS, UCOM, BILUA,  in the last 72 hours  MICROBIOLOGY: Recent Results (from the past 240 hour(s))  CULTURE, BLOOD (ROUTINE X 2)     Status: None   Collection Time    12/27/13 11:48 PM  Result Value Ref Range Status   Specimen Description BLOOD LEFT ARM   Final   Special Requests BOTTLES DRAWN AEROBIC AND ANAEROBIC 10CC   Final   Culture  Setup Time     Final   Value: 12/28/2013 08:27     [facility]   Culture     Final   Value:        BLOOD CULTURE RECEIVED NO GROWTH TO DATE CULTURE WILL BE HELD FOR 5 DAYS BEFORE ISSUING A FINAL NEGATIVE REPORT     [facility]   Report Status PENDING   Incomplete  CULTURE, BLOOD (ROUTINE X 2)     Status: None   Collection Time    12/27/13 11:52 PM      Result Value Ref Range Status   Specimen Description BLOOD LEFT WRIST   Final   Special Requests BOTTLES DRAWN AEROBIC ONLY 10CC   Final   Culture  Setup Time     Final   Value: 12/28/2013 08:27     [facility]formed [facility]   Culture     Final   Value:        BLOOD CULTURE RECEIVED NO GROWTH TO DATE CULTURE WILL BE HELD FOR 5 DAYS BEFORE ISSUING A FINAL NEGATIVE REPORT     [facility]formed [facility]   Report Status PENDING   Incomplete  URINE CULTURE     Status: None   Collection Time    12/28/13 12:09 AM      Result Value Ref Range Status   Specimen Description URINE, CATHETERIZED   Final   Special Requests NONE   Final   Culture  Setup Time     Final   Value: 12/27/2013 01:45     [facility]   Colony Count     Final   Value: >=100,000 COLONIES/ML     Performed at [facility]   Culture     Final   Value: ESCHERICHIA COLI     Performed at  [facility]   Report Status 12/30/2013 FINAL   Final   Organism ID, Bacteria ESCHERICHIA COLI   Final  MRSA PCR SCREENING     Status: None   Collection Time    12/29/13  1:05 AM      Result Value Ref Range Status   MRSA by PCR NEGATIVE  NEGATIVE Final   Comment:            The GeneXpert MRSA Assay (FDA     approved for NASAL specimens     only), is one component of a     comprehensive MRSA colonization     surveillance program. It is not     intended to diagnose MRSA     infection nor to guide or     monitor treatment for     MRSA infections.    RADIOLOGY STUDIES/RESULTS: Dg Chest 2 View  12/28/2013   CLINICAL DATA:  Aspiration, shortness of breath.  EXAM: CHEST  2 VIEW  COMPARISON:  07/28/2013 and 04/14/2009  FINDINGS: Lungs are adequately inflated with minimal opacification over the posterior left base as cannot exclude atelectasis or infection. Minimal blunting of the right posterior costophrenic angle as cannot exclude a small amount of pleural fluid. There is mild stable cardiomegaly. There is moderate degenerative change of the spine. Known compression fracture in the region of the thoracolumbar junction. Old right rib fractures.  IMPRESSION: Mild  opacification over the posterior left base which may be due to atelectasis or infection. Possible small amount of posterior right pleural fluid.  Known compression fracture over the region of the thoracolumbar junction.   Electronically Signed   By: Elberta Fortis M.D.   On: 12/28/2013 00:04    Kelani Robart Levora Dredge, MD  [facility] Pager:[phone]  If 7PM-7AM, please contact night-coverage [email or web address] Password [email or web address] 12/31/2013, 9:06 AM   LOS: 4 days

## 2014-01-01 MED ORDER — ALBUTEROL SULFATE (2.5 MG/3ML) 0.083% IN NEBU: 2.5000 mg | INHALATION_SOLUTION | Freq: Three times a day (TID) | RESPIRATORY_TRACT | Status: DC

## 2014-01-01 MED ORDER — HYDRALAZINE HCL 20 MG/ML IJ SOLN: 10.0000 mg | Freq: Four times a day (QID) | INTRAMUSCULAR | Status: DC | PRN

## 2014-01-01 MED ORDER — LEVOFLOXACIN 500 MG PO TABS: 500.0000 mg | ORAL_TABLET | ORAL | Status: DC

## 2014-01-01 MED ORDER — AMLODIPINE BESYLATE 5 MG PO TABS
5.0000 mg | ORAL_TABLET | Freq: Every day | ORAL | Status: DC
  Administered 2014-01-01: 5 mg via ORAL
  Filled 2014-01-01: qty 1

## 2014-01-01 MED ORDER — AMLODIPINE BESYLATE 5 MG PO TABS: 5.0000 mg | ORAL_TABLET | Freq: Every day | ORAL | Status: DC

## 2014-01-01 NOTE — Discharge Summary (Signed)
PATIENT DETAILS Name: Emily Peters Age: 80 y.o. Sex: female Date of Birth: 22-Apr-1915 MRN: [id number]. Admit Date: 12/27/2013 Admitting Physician: Edsel Petrin, DO WUJ:[id number], Jonny Ruiz, MD  Recommendations for Outpatient Follow-up:  1. Dysphagia 1 diet 2. Full aspiration precautions at all times, feeding with assistance only 3. Check CBC/BMET in 1-2 weeks  PRIMARY DISCHARGE DIAGNOSIS:  Principal Problem:   Acute encephalopathy Active Problems:   HYPOTHYROIDISM   B12 DEFICIENCY   Dementia arising in the senium and presenium   HYPERTENSION   CKD (chronic kidney disease) stage 4, GFR 15-29 ml/min   Atrial fibrillation   HCAP (healthcare-associated pneumonia)   UTI (lower urinary tract infection)   Pneumonia      PAST MEDICAL HISTORY: Past Medical History  Diagnosis Date  . PAC (premature atrial contraction)   . Thyroid disease   . Hypertension   . Dementia   . Cataract   . Acid reflux   . CHF (congestive heart failure)   . Renal disorder   . Hypothyroidism   . Arthritis   . Peripheral vascular disease     DISCHARGE MEDICATIONS:   Medication List         acetaminophen 325 MG tablet  Commonly known as:  TYLENOL  Take 650 mg by mouth every 6 (six) hours as needed for mild pain, fever or headache.     albuterol (2.5 MG/3ML) 0.083% nebulizer solution  Commonly known as:  PROVENTIL  Take 3 mLs (2.5 mg total) by nebulization 3 (three) times daily.     amLODipine 5 MG tablet  Commonly known as:  NORVASC  Take 1 tablet (5 mg total) by mouth daily.     aspirin 81 MG tablet  Take 81 mg by mouth daily.     cholecalciferol 1000 UNITS tablet  Commonly known as:  VITAMIN D  Take 2,000 Units by mouth daily.     docusate sodium 100 MG capsule  Commonly known as:  COLACE  Take 1 capsule (100 mg total) by mouth 2 (two) times daily.     donepezil 10 MG tablet  Commonly known as:  ARICEPT  Take 10 mg by mouth at bedtime.     eucerin cream  Apply 1 application  topically 3 (three) times daily as needed for dry skin.     ferrous sulfate 325 (65 FE) MG tablet  Take 1 tablet (325 mg total) by mouth daily.     furosemide 40 MG tablet  Commonly known as:  LASIX  Take 1 tablet (40 mg total) by mouth daily.     levofloxacin 500 MG tablet  Commonly known as:  LEVAQUIN  Take 1 tablet (500 mg total) by mouth every other day. For 2 more doses and then stop     levothyroxine 112 MCG tablet  Commonly known as:  SYNTHROID, LEVOTHROID  Take 112 mcg by mouth daily before breakfast.     magnesium hydroxide 400 MG/5ML suspension  Commonly known as:  MILK OF MAGNESIA  Take 30 mLs by mouth daily as needed for mild constipation.     metoprolol 50 MG tablet  Commonly known as:  LOPRESSOR  Take 50 mg by mouth 2 (two) times daily.     omeprazole 20 MG capsule  Commonly known as:  PRILOSEC  Take 40 mg by mouth daily.     polyethylene glycol packet  Commonly known as:  MIRALAX / GLYCOLAX  Take 17 g by mouth daily.     ranitidine 150 MG tablet  Commonly known as:  ZANTAC  Take 150 mg by mouth daily.     VITAMIN B-12 IJ  Inject 1 application as directed every 30 (thirty) days.        ALLERGIES:  No Known Allergies  BRIEF HPI:  See H&P, Labs, Consult and Test reports for all details in brief, Emily Peters is a 80 y.o. female with history of dementia, fibrillation, hypothyroidism, chronic kidney disease and CHF was brought to the ER after she was found to be significantly confused.Chest x-ray shows features concerning for pneumonia and also urinalysis shows possible UTI.  CONSULTATIONS:   None  PERTINENT RADIOLOGIC STUDIES: Dg Chest 2 View  12/28/2013   CLINICAL DATA:  Aspiration, shortness of breath.  EXAM: CHEST  2 VIEW  COMPARISON:  07/28/2013 and 04/14/2009  FINDINGS: Lungs are adequately inflated with minimal opacification over the posterior left base as cannot exclude atelectasis or infection. Minimal blunting of the right posterior  costophrenic angle as cannot exclude a small amount of pleural fluid. There is mild stable cardiomegaly. There is moderate degenerative change of the spine. Known compression fracture in the region of the thoracolumbar junction. Old right rib fractures.  IMPRESSION: Mild opacification over the posterior left base which may be due to atelectasis or infection. Possible small amount of posterior right pleural fluid.  Known compression fracture over the region of the thoracolumbar junction.   Electronically Signed   By: Elberta Fortis M.D.   On: 12/28/2013 00:04   Dg Swallowing Func-speech Pathology  12/29/2013   Riley Nearing Deblois, CCC-SLP     12/29/2013 11:20 AM Objective Swallowing Evaluation: Modified Barium Swallowing Study   Patient Details  Name: Emily Peters MRN: [id number] Date of Birth: 1915-01-03  Today's Date: 12/29/2013 Time: 1030-1045 SLP Time Calculation (min): 15 min  Past Medical History:  Past Medical History  Diagnosis Date  . PAC (premature atrial contraction)   . Thyroid disease   . Hypertension   . Dementia   . Cataract   . Acid reflux   . CHF (congestive heart failure)   . Renal disorder   . Hypothyroidism   . Arthritis   . Peripheral vascular disease    Past Surgical History:  Past Surgical History  Procedure Laterality Date  . Abdominal hysterectomy    . Eye surgery    . Esophageal dilation      x 2   HPI:  Emily Peters is a 80 y.o. female with history of dementia,  fibrillation, hypothyroidism, chronic kidney disease and CHF was  brought to the ER after patient became confused hypoxic febrile.  Patient has family has been having productive cough for last 2  days time last evening patient has been having some strawberries  family. Later in the evening patient became confused febrile and  hypoxic and was brought to the ER. Chest x-ray shows features  concerning for pneumonia and also urinalysis shows possible UTI.  Patient has been placed take antibiotics admitted for further  workup. Patient  did have one episode of nausea and vomiting en  route to ER.     Assessment / Plan / Recommendation Clinical Impression  Dysphagia Diagnosis: Mild pharyngeal phase dysphagia Clinical impression: Pt demonstrates slight oral dyspahgia with  puree (brief tongue pumping), but functional except that she  separates whole pill from bolus and is edentulous. Oropharyngeal  phase characterzied by minimally delayed swallow response with  intermittent deep flash penetration of thin. No true penetration  and no aspiration observed.  Pt may initate a puree diet (no  dentition) and thin liquids with basic aspiration precautions. As  previously diagnosed pt appeared to have esophageal dysmotility  and also needs basic aspiration precautions. Pills crushed in  puree. No SLP f/u needed. Will sign off.     Treatment Recommendation  No treatment recommended at this time    Diet Recommendation Dysphagia 1 (Puree);Thin liquid   Liquid Administration via: Cup;Straw Medication Administration: Crushed with puree Supervision: Full supervision/cueing for compensatory  strategies;Staff to assist with self feeding Compensations: Slow rate;Small sips/bites;Follow solids with  liquid Postural Changes and/or Swallow Maneuvers: Seated upright 90  degrees;Upright 30-60 min after meal    Other  Recommendations Recommended Consults: MBS Oral Care Recommendations: Oral care BID   Follow Up Recommendations  Skilled Nursing facility    Frequency and Duration        Pertinent Vitals/Pain NA    SLP Swallow Goals     General HPI: Williemae Natterlsie M Faniel is a 80 y.o. female with history of  dementia, fibrillation, hypothyroidism, chronic kidney disease  and CHF was brought to the ER after patient became confused  hypoxic febrile. Patient has family has been having productive  cough for last 2 days time last evening patient has been having  some strawberries family. Later in the evening patient became  confused febrile and hypoxic and was brought to the ER. Chest  x-ray  shows features concerning for pneumonia and also urinalysis  shows possible UTI. Patient has been placed take antibiotics  admitted for further workup. Patient did have one episode of  nausea and vomiting en route to ER. Type of Study: Modified Barium Swallowing Study Reason for Referral: Objectively evaluate swallowing function Previous Swallow Assessment: Esophagram  Diet Prior to this Study: NPO Temperature Spikes Noted: No Respiratory Status: Nasal cannula History of Recent Intubation: No Behavior/Cognition: Alert;Requires cueing Oral Cavity - Dentition: Edentulous Oral Motor / Sensory Function: Within functional limits Self-Feeding Abilities: Total assist Patient Positioning: Upright in bed Baseline Vocal Quality: Low vocal intensity Volitional Cough: Cognitively unable to elicit Volitional Swallow: Unable to elicit Anatomy:  (prominent UES) Pharyngeal Secretions: Not observed secondary MBS    Reason for Referral Objectively evaluate swallowing function   Oral Phase Oral Preparation/Oral Phase Oral Phase: WFL   Pharyngeal Phase Pharyngeal Phase Pharyngeal Phase: Impaired Pharyngeal - Thin Pharyngeal - Thin Cup: Delayed swallow  initiation;Penetration/Aspiration before swallow Penetration/Aspiration details (thin cup): Material enters  airway, remains ABOVE vocal cords then ejected out;Material does  not enter airway Pharyngeal - Thin Straw: Delayed swallow  initiation;Penetration/Aspiration before swallow Penetration/Aspiration details (thin straw): Material does not  enter airway;Material enters airway, remains ABOVE vocal cords  then ejected out Pharyngeal - Solids Pharyngeal - Puree: Within functional limits  Cervical Esophageal Phase    GO    Cervical Esophageal Phase Cervical Esophageal Phase:  (Appearance of prominent CP,  esophageal sweep shows dysmotili)        Harlon DittyBonnie DeBlois, MA CCC-SLP [phone]  Riley NearingBonnie Caroline Deblois 12/29/2013, 11:20 AM      PERTINENT LAB RESULTS: CBC:  Recent Labs   12/30/13 0750  WBC 3.4*  HGB 9.8*  HCT 30.8*  PLT 162   CMET CMP     Component Value Date/Time   NA 143 12/30/2013 0750   K 3.9 12/30/2013 0750   CL 109 12/30/2013 0750   CO2 21 12/30/2013 0750   GLUCOSE 94 12/30/2013 0750   BUN 48* 12/30/2013 0750   CREATININE 1.83* 12/30/2013 0750   CALCIUM  8.2* 12/30/2013 0750   PROT 6.8 12/28/2013 0443   ALBUMIN 2.2* 12/28/2013 0443   AST 16 12/28/2013 0443   ALT 5 12/28/2013 0443   ALKPHOS 46 12/28/2013 0443   BILITOT 0.2* 12/28/2013 0443   GFRNONAA 22* 12/30/2013 0750   GFRAA 26* 12/30/2013 0750    GFR Estimated Creatinine Clearance: 14.2 ml/min (by C-G formula based on Cr of 1.83). No results found for this basename: LIPASE, AMYLASE,  in the last 72 hours No results found for this basename: CKTOTAL, CKMB, CKMBINDEX, TROPONINI,  in the last 72 hours No components found with this basename: POCBNP,  No results found for this basename: DDIMER,  in the last 72 hours No results found for this basename: HGBA1C,  in the last 72 hours No results found for this basename: CHOL, HDL, LDLCALC, TRIG, CHOLHDL, LDLDIRECT,  in the last 72 hours No results found for this basename: TSH, T4TOTAL, FREET3, T3FREE, THYROIDAB,  in the last 72 hours No results found for this basename: VITAMINB12, FOLATE, FERRITIN, TIBC, IRON, RETICCTPCT,  in the last 72 hours Coags: No results found for this basename: PT, INR,  in the last 72 hours Microbiology: Recent Results (from the past 240 hour(s))  CULTURE, BLOOD (ROUTINE X 2)     Status: None   Collection Time    12/27/13 11:48 PM      Result Value Ref Range Status   Specimen Description BLOOD LEFT ARM   Final   Special Requests BOTTLES DRAWN AEROBIC AND ANAEROBIC 10CC   Final   Culture  Setup Time     Final   Value: 12/28/2013 08:27     Performed at [facility]   Culture     Final   Value:        BLOOD CULTURE RECEIVED NO GROWTH TO DATE CULTURE WILL BE HELD FOR 5 DAYS BEFORE ISSUING A FINAL NEGATIVE REPORT      Performed at [facility]   Report Status PENDING   Incomplete  CULTURE, BLOOD (ROUTINE X 2)     Status: None   Collection Time    12/27/13 11:52 PM      Result Value Ref Range Status   Specimen Description BLOOD LEFT WRIST   Final   Special Requests BOTTLES DRAWN AEROBIC ONLY 10CC   Final   Culture  Setup Time     Final   Value: 12/28/2013 08:27     Performed at [facility]   Culture     Final   Value:        BLOOD CULTURE RECEIVED NO GROWTH TO DATE CULTURE WILL BE HELD FOR 5 DAYS BEFORE ISSUING A FINAL NEGATIVE REPORT     Performed at [facility]   Report Status PENDING   Incomplete  URINE CULTURE     Status: None   Collection Time    12/28/13 12:09 AM      Result Value Ref Range Status   Specimen Description URINE, CATHETERIZED   Final   Special Requests NONE   Final   Culture  Setup Time     Final   Value: 12/27/2013 01:45     Performed at [facility] Count     Final   Value: >=100,000 COLONIES/ML     Performed at [facility]   Culture     Final   Value: ESCHERICHIA COLI     Performed at [facility]   Report Status 12/30/2013 FINAL  Final   Organism ID, Bacteria ESCHERICHIA COLI   Final  MRSA PCR SCREENING     Status: None   Collection Time    12/29/13  1:05 AM      Result Value Ref Range Status   MRSA by PCR NEGATIVE  NEGATIVE Final   Comment:            The GeneXpert MRSA Assay (FDA     approved for NASAL specimens     only), is one component of a     comprehensive MRSA colonization     surveillance program. It is not     intended to diagnose MRSA     infection nor to guide or     monitor treatment for     MRSA infections.     BRIEF HOSPITAL COURSE:  Acute encephalopathy  -better, back to baseline. Appears alert and calm, and follows some commands  -secondary to HCAP/Asp PNA and UTI  -c/w Antibiotics on discharge  Active Problems:  HCAP/Aspiration PNA  -clinically improved  -Completed 4  days of Vancomycin/Cefepime,  Transitioned to Levaquin on discharge.  -blood culture 4/8 neg so far  -MBS done on 4/10 shows no aspiration, SNP recommending a dysphagia 1 diet with thin liquids. -Discussed with son-[name] on 4/11, who is aware of this issue,and accepts risk of aspiration. Per son, patient has been on a Dys 1 diet for some time now.   UTI  -Initially on Cefepime, transitioned to levaquin on discharge.  -Urine culture-E coli-pan sensitive   HYPOTHYROIDISM  -c/w levothyroxine   Dementia  -currently stable-suspect close to baseline-after IV Abx therapy  -c/w aricept   History of chronic kidney disease stage III-4  - Creatinine close to baseline, in fact improved than on admission. Continue to monitor periodically.   Iron deficiency  -c/w Fe supplementation   GERD  -cont PPI daily   CHF, chronic (mild systolic EF40% and grade 2 DD on Nov 2014)  -compensated  -c/w Lasix -please check lytes periodically at SNF  Atrial fibrillation  -Not on anticoagulation due to to GI bleed-on ASA  -Initially rate controlled with IV Lopressor as was NPO,now transitioned back to oral metoprolol   HTN -uncontrolled and fluctuation -added Amlodipine, c/w Lasix and Lopressor -further optimization to be done in the outpatient setting  TODAY-DAY OF DISCHARGE:  Subjective:   Nicholaus Corolla today has no headache,no chest abdominal pain,no new weakness tingling or numbness. She is pleasantly confused, and is at her usual baseline.  Objective:   Blood pressure 174/80, pulse 84, temperature 97.8 F (36.6 C), temperature source Oral, resp. rate 16, height 5\' 2"  (1.575 m), weight 54.477 kg (120 lb 1.6 oz), SpO2 98.00%.  Intake/Output Summary (Last 24 hours) at 01/01/14 1000 Last data filed at 12/31/13 1754  Gross per 24 hour  Intake    524 ml  Output      0 ml  Net    524 ml   Filed Weights   12/27/13 2241 12/28/13 1534  Weight: 57.153 kg (126 lb) 54.477 kg (120 lb 1.6 oz)     Exam Awake  pleasantly confused, No new F.N deficits, Normal affect [place].AT,PERRAL Supple Neck,No JVD, No cervical lymphadenopathy appriciated.  Symmetrical Chest wall movement, Good air movement bilaterally still with some minimal bibasilar rales-but much improved RRR,No Gallops,Rubs or new Murmurs, No Parasternal Heave +ve B.Sounds, Abd Soft, Non tender, No organomegaly appriciated, No rebound -guarding or rigidity. No Cyanosis, Clubbing or edema, No new Rash or bruise  DISCHARGE CONDITION: Stable  DISPOSITION: SNF  DISCHARGE INSTRUCTIONS:    Activity:  As tolerated with Full fall precautions use walker/cane & assistance as needed  Diet recommendation: Heart Healthy diet Dysphagia 1 Aspiration precautions:yes/No  CODE STATUS -DNR      Discharge Orders   Future Orders Complete By Expires   Diet - low sodium heart healthy  As directed    Scheduling Instructions:   Dysphagia 1 diet   Increase activity slowly  As directed       Follow-up Information   Follow up with Fredderick Severance, MD. Schedule an appointment as soon as possible for a visit in 1 week.   Specialty:  Family Medicine   Contact information:   [address] [facility] [place] [address] [phone]       Total Time spent on discharge equals 45 minutes.  Signed: Werner Lean Ghimire 01/01/2014 10:00 AM

## 2014-01-01 NOTE — Care Management Note (Signed)
    Page 1 of 1   01/01/2014     12:37:18 PM   CARE MANAGEMENT NOTE 01/01/2014  Patient:  Emily Peters, Emily Peters   Account Number:  [id number]  Date Initiated:  01/01/2014  Documentation initiated by:  Letha Cape  Subjective/Objective Assessment:   dx acute encephalopaty, pna  admit- from [facility] SNF     Action/Plan:   return to [facility]   Anticipated DC Date:  01/01/2014   Anticipated DC Plan:  SKILLED NURSING FACILITY  In-house referral  Clinical Social Worker      DC Planning Services  CM consult      Choice offered to / List presented to:             Status of service:  Completed, signed off Medicare Important Message given?   (If response is "NO", the following Medicare IM given date fields will be blank) Date Medicare IM given:   Date Additional Medicare IM given:    Discharge Disposition:  SKILLED NURSING FACILITY  Per UR Regulation:  Reviewed for med. necessity/level of care/duration of stay  If discussed at Long Length of Stay Meetings, dates discussed:    Comments:  01/01/14 1233 Letha Cape RN, BSN [phone] patient is for dc to snf today, per CSW , patient had medicare when she went to [employer] the first time and now swithced insurance to  [employer] , in which [facility] does not have a contract with [employer], but they are in the process of working out a deal for patient to come back to [place] per CSW.

## 2014-01-01 NOTE — Progress Notes (Signed)
Elyse Jarvis Suriano to be D/C'd Skilled nursing facility per MD order.  Discussed with the patient and all questions fully answered.    Medication List         acetaminophen 325 MG tablet  Commonly known as:  TYLENOL  Take 650 mg by mouth every 6 (six) hours as needed for mild pain, fever or headache.     albuterol (2.5 MG/3ML) 0.083% nebulizer solution  Commonly known as:  PROVENTIL  Take 3 mLs (2.5 mg total) by nebulization 3 (three) times daily.     amLODipine 5 MG tablet  Commonly known as:  NORVASC  Take 1 tablet (5 mg total) by mouth daily.     aspirin 81 MG tablet  Take 81 mg by mouth daily.     cholecalciferol 1000 UNITS tablet  Commonly known as:  VITAMIN D  Take 2,000 Units by mouth daily.     docusate sodium 100 MG capsule  Commonly known as:  COLACE  Take 1 capsule (100 mg total) by mouth 2 (two) times daily.     donepezil 10 MG tablet  Commonly known as:  ARICEPT  Take 10 mg by mouth at bedtime.     eucerin cream  Apply 1 application topically 3 (three) times daily as needed for dry skin.     ferrous sulfate 325 (65 FE) MG tablet  Take 1 tablet (325 mg total) by mouth daily.     furosemide 40 MG tablet  Commonly known as:  LASIX  Take 1 tablet (40 mg total) by mouth daily.     levofloxacin 500 MG tablet  Commonly known as:  LEVAQUIN  Take 1 tablet (500 mg total) by mouth every other day. For 2 more doses and then stop     levothyroxine 112 MCG tablet  Commonly known as:  SYNTHROID, LEVOTHROID  Take 112 mcg by mouth daily before breakfast.     magnesium hydroxide 400 MG/5ML suspension  Commonly known as:  MILK OF MAGNESIA  Take 30 mLs by mouth daily as needed for mild constipation.     metoprolol 50 MG tablet  Commonly known as:  LOPRESSOR  Take 50 mg by mouth 2 (two) times daily.     omeprazole 20 MG capsule  Commonly known as:  PRILOSEC  Take 40 mg by mouth daily.     polyethylene glycol packet  Commonly known as:  MIRALAX / GLYCOLAX  Take 17 g  by mouth daily.     ranitidine 150 MG tablet  Commonly known as:  ZANTAC  Take 150 mg by mouth daily.     VITAMIN B-12 IJ  Inject 1 application as directed every 30 (thirty) days.        VVS, Skin clean, dry and intact without evidence of skin break down, no evidence of skin tears noted. IV catheter discontinued intact. Site without signs and symptoms of complications. Dressing and pressure applied.  Patient escorted via EMS.     Aldean Ast 01/01/2014 5:23 PM

## 2014-01-01 NOTE — Progress Notes (Signed)
Report given to [facility].

## 2014-01-01 NOTE — Clinical Social Work Note (Signed)
Per [facility] admissions liaison, [facility] Medicare has authorized SNF placement at [place]. CSW has faxed discharge summary to [facility]. Discharge packet is complete and has been placed on pt's shadow chart. CSW has spoke with pt's son, [name], regarding pt's discharge on 01/01/2014. CSW has arranged for ambulance transportation via [facility].  RN please call report to [facility] at [phone]  Darlyn Chamber, [facility] Clinical Social Worker [phone]

## 2014-01-03 ENCOUNTER — Encounter: Payer: Self-pay | Admitting: Internal Medicine

## 2014-01-03 ENCOUNTER — Non-Acute Institutional Stay (SKILLED_NURSING_FACILITY): Payer: Medicare HMO | Admitting: Internal Medicine

## 2014-01-03 DIAGNOSIS — I509 Heart failure, unspecified: Secondary | ICD-10-CM

## 2014-01-03 DIAGNOSIS — E039 Hypothyroidism, unspecified: Secondary | ICD-10-CM

## 2014-01-03 DIAGNOSIS — I4891 Unspecified atrial fibrillation: Secondary | ICD-10-CM

## 2014-01-03 DIAGNOSIS — K219 Gastro-esophageal reflux disease without esophagitis: Secondary | ICD-10-CM

## 2014-01-03 DIAGNOSIS — D62 Acute posthemorrhagic anemia: Secondary | ICD-10-CM

## 2014-01-03 DIAGNOSIS — N390 Urinary tract infection, site not specified: Secondary | ICD-10-CM

## 2014-01-03 DIAGNOSIS — J189 Pneumonia, unspecified organism: Secondary | ICD-10-CM

## 2014-01-03 DIAGNOSIS — N184 Chronic kidney disease, stage 4 (severe): Secondary | ICD-10-CM

## 2014-01-03 DIAGNOSIS — F0390 Unspecified dementia without behavioral disturbance: Secondary | ICD-10-CM

## 2014-01-03 DIAGNOSIS — G9340 Encephalopathy, unspecified: Secondary | ICD-10-CM

## 2014-01-03 DIAGNOSIS — I10 Essential (primary) hypertension: Secondary | ICD-10-CM

## 2014-01-03 LAB — CULTURE, BLOOD (ROUTINE X 2)
CULTURE: NO GROWTH
Culture: NO GROWTH

## 2014-01-03 NOTE — Assessment & Plan Note (Signed)
Continue with iron

## 2014-01-03 NOTE — Assessment & Plan Note (Signed)
Continue levothyroxine 

## 2014-01-03 NOTE — Assessment & Plan Note (Signed)
2/2 to PNA/UTI

## 2014-01-03 NOTE — Assessment & Plan Note (Signed)
compensated 

## 2014-01-03 NOTE — Progress Notes (Signed)
MRN: [id number] Name: Emily Peters  Sex: female Age: 80 y.o. DOB: [birth year]  PSC #: [facility] Facility/Room: 105B Level Of Care: SNF Provider: Margit Hanks Emergency Contacts: Extended Emergency Contact Information Primary Emergency Contact: Allayne Stack Address: [address]          [place], [place] [address] [place] Home Phone: [phone] Mobile Phone: [phone] Relation: Son Secondary Emergency Contact: Armel,Hope Address: [address]          [place], [place] [address] [place] Home Phone: [phone] Relation: Daughter  Code Status: DNR  Allergies: Review of patient's allergies indicates no known allergies.  Chief Complaint  Patient presents with  . nursing home admission    HPI: Patient is 80 y.o. female who is admitted to SNF for OT/PT after hosp for PNA and UTI.  Past Medical History  Diagnosis Date  . PAC (premature atrial contraction)   . Thyroid disease   . Hypertension   . Dementia   . Cataract   . Acid reflux   . CHF (congestive heart failure)   . Renal disorder   . Hypothyroidism   . Arthritis   . Peripheral vascular disease     Past Surgical History  Procedure Laterality Date  . Abdominal hysterectomy    . Eye surgery    . Esophageal dilation      x 2      Medication List       This list is accurate as of: 01/03/14 10:00 PM.  Always use your most recent med list.               acetaminophen 325 MG tablet  Commonly known as:  TYLENOL  Take 650 mg by mouth every 6 (six) hours as needed for mild pain, fever or headache.     albuterol (2.5 MG/3ML) 0.083% nebulizer solution  Commonly known as:  PROVENTIL  Take 3 mLs (2.5 mg total) by nebulization 3 (three) times daily.     amLODipine 5 MG tablet  Commonly known as:  NORVASC  Take 1 tablet (5 mg total) by mouth daily.     aspirin 81 MG tablet  Take 81 mg by mouth daily.     cholecalciferol 1000 UNITS tablet  Commonly known as:  VITAMIN D   Take 2,000 Units by mouth daily.     docusate sodium 100 MG capsule  Commonly known as:  COLACE  Take 1 capsule (100 mg total) by mouth 2 (two) times daily.     donepezil 10 MG tablet  Commonly known as:  ARICEPT  Take 10 mg by mouth at bedtime.     eucerin cream  Apply 1 application topically 3 (three) times daily as needed for dry skin.     ferrous sulfate 325 (65 FE) MG tablet  Take 1 tablet (325 mg total) by mouth daily.     furosemide 40 MG tablet  Commonly known as:  LASIX  Take 1 tablet (40 mg total) by mouth daily.     levofloxacin 500 MG tablet  Commonly known as:  LEVAQUIN  Take 1 tablet (500 mg total) by mouth every other day. For 2 more doses and then stop     levothyroxine 112 MCG tablet  Commonly known as:  SYNTHROID, LEVOTHROID  Take 112 mcg by mouth daily before breakfast.     magnesium hydroxide 400 MG/5ML suspension  Commonly known as:  MILK OF MAGNESIA  Take 30 mLs by mouth daily as needed  for mild constipation.     metoprolol 50 MG tablet  Commonly known as:  LOPRESSOR  Take 50 mg by mouth 2 (two) times daily.     omeprazole 20 MG capsule  Commonly known as:  PRILOSEC  Take 40 mg by mouth daily.     polyethylene glycol packet  Commonly known as:  MIRALAX / GLYCOLAX  Take 17 g by mouth daily.     ranitidine 150 MG tablet  Commonly known as:  ZANTAC  Take 150 mg by mouth daily.     VITAMIN B-12 IJ  Inject 1 application as directed every 30 (thirty) days.        No orders of the defined types were placed in this encounter.    Immunization History  Administered Date(s) Administered  . Influenza Whole 06/17/2009  . Influenza-Unspecified 06/19/2013  . Pneumococcal Polysaccharide-23 01/28/2009  . Td 01/28/2009  . Tdap 06/02/2012    History  Substance Use Topics  . Smoking status: Never Smoker   . Smokeless tobacco: Never Used  . Alcohol Use: No    Family history is noncontributory    Review of Systems  DATA OBTAINED: from  patient; pt has no c/o. But also dementia GENERAL: Feels well no fevers, fatigue, appetite changes SKIN: No itching, rash or wounds EYES: No eye pain, redness, discharge EARS: No earache, tinnitus, change in hearing NOSE: No congestion, drainage or bleeding  MOUTH/THROAT: No mouth or tooth pain, No sore throat RESPIRATORY: No cough, wheezing, SOB CARDIAC: No chest pain, palpitations, lower extremity edema  GI: No abdominal pain, No N/V/D or constipation, No heartburn or reflux  GU: No dysuria, frequency or urgency, or incontinence  MUSCULOSKELETAL: No unrelieved bone/joint pain NEUROLOGIC: No headache, dizziness or focal weakness PSYCHIATRIC: No overt anxiety or sadness. Sleeps well. No behavior issue.   Filed Vitals:   01/03/14 2045  BP: 132/78  Pulse: 85  Temp: 98.8 F (37.1 C)  Resp: 18    Physical Exam  GENERAL APPEARANCE: Alert, minconversant. Appropriately groomed. No acute distress.  SKIN: No diaphoresis rash, HEAD: Normocephalic, atraumatic  EYES: Conjunctiva/lids clear. Pupils round, reactive. EOMs intact.  EARS: External exam WNL, canals clear. Hearing grossly normal.  NOSE: No deformity or discharge.  MOUTH/THROAT: Lips w/o lesions  RESPIRATORY: Breathing is even, unlabored. Lung sounds are clear   CARDIOVASCULAR: Heart RRR no murmurs, rubs or gallops. No peripheral edema.  GASTROINTESTINAL: Abdomen is soft, non-tender, not distended w/ normal bowel sounds GENITOURINARY: Bladder non tender, not distended  MUSCULOSKELETAL: No abnormal joints or musculature NEUROLOGIC:  Cranial nerves 2-12 grossly intact. Moves all extremities no tremor. PSYCHIATRIC: pleasant dementia, no behavioral issues  Patient Active Problem List   Diagnosis Date Noted  . HCAP (healthcare-associated pneumonia) 12/28/2013  . UTI (lower urinary tract infection) 12/28/2013  . Acute encephalopathy 12/28/2013  . Pneumonia 12/28/2013  . Atrial fibrillation 08/14/2013  . CKD (chronic kidney  disease) stage 4, GFR 15-29 ml/min 08/08/2013  . Rectal bleed 08/07/2013  . Acute blood loss anemia 08/07/2013  . Failure to thrive 07/27/2013  . CHF, chronic (mild systolic EF40% and grade 2 DD) 07/27/2013  . Right hip pain 07/27/2013  . Peripheral vascular disease 07/27/2013  . RIB PAIN, RIGHT SIDED 08/13/2009  . HYPERTENSION 06/17/2009  . OSTEOPOROSIS 04/14/2009  . OTHER DYSPHAGIA 04/14/2009  . SPINAL STENOSIS, LUMBAR 03/29/2009  . B12 DEFICIENCY 03/15/2009  . ANEMIA, PERNICIOUS 03/15/2009  . INCONTINENCE 03/15/2009  . HYPOTHYROIDISM 02/11/2009  . Dementia without behavioral disturbance 02/11/2009  . ESOPHAGEAL  STRICTURE 02/11/2009  . GERD 02/11/2009  . ABNORMALITY OF GAIT 02/11/2009    CBC    Component Value Date/Time   WBC 3.4* 12/30/2013 0750   RBC 3.03* 12/30/2013 0750   RBC 3.01* 08/08/2013 1010   HGB 9.8* 12/30/2013 0750   HCT 30.8* 12/30/2013 0750   PLT 162 12/30/2013 0750   MCV 101.7* 12/30/2013 0750   LYMPHSABS 1.0 12/28/2013 0443   MONOABS 0.8 12/28/2013 0443   EOSABS 0.0 12/28/2013 0443   BASOSABS 0.0 12/28/2013 0443    CMP     Component Value Date/Time   NA 143 12/30/2013 0750   K 3.9 12/30/2013 0750   CL 109 12/30/2013 0750   CO2 21 12/30/2013 0750   GLUCOSE 94 12/30/2013 0750   BUN 48* 12/30/2013 0750   CREATININE 1.83* 12/30/2013 0750   CALCIUM 8.2* 12/30/2013 0750   PROT 6.8 12/28/2013 0443   ALBUMIN 2.2* 12/28/2013 0443   AST 16 12/28/2013 0443   ALT 5 12/28/2013 0443   ALKPHOS 46 12/28/2013 0443   BILITOT 0.2* 12/28/2013 0443   GFRNONAA 22* 12/30/2013 0750   GFRAA 26* 12/30/2013 0750    Assessment and Plan  Acute encephalopathy 2/2 to PNA/UTI  HCAP (healthcare-associated pneumonia) linically improved  -Completed 4 days of Vancomycin/Cefepime, Transitioned to Levaquin on discharge.  -blood culture 4/8 neg so far  -MBS done on 4/10 shows no aspiration, SNP recommending a dysphagia 1 diet with thin liquids. -Discussed with son-[name] on 4/11, who is aware of this  issue,and accepts risk of aspiration. Per son, patient has been on a Dys 1 diet for some time now  Continue nebs  UTI (lower urinary tract infection) E Coli , Pan sens;cipro -now levaquen QOD 2 more doses-stop date 4/17  CKD (chronic kidney disease) stage 4, GFR 15-29 ml/min Cr back to close to baseline  HYPOTHYROIDISM  Continue levothyroxine  Dementia without behavioral disturbance baselne in d/c;c/w aricept  GERD Cont omeprazole 40 mg daily  CHF, chronic (mild systolic EF40% and grade 2 DD) compensated  Acute blood loss anemia Continue with iron  Atrial fibrillation Not on coag 2/2 GI bleed-on ASA  HYPERTENSION In hosp for control Norvasc was added to lasix and lopressor    Margit Hanks, MD

## 2014-01-03 NOTE — Assessment & Plan Note (Signed)
In hosp for control Norvasc was added to lasix and lopressor

## 2014-01-03 NOTE — Assessment & Plan Note (Signed)
E Coli , Pan sens;cipro -now levaquen QOD 2 more doses-stop date 4/17

## 2014-01-03 NOTE — Assessment & Plan Note (Signed)
Cont omeprazole 40 mg daily

## 2014-01-03 NOTE — Assessment & Plan Note (Signed)
Not on coag 2/2 GI bleed-on ASA

## 2014-01-03 NOTE — Assessment & Plan Note (Signed)
linically improved  -Completed 4 days of Vancomycin/Cefepime, Transitioned to Levaquin on discharge.  -blood culture 4/8 neg so far  -MBS done on 4/10 shows no aspiration, SNP recommending a dysphagia 1 diet with thin liquids. -Discussed with son-[name] on 4/11, who is aware of this issue,and accepts risk of aspiration. Per son, patient has been on a Dys 1 diet for some time now  Continue nebs

## 2014-01-03 NOTE — Assessment & Plan Note (Signed)
Cr back to close to baseline

## 2014-01-03 NOTE — Assessment & Plan Note (Signed)
baselne in d/c;c/w aricept

## 2014-02-06 ENCOUNTER — Non-Acute Institutional Stay (SKILLED_NURSING_FACILITY): Payer: Medicare Other | Admitting: Nurse Practitioner

## 2014-02-06 DIAGNOSIS — E039 Hypothyroidism, unspecified: Secondary | ICD-10-CM

## 2014-02-06 DIAGNOSIS — I509 Heart failure, unspecified: Secondary | ICD-10-CM

## 2014-02-06 DIAGNOSIS — F0390 Unspecified dementia without behavioral disturbance: Secondary | ICD-10-CM

## 2014-02-06 DIAGNOSIS — K219 Gastro-esophageal reflux disease without esophagitis: Secondary | ICD-10-CM

## 2014-02-06 NOTE — Progress Notes (Signed)
Patient ID: Emily Peters, female   DOB: 03-Feb-1915, 80 y.o.   MRN: [id number]    Nursing Home Location:  [facility] of Service: SNF (31)  PCP: Kennon Holter, MD  No Known Allergies  Chief Complaint  Patient presents with  . Medical Management of Chronic Issues    HPI:  Patient is a long term resident of the facility who was seen today for routine management of chronic conditions. Pt with pmh of  Atrial fibrillation, CHF, HTN, PVD, GERD, Esophageal Stricture, Hypothyroidism, Dementia, Osteoporosis, CKD, and anemia. She is a poor historian, but has no current complaints and the nursing staff has no new concerns since she has returned from the hospital due to PNA and UTI.  Review of Systems:  Unable to obtain due to dementia   Past Medical History  Diagnosis Date  . PAC (premature atrial contraction)   . Thyroid disease   . Hypertension   . Dementia   . Cataract   . Acid reflux   . CHF (congestive heart failure)   . Renal disorder   . Hypothyroidism   . Arthritis   . Peripheral vascular disease    Past Surgical History  Procedure Laterality Date  . Abdominal hysterectomy    . Eye surgery    . Esophageal dilation      x 2   Social History:   reports that she has never smoked. She has never used smokeless tobacco. She reports that she does not drink alcohol or use illicit drugs.  Family History  Problem Relation Age of Onset  . Heart failure    . Heart attack      Medications: Patient's Medications  New Prescriptions   No medications on file  Previous Medications   ACETAMINOPHEN (TYLENOL) 325 MG TABLET    Take 650 mg by mouth every 6 (six) hours as needed for mild pain, fever or headache.   ALBUTEROL (PROVENTIL) (2.5 MG/3ML) 0.083% NEBULIZER SOLUTION    Take 3 mLs (2.5 mg total) by nebulization 3 (three) times daily.   AMLODIPINE (NORVASC) 5 MG TABLET    Take 1 tablet (5 mg total) by mouth daily.   ASPIRIN 81 MG TABLET    Take 81 mg by mouth  daily.   CHOLECALCIFEROL (VITAMIN D) 1000 UNITS TABLET    Take 2,000 Units by mouth daily.    CYANOCOBALAMIN (VITAMIN B-12 IJ)    Inject 1 application as directed every 30 (thirty) days.    DOCUSATE SODIUM (COLACE) 100 MG CAPSULE    Take 1 capsule (100 mg total) by mouth 2 (two) times daily.   DONEPEZIL (ARICEPT) 10 MG TABLET    Take 10 mg by mouth at bedtime.   FERROUS SULFATE 325 (65 FE) MG TABLET    Take 1 tablet (325 mg total) by mouth daily.   FUROSEMIDE (LASIX) 40 MG TABLET    Take 1 tablet (40 mg total) by mouth daily.   LEVOTHYROXINE (SYNTHROID, LEVOTHROID) 125 MCG TABLET    Take 125 mcg by mouth daily before breakfast.   MAGNESIUM HYDROXIDE (MILK OF MAGNESIA) 400 MG/5ML SUSPENSION    Take 30 mLs by mouth daily as needed for mild constipation.   METOPROLOL (LOPRESSOR) 50 MG TABLET    Take 50 mg by mouth 2 (two) times daily.   OMEPRAZOLE (PRILOSEC) 20 MG CAPSULE    Take 40 mg by mouth daily.   POLYETHYLENE GLYCOL (MIRALAX / GLYCOLAX) PACKET    Take 17 g  by mouth daily.   RANITIDINE (ZANTAC) 150 MG TABLET    Take 150 mg by mouth daily.   SKIN PROTECTANTS, MISC. (EUCERIN) CREAM    Apply 1 application topically 3 (three) times daily as needed for dry skin.   Modified Medications   No medications on file  Discontinued Medications   LEVOFLOXACIN (LEVAQUIN) 500 MG TABLET    Take 1 tablet (500 mg total) by mouth every other day. For 2 more doses and then stop   LEVOTHYROXINE (SYNTHROID, LEVOTHROID) 112 MCG TABLET    Take 112 mcg by mouth daily before breakfast.     Physical Exam:  Filed Vitals:   02/06/14 1338  BP: 114/70  Pulse: 72  Temp: 97 F (36.1 C)  Resp: 20  Weight: 127 lb (57.607 kg)   Physical Exam  Nursing note and vitals reviewed. Constitutional: No distress.  HENT:  Head: Normocephalic and atraumatic.  Eyes: Pupils are equal, round, and reactive to light. Right eye exhibits no discharge. Left eye exhibits no discharge.  Neck: Normal range of motion. Neck supple.    Cardiovascular: Normal rate and intact distal pulses.   Pulmonary/Chest: Effort normal and breath sounds normal. No respiratory distress. She has no wheezes. She exhibits no tenderness.  Abdominal: Soft. Bowel sounds are normal. She exhibits no distension and no mass. There is no tenderness. There is no rebound and no guarding.  Musculoskeletal: She exhibits no edema and no tenderness.  Neurological: She is alert.  Skin: Skin is warm and dry. No rash noted. She is not diaphoretic. There is pallor.  Psychiatric: Mood normal. She exhibits abnormal new learning ability, abnormal recent memory and abnormal remote memory.      Labs reviewed: Basic Metabolic Panel:  Recent Labs  12/27/13 2311 12/28/13 0443 12/30/13 0750  NA 140 143 143  K 4.1 3.8 3.9  CL 102 107 109  CO2 $Re'23 23 21  'TWo$ GLUCOSE 131* 107* 94  BUN 58* 57* 48*  CREATININE 2.30* 2.35* 1.83*  CALCIUM 8.9 8.1* 8.2*   Liver Function Tests:  Recent Labs  08/08/13 0740 12/27/13 2311 12/28/13 0443  AST $Re'17 25 16  'bjm$ ALT $R'7 5 5  'uc$ ALKPHOS 65 54 46  BILITOT 0.8 0.3 0.2*  PROT 5.2* 7.9 6.8  ALBUMIN 1.5* 2.6* 2.2*    Recent Labs  07/27/13 1130  LIPASE 68*   No results found for this basename: AMMONIA,  in the last 8760 hours CBC:  Recent Labs  07/27/13 1130  08/07/13 1617  12/27/13 2311 12/28/13 0443 12/30/13 0750  WBC 3.7*  < > 6.2  < > 5.4 4.6 3.4*  NEUTROABS 2.5  --  4.6  --   --  2.9  --   HGB 9.4*  < > 8.5*  < > 11.0* 10.0* 9.8*  HCT 29.2*  < > 27.2*  < > 33.9* 31.3* 30.8*  MCV 99.3  < > 100.4*  < > 100.9* 100.6* 101.7*  PLT 207  < > 189  < > 180 174 162  < > = values in this interval not displayed. Cardiac Enzymes:  Recent Labs  07/27/13 1130 12/28/13 0010  CKTOTAL 35  --   TROPONINI  --  <0.30   BNP: No components found with this basename: POCBNP,  CBG:  Recent Labs  08/07/13 2020  GLUCAP 80   TSH:  Recent Labs  07/27/13 2254  TSH 5.006*   TSH, Ultrasensitive    Result: 01/02/2014  2:47 PM   ( Status: F )  TSH 20.375   H 0.350-4.500 uIU/mL SLN   CBC NO Diff (Complete Blood Count)    Result: 01/10/2014 3:34 PM   ( Status: F )       WBC 4.1     4.0-10.5 K/uL SLN   RBC 3.31   L 3.87-5.11 MIL/uL SLN   Hemoglobin 10.5   L 12.0-15.0 g/dL SLN   Hematocrit 31.4   L 36.0-46.0 % SLN   MCV 94.9     78.0-100.0 fL SLN   MCH 31.7     26.0-34.0 pg SLN   MCHC 33.4     30.0-36.0 g/dL SLN   RDW 14.4     11.5-15.5 % SLN   Platelet Count 229     150-400 K/uL SLN   Basic Metabolic Panel    Result: 01/10/2014 3:56 PM   ( Status: F )       Sodium 139     135-145 mEq/L SLN   Potassium 4.2     3.5-5.3 mEq/L SLN   Chloride 105     96-112 mEq/L SLN   CO2 26     19-32 mEq/L SLN   Glucose 88     70-99 mg/dL SLN   BUN 39   H 6-23 mg/dL SLN   Creatinine 1.59   H 0.50-1.10 mg/dL SLN   Calcium 8.3   L 8.4-10.5 mg/dL SL CBC with Diff    Result: 01/29/2014 8:59 PM   ( Status: F )     C WBC 3.4   L 4.0-10.5 K/uL SLN   RBC 3.50   L 3.87-5.11 MIL/uL SLN   Hemoglobin 11.1   L 12.0-15.0 g/dL SLN   Hematocrit 33.1   L 36.0-46.0 % SLN   MCV 94.6     78.0-100.0 fL SLN   MCH 31.7     26.0-34.0 pg SLN   MCHC 33.5     30.0-36.0 g/dL SLN   RDW 14.4     11.5-15.5 % SLN   Platelet Count 178     150-400 K/uL SLN   Granulocyte % 50     43-77 % SLN   Absolute Gran 1.7     1.7-7.7 K/uL SLN   Lymph % 35     12-46 % SLN   Absolute Lymph 1.2     0.7-4.0 K/uL SLN   Mono % 13   H 3-12 % SLN   Absolute Mono 0.4     0.1-1.0 K/uL SLN   Eos % 2     0-5 % SLN   Absolute Eos 0.1     0.0-0.7 K/uL SLN   Baso % 0     0-1 % SLN   Absolute Baso 0.0     0.0-0.1 K/uL SLN   Smear Review Criteria for review not met Assessment/Plan 1. CHF, chronic (mild systolic OY77% and grade 2 DD) -without worsening of symptoms, conts on lasix, betablocker   2. GERD -stabe on zantac and omeprazole   3. HYPOTHYROIDISM -synthroid was increased to 125 mcg, follow up TSH scheduled   4. Dementia without behavioral  disturbance Without significant changes, does well in current environment  5. Anemia -improved on recent labs -conts iron

## 2014-03-16 ENCOUNTER — Non-Acute Institutional Stay (SKILLED_NURSING_FACILITY): Payer: Medicare Other | Admitting: Nurse Practitioner

## 2014-03-16 DIAGNOSIS — D62 Acute posthemorrhagic anemia: Secondary | ICD-10-CM

## 2014-03-16 DIAGNOSIS — F0390 Unspecified dementia without behavioral disturbance: Secondary | ICD-10-CM

## 2014-03-16 DIAGNOSIS — I10 Essential (primary) hypertension: Secondary | ICD-10-CM

## 2014-03-16 DIAGNOSIS — E039 Hypothyroidism, unspecified: Secondary | ICD-10-CM

## 2014-03-16 DIAGNOSIS — N184 Chronic kidney disease, stage 4 (severe): Secondary | ICD-10-CM

## 2014-03-16 NOTE — Progress Notes (Signed)
Patient ID: Emily Peters, female   DOB: 09/25/1914, 80 y.o.   MRN: [id number]    Nursing Home Location:  [place] of Service: SNF (31)  PCP: Kennon Holter, MD  No Known Allergies  Chief Complaint  Patient presents with  . Medical Management of Chronic Issues    HPI:  Patient is a 80 y.o. female with pmh of Atrial fibrillation, CHF, HTN, PVD, GERD, Esophageal Stricture, Hypothyroidism, Dementia, Osteoporosis, CKD, and anemia who is a long term resident of the facility and is being seen today for routine management of chronic conditions.there has been no significant changes in the last month. Staff without concerns, reporting good PO intake, BMs, without mention of pain or acute changes in cogitation    Review of Systems:  Unable to obtain due to dementia  Past Medical History  Diagnosis Date  . PAC (premature atrial contraction)   . Thyroid disease   . Hypertension   . Dementia   . Cataract   . Acid reflux   . CHF (congestive heart failure)   . Renal disorder   . Hypothyroidism   . Arthritis   . Peripheral vascular disease    Past Surgical History  Procedure Laterality Date  . Abdominal hysterectomy    . Eye surgery    . Esophageal dilation      x 2   Social History:   reports that she has never smoked. She has never used smokeless tobacco. She reports that she does not drink alcohol or use illicit drugs.  Family History  Problem Relation Age of Onset  . Heart failure    . Heart attack      Medications: Patient's Medications  New Prescriptions   No medications on file  Previous Medications   ACETAMINOPHEN (TYLENOL) 325 MG TABLET    Take 650 mg by mouth every 6 (six) hours as needed for mild pain, fever or headache.   ALBUTEROL (PROVENTIL) (2.5 MG/3ML) 0.083% NEBULIZER SOLUTION    Take 3 mLs (2.5 mg total) by nebulization 3 (three) times daily.   AMLODIPINE (NORVASC) 5 MG TABLET    Take 1 tablet (5 mg total) by mouth daily.   ASPIRIN  81 MG TABLET    Take 81 mg by mouth daily.   CHOLECALCIFEROL (VITAMIN D) 1000 UNITS TABLET    Take 2,000 Units by mouth daily.    CYANOCOBALAMIN (VITAMIN B-12 IJ)    Inject 1 application as directed every 30 (thirty) days.    DOCUSATE SODIUM (COLACE) 100 MG CAPSULE    Take 1 capsule (100 mg total) by mouth 2 (two) times daily.   DONEPEZIL (ARICEPT) 10 MG TABLET    Take 10 mg by mouth at bedtime.   FERROUS SULFATE 325 (65 FE) MG TABLET    Take 1 tablet (325 mg total) by mouth daily.   FUROSEMIDE (LASIX) 40 MG TABLET    Take 1 tablet (40 mg total) by mouth daily.   LEVOTHYROXINE (SYNTHROID, LEVOTHROID) 125 MCG TABLET    Take 125 mcg by mouth daily before breakfast.   MAGNESIUM HYDROXIDE (MILK OF MAGNESIA) 400 MG/5ML SUSPENSION    Take 30 mLs by mouth daily as needed for mild constipation.   METOPROLOL (LOPRESSOR) 50 MG TABLET    Take 50 mg by mouth 2 (two) times daily.   OMEPRAZOLE (PRILOSEC) 20 MG CAPSULE    Take 40 mg by mouth daily.   POLYETHYLENE GLYCOL (MIRALAX / GLYCOLAX) PACKET    Take  17 g by mouth daily.   RANITIDINE (ZANTAC) 150 MG TABLET    Take 150 mg by mouth daily.   SKIN PROTECTANTS, MISC. (EUCERIN) CREAM    Apply 1 application topically 3 (three) times daily as needed for dry skin.   Modified Medications   No medications on file  Discontinued Medications   No medications on file     Physical Exam: Physical Exam  Constitutional: She appears well-developed and well-nourished. No distress.  HENT:  Mouth/Throat: Oropharynx is clear and moist. No oropharyngeal exudate.  Eyes: Conjunctivae and EOM are normal. Pupils are equal, round, and reactive to light.  Neck: Normal range of motion. Neck supple.  Cardiovascular: Normal rate, regular rhythm and normal heart sounds.   Pulmonary/Chest: Effort normal and breath sounds normal.  Abdominal: Soft. Bowel sounds are normal.  Musculoskeletal: She exhibits no edema and no tenderness.  Self propels in [facility]  Skin: Skin is warm and dry.  She is not diaphoretic.  Psychiatric: She exhibits abnormal recent memory.     Filed Vitals:   03/16/14 1731  BP: 122/64  Pulse: 66  Temp: 98.1 F (36.7 C)  Resp: 18      Labs reviewed: Basic Metabolic Panel:  Recent Labs  12/27/13 2311 12/28/13 0443 12/30/13 0750  NA 140 143 143  K 4.1 3.8 3.9  CL 102 107 109  CO2 _0 GLUCOSE 131* 107* 94  BUN 58* 57* 48*  CREATININE 2.30* 2.35* 1.83*  CALCIUM 8.9 8.1* 8.2*   Liver Function Tests:  Recent Labs  08/08/13 0740 12/27/13 2311 12/28/13 0443  AST _1 ALT _2 ALKPHOS 65 54 46  BILITOT 0.8 0.3 0.2*  PROT 5.2* 7.9 6.8  ALBUMIN 1.5* 2.6* 2.2*    Recent Labs  07/27/13 1130  LIPASE 68*   No results found for this basename: AMMONIA,  in the last 8760 hours CBC:  Recent Labs  07/27/13 1130  08/07/13 1617  12/27/13 2311 12/28/13 0443 12/30/13 0750  WBC 3.7*  < > 6.2  < > 5.4 4.6 3.4*  NEUTROABS 2.5  --  4.6  --   --  2.9  --   HGB 9.4*  < > 8.5*  < > 11.0* 10.0* 9.8*  HCT 29.2*  < > 27.2*  < > 33.9* 31.3* 30.8*  MCV 99.3  < > 100.4*  < > 100.9* 100.6* 101.7*  PLT 207  < > 189  < > 180 174 162  < > = values in this interval not displayed. Cardiac Enzymes:  Recent Labs  07/27/13 1130 12/28/13 0010  CKTOTAL 35  --   TROPONINI  --  <0.30   BNP: No components found with this basename: POCBNP,  CBG:  Recent Labs  08/07/13 2020  GLUCAP 80   TSH:  Recent Labs  07/27/13 2254  TSH 5.006*   Result: 01/02/2014 2:47 PM ( Status: F )  TSH 20.375 H 0.350-4.500 uIU/mL SLN  CBC NO Diff (Complete Blood Count)  Result: 01/10/2014 3:34 PM ( Status: F )  WBC 4.1 4.0-10.5 K/uL SLN  RBC 3.31 L 3.87-5.11 MIL/uL SLN  Hemoglobin 10.5 L 12.0-15.0 g/dL SLN  Hematocrit 31.4 L 36.0-46.0 % SLN  MCV 94.9 78.0-100.0 fL SLN  MCH 31.7 26.0-34.0 pg SLN  MCHC 33.4 30.0-36.0 g/dL SLN  RDW 14.4 11.5-15.5 % SLN  Platelet Count 229 150-400 K/uL SLN  Basic Metabolic Panel  Result: 12/28/1446 3:56 PM (  Status: F )  Sodium 139 135-145 mEq/L SLN  Potassium 4.2 3.5-5.3 mEq/L SLN  Chloride 105 96-112 mEq/L SLN  CO2 26 19-32 mEq/L SLN  Glucose 88 70-99 mg/dL SLN  BUN 39 H 6-23 mg/dL SLN  Creatinine 1.59 H 0.50-1.10 mg/dL SLN  Calcium 8.3 L 8.4-10.5 mg/dL SL  CBC with Diff  Result: 01/29/2014 8:59 PM ( Status: F ) C  WBC 3.4 L 4.0-10.5 K/uL SLN  RBC 3.50 L 3.87-5.11 MIL/uL SLN  Hemoglobin 11.1 L 12.0-15.0 g/dL SLN  Hematocrit 33.1 L 36.0-46.0 % SLN  MCV 94.6 78.0-100.0 fL SLN  MCH 31.7 26.0-34.0 pg SLN  MCHC 33.5 30.0-36.0 g/dL SLN  RDW 14.4 11.5-15.5 % SLN  Platelet Count 178 150-400 K/uL SLN  Granulocyte % 50 43-77 % SLN  Absolute Gran 1.7 1.7-7.7 K/uL SLN  Lymph % 35 12-46 % SLN  Absolute Lymph 1.2 0.7-4.0 K/uL SLN  Mono % 13 H 3-12 % SLN  Absolute Mono 0.4 0.1-1.0 K/uL SLN  Eos % 2 0-5 % SLN  Absolute Eos 0.1 0.0-0.7 K/uL SLN  Baso % 0 0-1 % SLN  Absolute Baso 0.0 0.0-0.1 K/uL SLN  Smear Review Criteria for review not met  Assessment/Plan   Assessment/Plan 1. HYPOTHYROIDISM -recent elevation in TSH therefore synthroid was adjusted follow up labs ordered but do not appear to have been drawn, will reorder at this time  2. CKD (chronic kidney disease) stage 4, GFR 15-29 ml/min -will follow up renal function, cmp  3. Acute blood loss anemia -from hx of GI bleed, no recent bleeding noted, conts on iron, cbc have been stable  4. Dementia without behavioral disturbance -stable in current environment, conts on aricept   5. HYPERTENSION -stable on norvasc and lopressor

## 2014-04-06 ENCOUNTER — Non-Acute Institutional Stay (SKILLED_NURSING_FACILITY): Payer: Medicare Other | Admitting: Nurse Practitioner

## 2014-04-06 DIAGNOSIS — I10 Essential (primary) hypertension: Secondary | ICD-10-CM

## 2014-04-06 DIAGNOSIS — D62 Acute posthemorrhagic anemia: Secondary | ICD-10-CM

## 2014-04-06 DIAGNOSIS — E039 Hypothyroidism, unspecified: Secondary | ICD-10-CM

## 2014-04-06 DIAGNOSIS — N184 Chronic kidney disease, stage 4 (severe): Secondary | ICD-10-CM

## 2014-04-06 DIAGNOSIS — F0390 Unspecified dementia without behavioral disturbance: Secondary | ICD-10-CM

## 2014-04-06 NOTE — Progress Notes (Signed)
Patient ID: Emily Peters, female   DOB: [birth year], 80 y.o.   MRN: [id number]    Nursing Home Location:  [place] of Service: SNF (31)  PCP: Kennon Holter, MD  No Known Allergies  Chief Complaint  Patient presents with  . Medical Management of Chronic Issues    HPI:  Patient is a 80 y.o. female with pmh of Atrial fibrillation, CHF, HTN, PVD, GERD, Esophageal Stricture, Hypothyroidism, Dementia, Osteoporosis, CKD, and anemia who is a long term resident of the facility and is being seen today for routine management of chronic conditions.there has been no significant changes in the last month. Pt conts to walk with restorative. Staff without concerns, reporting good PO intake, BMs, without mention of pain or acute changes in cogitation. Labs also reviewed.    Review of Systems:  Unable to obtain pt is a poor historian   Past Medical History  Diagnosis Date  . PAC (premature atrial contraction)   . Thyroid disease   . Hypertension   . Dementia   . Cataract   . Acid reflux   . CHF (congestive heart failure)   . Renal disorder   . Hypothyroidism   . Arthritis   . Peripheral vascular disease    Past Surgical History  Procedure Laterality Date  . Abdominal hysterectomy    . Eye surgery    . Esophageal dilation      x 2   Social History:   reports that she has never smoked. She has never used smokeless tobacco. She reports that she does not drink alcohol or use illicit drugs.  Family History  Problem Relation Age of Onset  . Heart failure    . Heart attack      Medications: Patient's Medications  New Prescriptions   No medications on file  Previous Medications   ACETAMINOPHEN (TYLENOL) 325 MG TABLET    Take 650 mg by mouth every 6 (six) hours as needed for mild pain, fever or headache.   ALBUTEROL (PROVENTIL) (2.5 MG/3ML) 0.083% NEBULIZER SOLUTION    Take 3 mLs (2.5 mg total) by nebulization 3 (three) times daily.   AMLODIPINE (NORVASC) 5 MG  TABLET    Take 1 tablet (5 mg total) by mouth daily.   ASPIRIN 81 MG TABLET    Take 81 mg by mouth daily.   CHOLECALCIFEROL (VITAMIN D) 1000 UNITS TABLET    Take 2,000 Units by mouth daily.    CYANOCOBALAMIN (VITAMIN B-12 IJ)    Inject 1 application as directed every 30 (thirty) days.    DOCUSATE SODIUM (COLACE) 100 MG CAPSULE    Take 1 capsule (100 mg total) by mouth 2 (two) times daily.   DONEPEZIL (ARICEPT) 10 MG TABLET    Take 10 mg by mouth at bedtime.   FERROUS SULFATE 325 (65 FE) MG TABLET    Take 1 tablet (325 mg total) by mouth daily.   FUROSEMIDE (LASIX) 40 MG TABLET    Take 1 tablet (40 mg total) by mouth daily.   LEVOTHYROXINE (SYNTHROID, LEVOTHROID) 125 MCG TABLET    Take 125 mcg by mouth daily before breakfast.   MAGNESIUM HYDROXIDE (MILK OF MAGNESIA) 400 MG/5ML SUSPENSION    Take 30 mLs by mouth daily as needed for mild constipation.   METOPROLOL (LOPRESSOR) 50 MG TABLET    Take 50 mg by mouth 2 (two) times daily.   OMEPRAZOLE (PRILOSEC) 20 MG CAPSULE    Take 40 mg by mouth daily.  POLYETHYLENE GLYCOL (MIRALAX / GLYCOLAX) PACKET    Take 17 g by mouth daily.   RANITIDINE (ZANTAC) 150 MG TABLET    Take 150 mg by mouth daily.   SKIN PROTECTANTS, MISC. (EUCERIN) CREAM    Apply 1 application topically 3 (three) times daily as needed for dry skin.   Modified Medications   No medications on file  Discontinued Medications   No medications on file     Physical Exam: Physical Exam  Nursing note and vitals reviewed. Constitutional: No distress.  HENT:  Head: Normocephalic and atraumatic.  Mouth/Throat: Oropharynx is clear and moist. No oropharyngeal exudate.  Eyes: Pupils are equal, round, and reactive to light. Right eye exhibits no discharge. Left eye exhibits no discharge.  Neck: Normal range of motion. Neck supple.  Cardiovascular: Normal rate and intact distal pulses.   Pulmonary/Chest: Effort normal and breath sounds normal. No respiratory distress.  Abdominal: Soft. Bowel  sounds are normal.  Musculoskeletal: She exhibits no edema and no tenderness.  Neurological: She is alert.  Skin: Skin is warm and dry. No rash noted. She is not diaphoretic.  Psychiatric: Mood normal. She exhibits abnormal new learning ability, abnormal recent memory and abnormal remote memory.     Filed Vitals:   04/06/14 1248  BP: 123/77  Pulse: 79  Temp: 97.4 F (36.3 C)  Resp: 20  Weight: 126 lb (57.153 kg)      Labs reviewed: Basic Metabolic Panel:  Recent Labs  12/27/13 2311 12/28/13 0443 12/30/13 0750  NA 140 143 143  K 4.1 3.8 3.9  CL 102 107 109  CO2 $Re'23 23 21  'vDQ$ GLUCOSE 131* 107* 94  BUN 58* 57* 48*  CREATININE 2.30* 2.35* 1.83*  CALCIUM 8.9 8.1* 8.2*   Liver Function Tests:  Recent Labs  08/08/13 0740 12/27/13 2311 12/28/13 0443  AST $Re'17 25 16  'MEr$ ALT $R'7 5 5  'Mt$ ALKPHOS 65 54 46  BILITOT 0.8 0.3 0.2*  PROT 5.2* 7.9 6.8  ALBUMIN 1.5* 2.6* 2.2*    Recent Labs  07/27/13 1130  LIPASE 68*   No results found for this basename: AMMONIA,  in the last 8760 hours CBC:  Recent Labs  07/27/13 1130  08/07/13 1617  12/27/13 2311 12/28/13 0443 12/30/13 0750  WBC 3.7*  < > 6.2  < > 5.4 4.6 3.4*  NEUTROABS 2.5  --  4.6  --   --  2.9  --   HGB 9.4*  < > 8.5*  < > 11.0* 10.0* 9.8*  HCT 29.2*  < > 27.2*  < > 33.9* 31.3* 30.8*  MCV 99.3  < > 100.4*  < > 100.9* 100.6* 101.7*  PLT 207  < > 189  < > 180 174 162  < > = values in this interval not displayed. Cardiac Enzymes:  Recent Labs  07/27/13 1130 12/28/13 0010  CKTOTAL 35  --   TROPONINI  --  <0.30   BNP: No components found with this basename: POCBNP,  CBG:  Recent Labs  08/07/13 2020  GLUCAP 80   TSH:  Recent Labs  07/27/13 2254  TSH 5.006*   Result: 01/02/2014 2:47 PM ( Status: F )  TSH 20.375 H 0.350-4.500 uIU/mL SLN  CBC NO Diff (Complete Blood Count)  Result: 01/10/2014 3:34 PM ( Status: F )  WBC 4.1 4.0-10.5 K/uL SLN  RBC 3.31 L 3.87-5.11 MIL/uL SLN  Hemoglobin 10.5 L  12.0-15.0 g/dL SLN  Hematocrit 31.4 L 36.0-46.0 % SLN  MCV 94.9 78.0-100.0 fL  SLN  MCH 31.7 26.0-34.0 pg SLN  MCHC 33.4 30.0-36.0 g/dL SLN  RDW 14.4 11.5-15.5 % SLN  Platelet Count 229 150-400 K/uL SLN  Basic Metabolic Panel  Result: 1/77/1165 3:56 PM ( Status: F )  Sodium 139 135-145 mEq/L SLN  Potassium 4.2 3.5-5.3 mEq/L SLN  Chloride 105 96-112 mEq/L SLN  CO2 26 19-32 mEq/L SLN  Glucose 88 70-99 mg/dL SLN  BUN 39 H 6-23 mg/dL SLN  Creatinine 1.59 H 0.50-1.10 mg/dL SLN  Calcium 8.3 L 8.4-10.5 mg/dL SL  CBC with Diff  Result: 01/29/2014 8:59 PM ( Status: F ) C  WBC 3.4 L 4.0-10.5 K/uL SLN  RBC 3.50 L 3.87-5.11 MIL/uL SLN  Hemoglobin 11.1 L 12.0-15.0 g/dL SLN  Hematocrit 33.1 L 36.0-46.0 % SLN  MCV 94.6 78.0-100.0 fL SLN  MCH 31.7 26.0-34.0 pg SLN  MCHC 33.5 30.0-36.0 g/dL SLN  RDW 14.4 11.5-15.5 % SLN  Platelet Count 178 150-400 K/uL SLN  Granulocyte % 50 43-77 % SLN  Absolute Gran 1.7 1.7-7.7 K/uL SLN  Lymph % 35 12-46 % SLN  Absolute Lymph 1.2 0.7-4.0 K/uL SLN  Mono % 13 H 3-12 % SLN  Absolute Mono 0.4 0.1-1.0 K/uL SLN  Eos % 2 0-5 % SLN  Absolute Eos 0.1 0.0-0.7 K/uL SLN  Baso % 0 0-1 % SLN  Absolute Baso 0.0 0.0-0.1 K/uL SLN  Smear Review Criteria for review not met   Assessment/Plan 1. HYPERTENSION -stable, will follow up bmp  2. HYPOTHYROIDISM TSH   Result: 03/31/2014 3:08 PM   ( Status: F )       TSH 19.110   H 0.350-4.500 uIU/mL SLN Will increase synthroid to 150 mcg and follow up TSH in 6 weeks  3. Dementia without behavioral disturbance -cognitive and functional status has remained stable  4. Acute blood loss anemia -iron level low, hgb is stable at this time -will increase ferrous sulfate to TID, hemacult stools x3   5. CKD (chronic kidney disease) stage 4, GFR 15-29 ml/min Will follow up bmp

## 2014-04-09 LAB — BASIC METABOLIC PANEL
BUN: 41 mg/dL — AB (ref 4–21)
Creatinine: 1.9 mg/dL — AB (ref 0.5–1.1)
Glucose: 84 mg/dL
Potassium: 4 mmol/L (ref 3.4–5.3)
SODIUM: 138 mmol/L (ref 137–147)

## 2014-04-30 ENCOUNTER — Non-Acute Institutional Stay (SKILLED_NURSING_FACILITY): Payer: Medicare Other | Admitting: Nurse Practitioner

## 2014-04-30 DIAGNOSIS — D62 Acute posthemorrhagic anemia: Secondary | ICD-10-CM

## 2014-04-30 DIAGNOSIS — E039 Hypothyroidism, unspecified: Secondary | ICD-10-CM

## 2014-04-30 DIAGNOSIS — I509 Heart failure, unspecified: Secondary | ICD-10-CM

## 2014-04-30 DIAGNOSIS — I10 Essential (primary) hypertension: Secondary | ICD-10-CM

## 2014-04-30 DIAGNOSIS — F0390 Unspecified dementia without behavioral disturbance: Secondary | ICD-10-CM

## 2014-04-30 NOTE — Progress Notes (Signed)
Patient ID: Emily Peters, female   DOB: August 27, 1915, 80 y.o.   MRN: [id number]    Nursing Home Location:  [place] of Service: SNF (31)  PCP: Kennon Holter, MD  No Known Allergies  Chief Complaint  Patient presents with  . Medical Management of Chronic Issues    HPI:  Patient is a 80 y.o. female with pmh of Atrial fibrillation, CHF, HTN, PVD, GERD, Esophageal Stricture, Hypothyroidism, Dementia, Osteoporosis, CKD, and anemia who is a long term resident of the facility and is being seen today for routine management of chronic conditions Pt recent labs reports positive hemoccult times 2/2.  Pt is a poor historian but reports dizziness.   Review of Systems:  Unable to obtain pt is a poor historian   Past Medical History  Diagnosis Date  . PAC (premature atrial contraction)   . Thyroid disease   . Hypertension   . Dementia   . Cataract   . Acid reflux   . CHF (congestive heart failure)   . Renal disorder   . Hypothyroidism   . Arthritis   . Peripheral vascular disease    Past Surgical History  Procedure Laterality Date  . Abdominal hysterectomy    . Eye surgery    . Esophageal dilation      x 2   Social History:   reports that she has never smoked. She has never used smokeless tobacco. She reports that she does not drink alcohol or use illicit drugs.  Family History  Problem Relation Age of Onset  . Heart failure    . Heart attack      Medications: Patient's Medications  New Prescriptions   No medications on file  Previous Medications   ACETAMINOPHEN (TYLENOL) 325 MG TABLET    Take 650 mg by mouth every 6 (six) hours as needed for mild pain, fever or headache.   ALBUTEROL (PROVENTIL) (2.5 MG/3ML) 0.083% NEBULIZER SOLUTION    Take 3 mLs (2.5 mg total) by nebulization 3 (three) times daily.   AMLODIPINE (NORVASC) 5 MG TABLET    Take 1 tablet (5 mg total) by mouth daily.   ASPIRIN 81 MG TABLET    Take 81 mg by mouth daily.   CHOLECALCIFEROL (VITAMIN D) 1000 UNITS TABLET    Take 2,000 Units by mouth daily.    CYANOCOBALAMIN (VITAMIN B-12 IJ)    Inject 1 application as directed every 30 (thirty) days.    DOCUSATE SODIUM (COLACE) 100 MG CAPSULE    Take 1 capsule (100 mg total) by mouth 2 (two) times daily.   DONEPEZIL (ARICEPT) 10 MG TABLET    Take 10 mg by mouth at bedtime.   FUROSEMIDE (LASIX) 40 MG TABLET    Take 1 tablet (40 mg total) by mouth daily.   LEVOTHYROXINE (SYNTHROID, LEVOTHROID) 125 MCG TABLET    Take 150 mcg by mouth daily before breakfast.    MAGNESIUM HYDROXIDE (MILK OF MAGNESIA) 400 MG/5ML SUSPENSION    Take 30 mLs by mouth daily as needed for mild constipation.   METOPROLOL (LOPRESSOR) 50 MG TABLET    Take 50 mg by mouth 2 (two) times daily.   OMEPRAZOLE (PRILOSEC) 20 MG CAPSULE    Take 40 mg by mouth daily.   POLYETHYLENE GLYCOL (MIRALAX / GLYCOLAX) PACKET    Take 17 g by mouth daily.   RANITIDINE (ZANTAC) 150 MG TABLET    Take 150 mg by mouth daily.   SKIN PROTECTANTS, MISC. (EUCERIN) CREAM  Apply 1 application topically 3 (three) times daily as needed for dry skin.   Modified Medications   Modified Medication Previous Medication   FERROUS SULFATE 325 (65 FE) MG TABLET ferrous sulfate 325 (65 FE) MG tablet      Take 325 mg by mouth 3 (three) times daily with meals.    Take 1 tablet (325 mg total) by mouth daily.  Discontinued Medications   No medications on file     Physical Exam: Physical Exam  Nursing note and vitals reviewed. Constitutional: No distress.  HENT:  Head: Normocephalic and atraumatic.  Mouth/Throat: Oropharynx is clear and moist. No oropharyngeal exudate.  Eyes: Pupils are equal, round, and reactive to light. Right eye exhibits no discharge. Left eye exhibits no discharge.  Neck: Normal range of motion. Neck supple.  Cardiovascular: Normal rate and intact distal pulses.   Pulmonary/Chest: Effort normal and breath sounds normal. No respiratory distress.  Abdominal:  Soft. Bowel sounds are normal.  Musculoskeletal: She exhibits no edema and no tenderness.  Neurological: She is alert.  Skin: Skin is warm and dry. No rash noted. She is not diaphoretic.  Psychiatric: Mood normal. She exhibits abnormal new learning ability, abnormal recent memory and abnormal remote memory.     Filed Vitals:   04/30/14 1212  BP: 139/79  Pulse: 70  Temp: 97 F (36.1 C)  Resp: 20  Weight: 130 lb (58.968 kg)      Labs reviewed: Basic Metabolic Panel:  Recent Labs  12/27/13 2311 12/28/13 0443 12/30/13 0750  NA 140 143 143  K 4.1 3.8 3.9  CL 102 107 109  CO2 $Re'23 23 21  'zax$ GLUCOSE 131* 107* 94  BUN 58* 57* 48*  CREATININE 2.30* 2.35* 1.83*  CALCIUM 8.9 8.1* 8.2*   Liver Function Tests:  Recent Labs  08/08/13 0740 12/27/13 2311 12/28/13 0443  AST $Re'17 25 16  'WsK$ ALT $R'7 5 5  'Ak$ ALKPHOS 65 54 46  BILITOT 0.8 0.3 0.2*  PROT 5.2* 7.9 6.8  ALBUMIN 1.5* 2.6* 2.2*    Recent Labs  07/27/13 1130  LIPASE 68*   No results found for this basename: AMMONIA,  in the last 8760 hours CBC:  Recent Labs  07/27/13 1130  08/07/13 1617  12/27/13 2311 12/28/13 0443 12/30/13 0750  WBC 3.7*  < > 6.2  < > 5.4 4.6 3.4*  NEUTROABS 2.5  --  4.6  --   --  2.9  --   HGB 9.4*  < > 8.5*  < > 11.0* 10.0* 9.8*  HCT 29.2*  < > 27.2*  < > 33.9* 31.3* 30.8*  MCV 99.3  < > 100.4*  < > 100.9* 100.6* 101.7*  PLT 207  < > 189  < > 180 174 162  < > = values in this interval not displayed. Cardiac Enzymes:  Recent Labs  07/27/13 1130 12/28/13 0010  CKTOTAL 35  --   TROPONINI  --  <0.30   BNP: No components found with this basename: POCBNP,  CBG:  Recent Labs  08/07/13 2020  GLUCAP 80   TSH:  Recent Labs  07/27/13 2254  TSH 5.006*   Result: 01/02/2014 2:47 PM ( Status: F )  TSH 20.375 H 0.350-4.500 uIU/mL SLN  CBC NO Diff (Complete Blood Count)  Result: 01/10/2014 3:34 PM ( Status: F )  WBC 4.1 4.0-10.5 K/uL SLN  RBC 3.31 L 3.87-5.11 MIL/uL SLN  Hemoglobin 10.5  L 12.0-15.0 g/dL SLN  Hematocrit 31.4 L 36.0-46.0 % SLN  MCV  94.9 78.0-100.0 fL SLN  MCH 31.7 26.0-34.0 pg SLN  MCHC 33.4 30.0-36.0 g/dL SLN  RDW 14.4 11.5-15.5 % SLN  Platelet Count 229 150-400 K/uL SLN  Basic Metabolic Panel  Result: 04/29/9832 3:56 PM ( Status: F )  Sodium 139 135-145 mEq/L SLN  Potassium 4.2 3.5-5.3 mEq/L SLN  Chloride 105 96-112 mEq/L SLN  CO2 26 19-32 mEq/L SLN  Glucose 88 70-99 mg/dL SLN  BUN 39 H 6-23 mg/dL SLN  Creatinine 1.59 H 0.50-1.10 mg/dL SLN  Calcium 8.3 L 8.4-10.5 mg/dL SL  CBC with Diff  Result: 01/29/2014 8:59 PM ( Status: F ) C  WBC 3.4 L 4.0-10.5 K/uL SLN  RBC 3.50 L 3.87-5.11 MIL/uL SLN  Hemoglobin 11.1 L 12.0-15.0 g/dL SLN  Hematocrit 33.1 L 36.0-46.0 % SLN  MCV 94.6 78.0-100.0 fL SLN  MCH 31.7 26.0-34.0 pg SLN  MCHC 33.5 30.0-36.0 g/dL SLN  RDW 14.4 11.5-15.5 % SLN  Platelet Count 178 150-400 K/uL SLN  Granulocyte % 50 43-77 % SLN  Absolute Gran 1.7 1.7-7.7 K/uL SLN  Lymph % 35 12-46 % SLN  Absolute Lymph 1.2 0.7-4.0 K/uL SLN  Mono % 13 H 3-12 % SLN  Absolute Mono 0.4 0.1-1.0 K/uL SLN  Eos % 2 0-5 % SLN  Absolute Eos 0.1 0.0-0.7 K/uL SLN  Baso % 0 0-1 % SLN  Absolute Baso 0.0 0.0-0.1 K/uL SLN  Smear Review Criteria for review not met  Iron and IBC    Result: 03/16/2014 4:35 PM   ( Status: F )       Iron <10   L 42-145 ug/dL SLN C UIBC >575   H 125-400 ug/dL SLN   TIBC NOT CALC     250-470 ug/dL SLN C %SAT NOT CALC     20-55 % SLN C CBC NO Diff (Complete Blood Count)    Result: 03/16/2014 2:42 PM   ( Status: F )       WBC 2.8   L 4.0-10.5 K/uL SLN   RBC 3.50   L 3.87-5.11 MIL/uL SLN   Hemoglobin 11.2   L 12.0-15.0 g/dL SLN   Hematocrit 33.9   L 36.0-46.0 % SLN   MCV 96.9     78.0-100.0 fL SLN   MCH 32.0     26.0-34.0 pg SLN   MCHC 33.0     30.0-36.0 g/dL SLN   RDW 14.2     11.5-15.5 % SLN   Platelet Count 187     150-400 K/uL SLN   Ferritin    Result: 03/16/2014 3:02 PM   ( Status: F )       Ferritin TSH, Ultrasensitive     Result: 03/31/2014 3:08 PM   ( Status: F )       TSH 19.110   H 0.350-4.500 uIU/mL SLN  Assessment/Plan  1. HYPERTENSION -stable at this time.   2. Chronic congestive heart failure, unspecified congestive heart failure type -without signs of fluid overload, will cont to monitor   3. HYPOTHYROIDISM -synthroid increased, has pending TSH scheduled  4. Dementia without behavioral disturbance -stable without acute changes  5. Acute blood loss anemia -hem positive stool, will dc asa and cont to monitor -conts iron

## 2014-05-02 LAB — TSH: TSH: 9.89 u[IU]/mL — AB (ref <=5.90)

## 2014-05-02 LAB — CBC AND DIFFERENTIAL
HCT: 32 % — AB (ref 36–46)
HEMOGLOBIN: 10.6 g/dL — AB (ref 12.0–16.0)
Platelets: 247 10*3/uL (ref 150–399)
WBC: 3.5 10*3/mL

## 2014-05-21 IMAGING — CR DG CHEST 2V
2 series · 2 of 2 positions shown · non-contrast
Comparison: 07/28/2013 and 04/14/2009

CLINICAL DATA: Aspiration, shortness of breath.

EXAM:
CHEST  2 VIEW

[x chest ap]
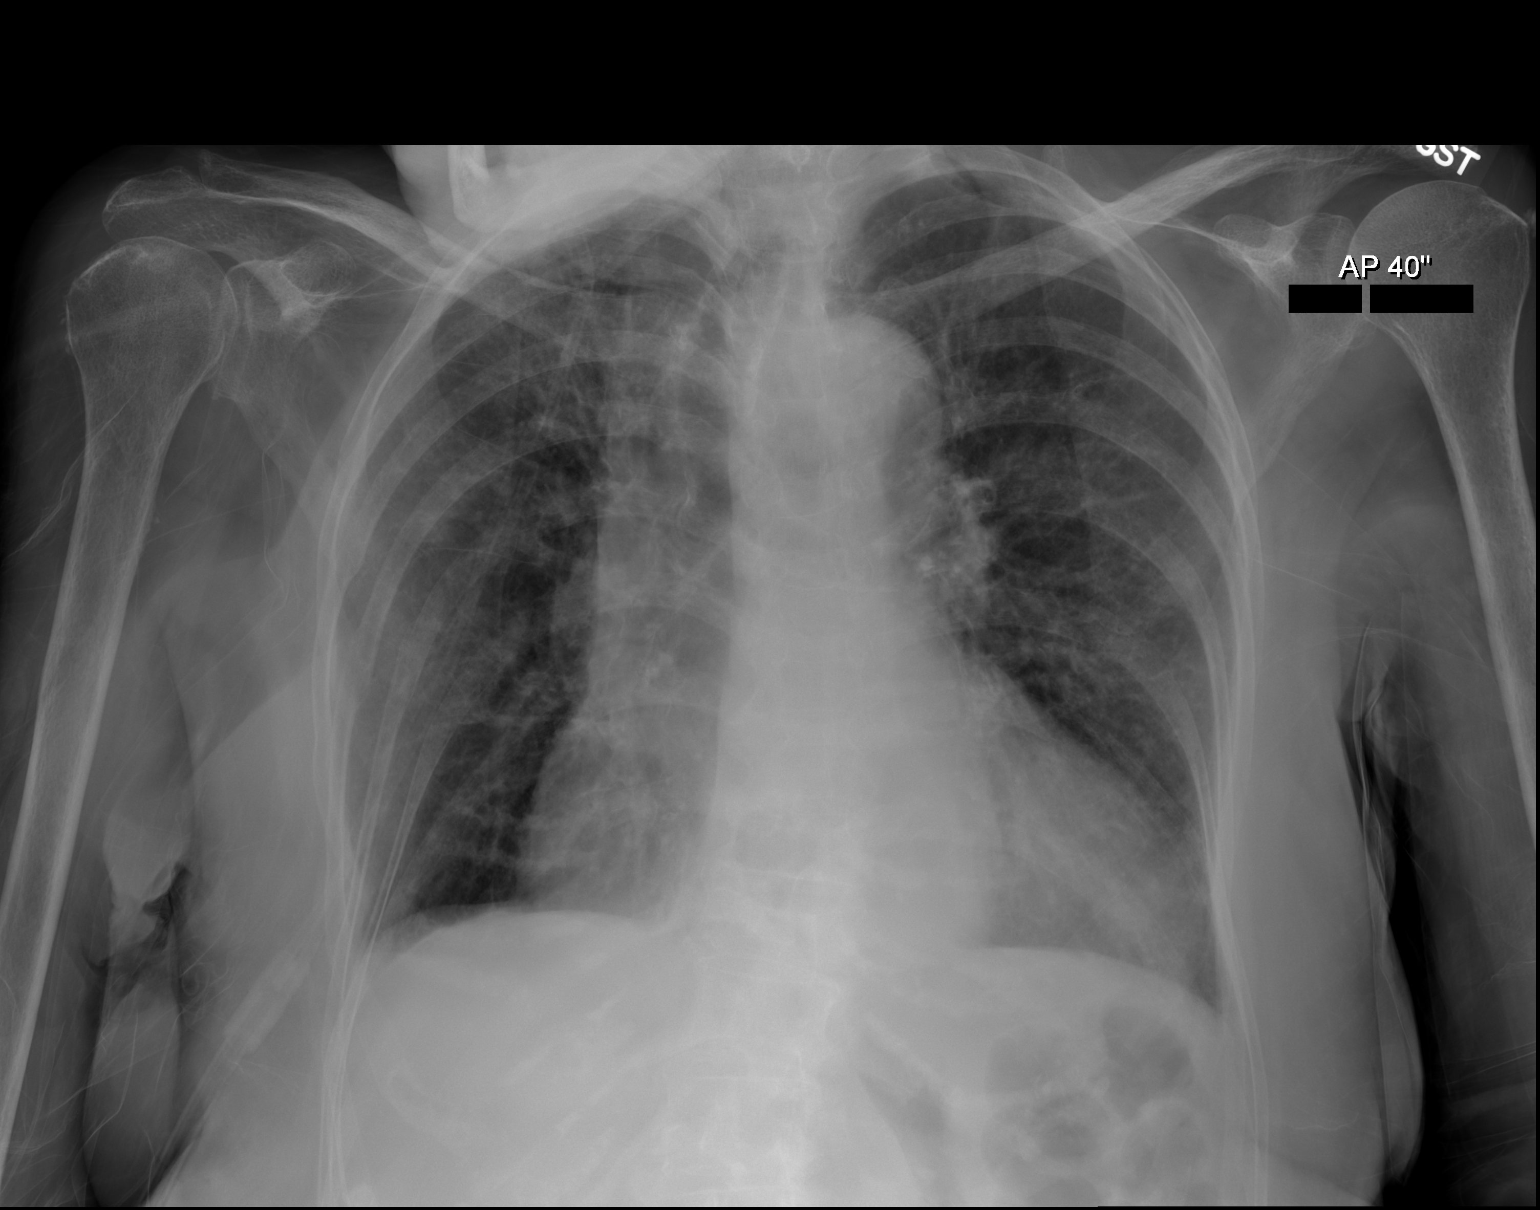

[w chest lat]
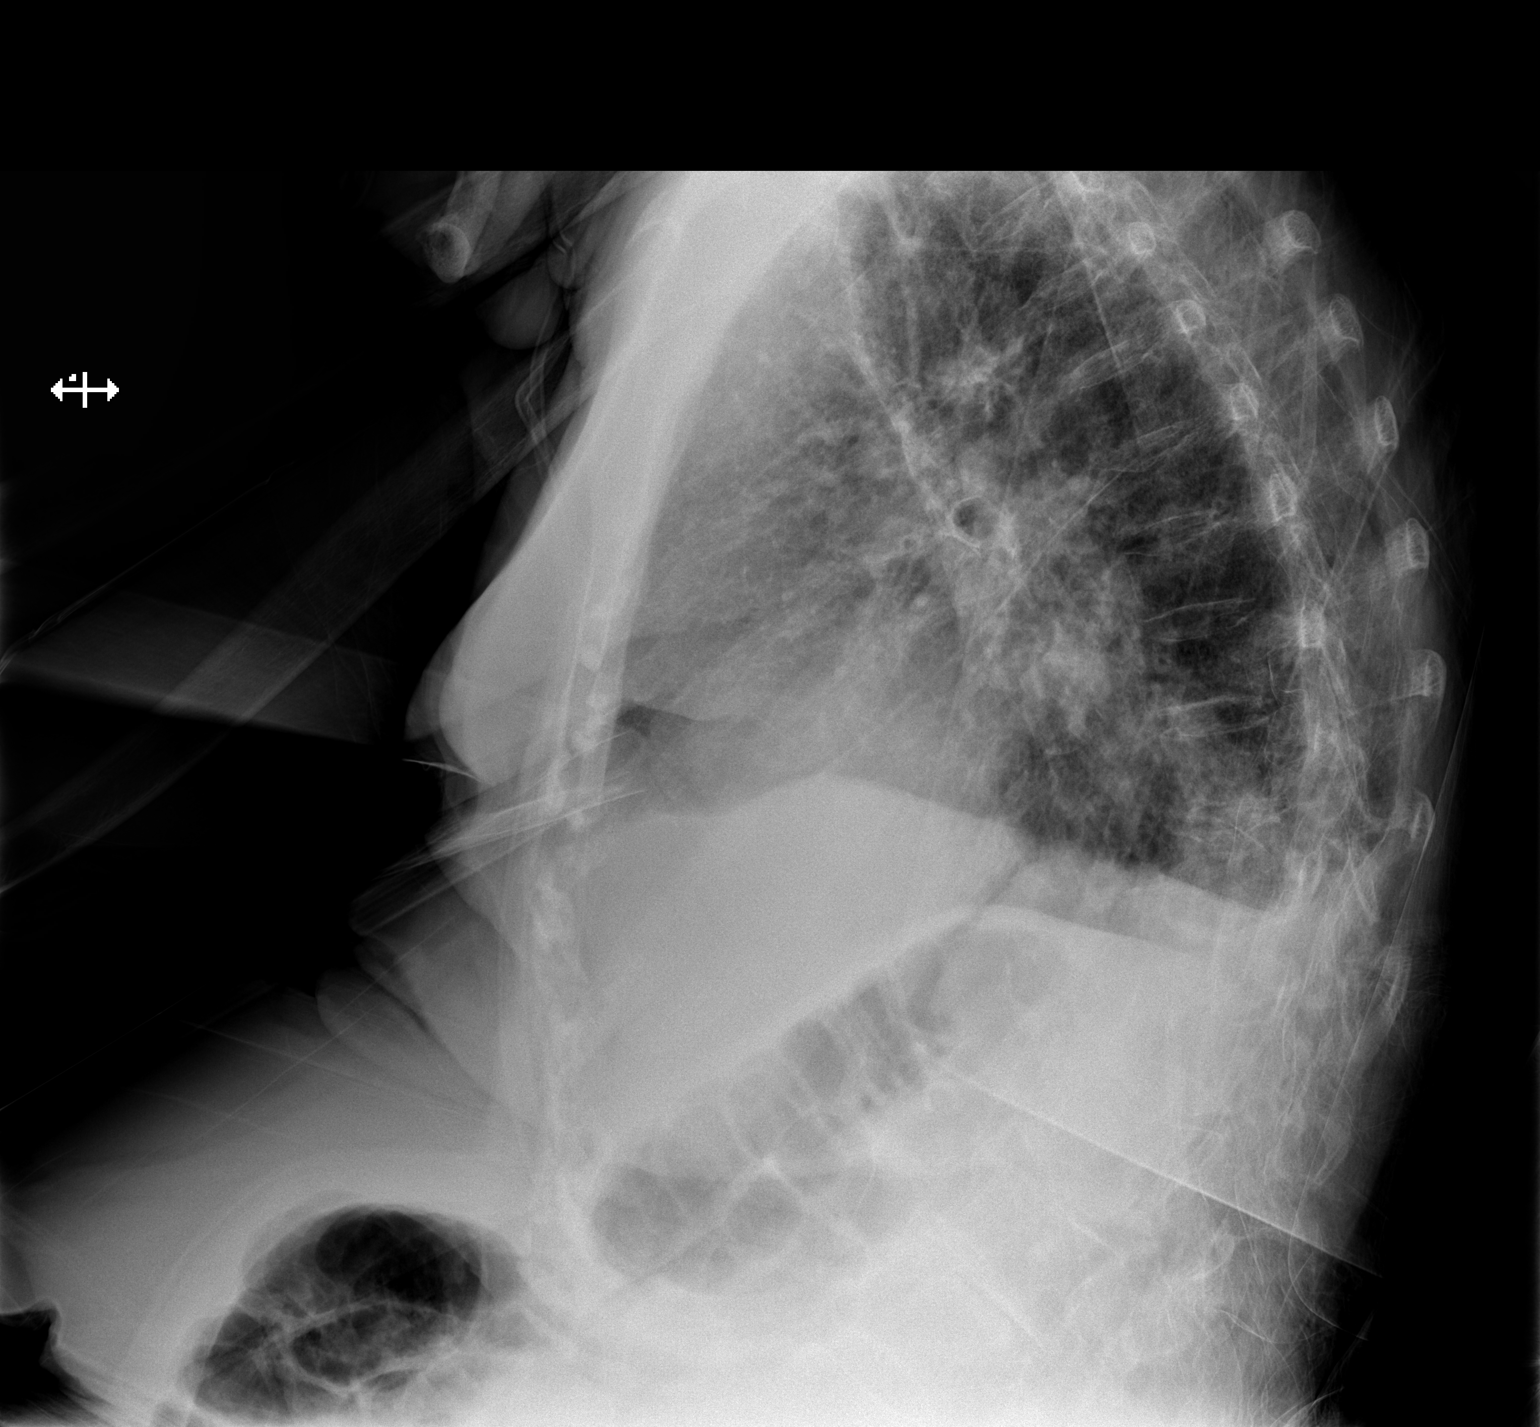

[2 of 2 positions shown; findings below may reference images not displayed]

FINDINGS: Lungs are adequately inflated with minimal opacification over the
posterior left base as cannot exclude atelectasis or infection.
Minimal blunting of the right posterior costophrenic angle as cannot
exclude a small amount of pleural fluid. There is mild stable
cardiomegaly. There is moderate degenerative change of the spine.
Known compression fracture in the region of the thoracolumbar
junction. Old right rib fractures.
IMPRESSION: Mild opacification over the posterior left base which may be due to
atelectasis or infection. Possible small amount of posterior right
pleural fluid.

Known compression fracture over the region of the thoracolumbar
junction.

## 2014-06-11 ENCOUNTER — Non-Acute Institutional Stay (SKILLED_NURSING_FACILITY): Payer: Medicare Other | Admitting: Internal Medicine

## 2014-06-11 ENCOUNTER — Encounter: Payer: Self-pay | Admitting: Internal Medicine

## 2014-06-11 DIAGNOSIS — K219 Gastro-esophageal reflux disease without esophagitis: Secondary | ICD-10-CM

## 2014-06-11 DIAGNOSIS — I5022 Chronic systolic (congestive) heart failure: Secondary | ICD-10-CM

## 2014-06-11 DIAGNOSIS — I482 Chronic atrial fibrillation, unspecified: Secondary | ICD-10-CM

## 2014-06-11 DIAGNOSIS — I509 Heart failure, unspecified: Secondary | ICD-10-CM

## 2014-06-11 DIAGNOSIS — K625 Hemorrhage of anus and rectum: Secondary | ICD-10-CM

## 2014-06-11 DIAGNOSIS — I10 Essential (primary) hypertension: Secondary | ICD-10-CM

## 2014-06-11 DIAGNOSIS — I4891 Unspecified atrial fibrillation: Secondary | ICD-10-CM

## 2014-06-11 DIAGNOSIS — E039 Hypothyroidism, unspecified: Secondary | ICD-10-CM

## 2014-06-11 DIAGNOSIS — N184 Chronic kidney disease, stage 4 (severe): Secondary | ICD-10-CM

## 2014-06-11 DIAGNOSIS — F0390 Unspecified dementia without behavioral disturbance: Secondary | ICD-10-CM

## 2014-06-11 NOTE — Assessment & Plan Note (Signed)
7/12 -  41/1.89; last in 12/2013 was 39/1.59, basically stable

## 2014-06-11 NOTE — Progress Notes (Signed)
MRN: [id number] Name: Emily Peters  Sex: female Age: 80 y.o. DOB: 07/31/1915  PSC #: Sonny Dandy Facility/Room: Level Of Care: SNF Provider: Merrilee Seashore D Emergency Contacts: Extended Emergency Contact Information Primary Emergency Contact: Stankovic,[name] W Address: [address]          [place], [place] [address] [place] Home Phone: [phone] Mobile Phone: [phone] Relation: Son Secondary Emergency Contact: Wyly,Hope Address: [address]          [place], [place] [address] [place] Home Phone: [phone] Relation: Daughter  Code Status: DNR  Allergies: Review of patient's allergies indicates no known allergies.  Chief Complaint  Patient presents with  . Medical Management of Chronic Issues    HPI: Patient is 80 y.o. female who is being seen for a routine visit.  Past Medical History  Diagnosis Date  . PAC (premature atrial contraction)   . Thyroid disease   . Hypertension   . Dementia   . Cataract   . Acid reflux   . CHF (congestive heart failure)   . Renal disorder   . Hypothyroidism   . Arthritis   . Peripheral vascular disease     Past Surgical History  Procedure Laterality Date  . Abdominal hysterectomy    . Eye surgery    . Esophageal dilation      x 2      Medication List       This list is accurate as of: 06/11/14 11:59 PM.  Always use your most recent med list.               acetaminophen 325 MG tablet  Commonly known as:  TYLENOL  Take 650 mg by mouth every 6 (six) hours as needed for mild pain, fever or headache.     albuterol (2.5 MG/3ML) 0.083% nebulizer solution  Commonly known as:  PROVENTIL  Take 3 mLs (2.5 mg total) by nebulization 3 (three) times daily.     amLODipine 5 MG tablet  Commonly known as:  NORVASC  Take 1 tablet (5 mg total) by mouth daily.     aspirin 81 MG tablet  Take 81 mg by mouth daily.     cholecalciferol 1000 UNITS tablet  Commonly known as:  VITAMIN D  Take 2,000  Units by mouth daily.     docusate sodium 100 MG capsule  Commonly known as:  COLACE  Take 1 capsule (100 mg total) by mouth 2 (two) times daily.     donepezil 10 MG tablet  Commonly known as:  ARICEPT  Take 10 mg by mouth at bedtime.     eucerin cream  Apply 1 application topically 3 (three) times daily as needed for dry skin.     ferrous sulfate 325 (65 FE) MG tablet  Take 325 mg by mouth 3 (three) times daily with meals.     furosemide 40 MG tablet  Commonly known as:  LASIX  Take 1 tablet (40 mg total) by mouth daily.     levothyroxine 125 MCG tablet  Commonly known as:  SYNTHROID, LEVOTHROID  Take 150 mcg by mouth daily before breakfast.     magnesium hydroxide 400 MG/5ML suspension  Commonly known as:  MILK OF MAGNESIA  Take 30 mLs by mouth daily as needed for mild constipation.     metoprolol 50 MG tablet  Commonly known as:  LOPRESSOR  Take 50 mg by mouth 2 (two) times daily.     omeprazole 20 MG capsule  Commonly known as:  PRILOSEC  Take 40 mg by mouth daily.     polyethylene glycol packet  Commonly known as:  MIRALAX / GLYCOLAX  Take 17 g by mouth daily.     ranitidine 150 MG tablet  Commonly known as:  ZANTAC  Take 150 mg by mouth daily.     VITAMIN B-12 IJ  Inject 1 application as directed every 30 (thirty) days.        No orders of the defined types were placed in this encounter.    Immunization History  Administered Date(s) Administered  . Influenza Whole 06/17/2009  . Influenza-Unspecified 06/19/2013  . Pneumococcal Polysaccharide-23 01/28/2009  . Td 01/28/2009  . Tdap 06/02/2012    History  Substance Use Topics  . Smoking status: Never Smoker   . Smokeless tobacco: Never Used  . Alcohol Use: No    Review of Systems  DATA OBTAINED: from patient, nurse; no c/o GENERAL: no fevers, fatigue, appetite changes SKIN: No itching, rash HEENT: No complaint RESPIRATORY: No cough, wheezing, SOB CARDIAC: No chest pain, palpitations, lower  extremity edema  GI: No abdominal pain, No N/V/D or constipation, No heartburn or reflux  GU: No dysuria, frequency or urgency, or incontinence  MUSCULOSKELETAL: No unrelieved bone/joint pain NEUROLOGIC: No headache, dizziness  PSYCHIATRIC: No overt anxiety or sadness. Sleeps well.   Filed Vitals:   06/11/14 1339  BP: 123/65  Pulse: 73  Temp: 96.6 F (35.9 C)  Resp: 18    Physical Exam  GENERAL APPEARANCE: Alert, minconversant. No acute distress  SKIN: No diaphoresis rash HEENT: Unremarkable RESPIRATORY: Breathing is even, unlabored. Lung sounds are clear   CARDIOVASCULAR: Heart RRR no murmurs, rubs or gallops. No peripheral edema  GASTROINTESTINAL: Abdomen is soft, non-tender, not distended w/ normal bowel sounds.  GENITOURINARY: Bladder non tender, not distended  MUSCULOSKELETAL: No abnormal joints or musculature NEUROLOGIC: Cranial nerves 2-12 grossly intact.  PSYCHIATRIC: dementia, no behavioral issues  Patient Active Problem List   Diagnosis Date Noted  . HCAP (healthcare-associated pneumonia) 12/28/2013  . UTI (lower urinary tract infection) 12/28/2013  . Acute encephalopathy 12/28/2013  . Pneumonia 12/28/2013  . Atrial fibrillation 08/14/2013  . CKD (chronic kidney disease) stage 4, GFR 15-29 ml/min 08/08/2013  . Rectal bleed 08/07/2013  . Acute blood loss anemia 08/07/2013  . Failure to thrive 07/27/2013  . CHF, chronic (mild systolic EF40% and grade 2 DD) 07/27/2013  . Right hip pain 07/27/2013  . Peripheral vascular disease 07/27/2013  . RIB PAIN, RIGHT SIDED 08/13/2009  . HYPERTENSION 06/17/2009  . OSTEOPOROSIS 04/14/2009  . OTHER DYSPHAGIA 04/14/2009  . SPINAL STENOSIS, LUMBAR 03/29/2009  . B12 DEFICIENCY 03/15/2009  . ANEMIA, PERNICIOUS 03/15/2009  . INCONTINENCE 03/15/2009  . HYPOTHYROIDISM 02/11/2009  . Dementia without behavioral disturbance 02/11/2009  . ESOPHAGEAL STRICTURE 02/11/2009  . GERD 02/11/2009  . ABNORMALITY OF GAIT 02/11/2009     CBC    Component Value Date/Time   WBC 3.5 05/02/2014   WBC 3.4* 12/30/2013 0750   RBC 3.03* 12/30/2013 0750   RBC 3.01* 08/08/2013 1010   HGB 10.6* 05/02/2014   HCT 32* 05/02/2014   PLT 247 05/02/2014   MCV 101.7* 12/30/2013 0750   LYMPHSABS 1.0 12/28/2013 0443   MONOABS 0.8 12/28/2013 0443   EOSABS 0.0 12/28/2013 0443   BASOSABS 0.0 12/28/2013 0443    CMP     Component Value Date/Time   NA 138 04/09/2014   NA 143 12/30/2013 0750   K 4.0 04/09/2014   CL 109  12/30/2013 0750   CO2 21 12/30/2013 0750   GLUCOSE 94 12/30/2013 0750   BUN 41* 04/09/2014   BUN 48* 12/30/2013 0750   CREATININE 1.9* 04/09/2014   CREATININE 1.83* 12/30/2013 0750   CALCIUM 8.2* 12/30/2013 0750   PROT 6.8 12/28/2013 0443   ALBUMIN 2.2* 12/28/2013 0443   AST 16 12/28/2013 0443   ALT 5 12/28/2013 0443   ALKPHOS 46 12/28/2013 0443   BILITOT 0.2* 12/28/2013 0443   GFRNONAA 22* 12/30/2013 0750   GFRAA 26* 12/30/2013 0750    Assessment and Plan  HYPOTHYROIDISM 7/11 TSH -18.110; dose increased and 8/12 TSH 9.89; on 150 mcg TSH 29 ON 8/28 AND WAS INC SYNTHROID TO 175 MCG WITH NEW tsh IN 6 WEEKS.  CKD (chronic kidney disease) stage 4, GFR 15-29 ml/min 7/12 -  41/1.89; last in 12/2013 was 39/1.59, basically stable  Atrial fibrillation No problems.pt on metoprolol for rate;was onhemocults ASA which was d/c 2/2 heme+  HYPERTENSION Controlled on norvasc, lopressor and lasix  CHF, chronic (mild systolic EF40% and grade 2 DD) No signs or sx of volume overload  GERD Continue omeprazole  Rectal bleed So not surprising pt had 2 positive hemocults  Dementia without behavioral disturbance Progressive; continue aricept    Margit Hanks, MD

## 2014-06-11 NOTE — Assessment & Plan Note (Signed)
7/11 TSH -18.110; dose increased and 8/12 TSH 9.89; on 150 mcg TSH 29 ON 8/28 AND WAS INC SYNTHROID TO 175 MCG WITH NEW tsh IN 6 WEEKS.

## 2014-06-17 ENCOUNTER — Encounter: Payer: Self-pay | Admitting: Internal Medicine

## 2014-06-17 NOTE — Assessment & Plan Note (Addendum)
No problems.pt on metoprolol for rate;was onhemocults ASA which was d/c 2/2 heme+

## 2014-06-17 NOTE — Assessment & Plan Note (Signed)
Continue omeprazole 

## 2014-06-17 NOTE — Assessment & Plan Note (Signed)
So not surprising pt had 2 positive hemocults

## 2014-06-17 NOTE — Assessment & Plan Note (Signed)
No signs or sx of volume overload

## 2014-06-17 NOTE — Assessment & Plan Note (Signed)
Controlled on norvasc, lopressor and lasix

## 2014-06-17 NOTE — Assessment & Plan Note (Addendum)
Progressive; continue aricept

## 2014-07-06 ENCOUNTER — Other Ambulatory Visit: Payer: Self-pay | Admitting: *Deleted

## 2014-07-06 MED ORDER — TRAMADOL HCL 50 MG PO TABS: ORAL_TABLET | ORAL | Status: DC

## 2014-07-06 NOTE — Telephone Encounter (Signed)
[facility] 

## 2014-07-20 ENCOUNTER — Non-Acute Institutional Stay (SKILLED_NURSING_FACILITY): Payer: Medicare Other | Admitting: Nurse Practitioner

## 2014-07-20 DIAGNOSIS — F0390 Unspecified dementia without behavioral disturbance: Secondary | ICD-10-CM

## 2014-07-20 DIAGNOSIS — I482 Chronic atrial fibrillation, unspecified: Secondary | ICD-10-CM

## 2014-07-20 DIAGNOSIS — N184 Chronic kidney disease, stage 4 (severe): Secondary | ICD-10-CM

## 2014-07-20 DIAGNOSIS — D509 Iron deficiency anemia, unspecified: Secondary | ICD-10-CM

## 2014-07-20 DIAGNOSIS — I10 Essential (primary) hypertension: Secondary | ICD-10-CM

## 2014-07-20 DIAGNOSIS — E039 Hypothyroidism, unspecified: Secondary | ICD-10-CM

## 2014-07-20 NOTE — Progress Notes (Signed)
Patient ID: Emily Peters, female   DOB: 1914/12/08, 80 y.o.   MRN: [id number]    Nursing Home Location:  [facility]   Place of Service: SNF (31)  PCP: Fredderick Severance, MD  No Known Allergies  Chief Complaint  Patient presents with  . Medical Management of Chronic Issues    HPI:  Patient is a 80 y.o. female seen today at [facility], being seen today for routine follow up. She has a pmh of Atrial fibrillation, CHF, HTN, PVD, GERD, Esophageal Stricture, Hypothyroidism, Dementia, Osteoporosis, CKD, and anemia with hem positive stools. Pt does not participate in HPI or ROS. Nursing without concerns at this time. In the last months TSH have been elevated with adjustments in synthroid made.  Review of Systems:  Review of Systems  Unable to perform ROS: Dementia    Past Medical History  Diagnosis Date  . PAC (premature atrial contraction)   . Thyroid disease   . Hypertension   . Dementia   . Cataract   . Acid reflux   . CHF (congestive heart failure)   . Renal disorder   . Hypothyroidism   . Arthritis   . Peripheral vascular disease    Past Surgical History  Procedure Laterality Date  . Abdominal hysterectomy    . Eye surgery    . Esophageal dilation      x 2   Social History:   reports that she has never smoked. She has never used smokeless tobacco. She reports that she does not drink alcohol or use illicit drugs.  Family History  Problem Relation Age of Onset  . Heart failure    . Heart attack      Medications: Patient's Medications  New Prescriptions   No medications on file  Previous Medications   ACETAMINOPHEN (TYLENOL) 325 MG TABLET    Take 650 mg by mouth every 6 (six) hours as needed for mild pain, fever or headache.   ALBUTEROL (PROVENTIL) (2.5 MG/3ML) 0.083% NEBULIZER SOLUTION    Take 3 mLs (2.5 mg total) by nebulization 3 (three) times daily.   AMLODIPINE (NORVASC) 5 MG TABLET    Take 1 tablet (5 mg total) by mouth daily.   ASPIRIN 81 MG TABLET    Take 81 mg by mouth daily.   CHOLECALCIFEROL (VITAMIN D) 1000 UNITS TABLET    Take 2,000 Units by mouth daily.    CYANOCOBALAMIN (VITAMIN B-12 IJ)    Inject 1 application as directed every 30 (thirty) days.    DOCUSATE SODIUM (COLACE) 100 MG CAPSULE    Take 1 capsule (100 mg total) by mouth 2 (two) times daily.   DONEPEZIL (ARICEPT) 10 MG TABLET    Take 10 mg by mouth at bedtime.   FERROUS SULFATE 325 (65 FE) MG TABLET    Take 325 mg by mouth 3 (three) times daily with meals.   FUROSEMIDE (LASIX) 40 MG TABLET    Take 1 tablet (40 mg total) by mouth daily.   LEVOTHYROXINE (SYNTHROID, LEVOTHROID) 125 MCG TABLET    Take 150 mcg by mouth daily before breakfast.    MAGNESIUM HYDROXIDE (MILK OF MAGNESIA) 400 MG/5ML SUSPENSION    Take 30 mLs by mouth daily as needed for mild constipation.   METOPROLOL (LOPRESSOR) 50 MG TABLET    Take 50 mg by mouth 2 (two) times daily.   OMEPRAZOLE (PRILOSEC) 20 MG CAPSULE    Take 40 mg by mouth daily.   POLYETHYLENE GLYCOL (MIRALAX /  GLYCOLAX) PACKET    Take 17 g by mouth daily.   RANITIDINE (ZANTAC) 150 MG TABLET    Take 150 mg by mouth daily.   SKIN PROTECTANTS, MISC. (EUCERIN) CREAM    Apply 1 application topically 3 (three) times daily as needed for dry skin.    TRAMADOL (ULTRAM) 50 MG TABLET    Take one tablet by mouth at bedtime for hip pain  Modified Medications   No medications on file  Discontinued Medications   No medications on file     Physical Exam: Filed Vitals:   07/20/14 1547  BP: 140/73  Pulse: 78  Temp: 97.6 F (36.4 C)  Resp: 20  Weight: 136 lb (61.689 kg)    Physical Exam  Constitutional: No distress.  Frail elderly female in NAD  HENT:  Mouth/Throat: Oropharynx is clear and moist. No oropharyngeal exudate.  Eyes: Conjunctivae and EOM are normal. Pupils are equal, round, and reactive to light.  Neck: Normal range of motion. Neck supple.  Cardiovascular: Normal rate, regular rhythm and normal heart sounds.    Pulmonary/Chest: Effort normal and breath sounds normal.  Abdominal: Soft. Bowel sounds are normal.  Musculoskeletal: She exhibits no edema and no tenderness.  Self propels in [facility]  Neurological: She is alert.  Skin: Skin is warm and dry. She is not diaphoretic.  Psychiatric: She has a normal mood and affect. She exhibits abnormal recent memory.    Labs reviewed: Basic Metabolic Panel:  Recent Labs  66/06/30 2311 12/28/13 0443 12/30/13 0750 04/09/14  NA 140 143 143 138  K 4.1 3.8 3.9 4.0  CL 102 107 109  --   CO2 23 23 21   --   GLUCOSE 131* 107* 94  --   BUN 58* 57* 48* 41*  CREATININE 2.30* 2.35* 1.83* 1.9*  CALCIUM 8.9 8.1* 8.2*  --    Liver Function Tests:  Recent Labs  08/08/13 0740 12/27/13 2311 12/28/13 0443  AST 17 25 16   ALT 7 5 5   ALKPHOS 65 54 46  BILITOT 0.8 0.3 0.2*  PROT 5.2* 7.9 6.8  ALBUMIN 1.5* 2.6* 2.2*    Recent Labs  07/27/13 1130  LIPASE 68*   No results found for this basename: AMMONIA,  in the last 8760 hours CBC:  Recent Labs  07/27/13 1130  08/07/13 1617  12/27/13 2311 12/28/13 0443 12/30/13 0750 05/02/14  WBC 3.7*  < > 6.2  < > 5.4 4.6 3.4* 3.5  NEUTROABS 2.5  --  4.6  --   --  2.9  --   --   HGB 9.4*  < > 8.5*  < > 11.0* 10.0* 9.8* 10.6*  HCT 29.2*  < > 27.2*  < > 33.9* 31.3* 30.8* 32*  MCV 99.3  < > 100.4*  < > 100.9* 100.6* 101.7*  --   PLT 207  < > 189  < > 180 174 162 247  < > = values in this interval not displayed. TSH:  Recent Labs  07/27/13 2254 05/02/14  TSH 5.006* 9.89*   A1C: Lab Results  Component Value Date   HGBA1C  Value: 5.2 (NOTE)   The ADA recommends the following therapeutic goal for glycemic   control related to Hgb A1C measurement:   Goal of Therapy:   < 7.0% Hgb A1C   Reference: American Diabetes Association: Clinical Practice   Recommendations 2008, Diabetes Care,  2008, 31:(Suppl 1). 12/25/2008   Lipid Panel: No results found for this basename: CHOL, HDL, LDLCALC, TRIG,  CHOLHDL, LDLDIRECT,  in  the last 8760 hours    Assessment/Plan  1. Essential hypertension Controlled on current medications    2. Chronic atrial fibrillation -rate controlled at this time, not on anticoagulation due to hem positive stools  3. CKD (chronic kidney disease) stage 4, GFR 15-29 ml/min Will follow up labs  4. Dementia without behavioral disturbance -no changes in behaviors, pt remains at baseline without acute changes in dementia.   5. Hypothyroidism, unspecified hypothyroidism type 7/11 TSH -18.110; dose increased and 8/12 TSH 9.89; on 150 mcg TSH 29 ON 8/28  And synthroid increased 175 MCG with follow up labs; theses have not been obtained therefore will order TSH  6. Anemia, iron deficiency -currently on iron, will follow up CBC and iron levels

## 2014-07-21 DIAGNOSIS — D509 Iron deficiency anemia, unspecified: Secondary | ICD-10-CM | POA: Insufficient documentation

## 2014-08-03 ENCOUNTER — Non-Acute Institutional Stay (SKILLED_NURSING_FACILITY): Payer: Medicare Other | Admitting: Nurse Practitioner

## 2014-08-03 DIAGNOSIS — D509 Iron deficiency anemia, unspecified: Secondary | ICD-10-CM

## 2014-08-03 DIAGNOSIS — E039 Hypothyroidism, unspecified: Secondary | ICD-10-CM

## 2014-08-03 DIAGNOSIS — N184 Chronic kidney disease, stage 4 (severe): Secondary | ICD-10-CM

## 2014-08-03 NOTE — Progress Notes (Signed)
Patient ID: Emily Peters, female   DOB: 1914/11/16, 80 y.o.   MRN: [id number]    Nursing Home Location:  [place] of Service: SNF (31)  PCP: Kennon Holter, MD  No Known Allergies  Chief Complaint  Patient presents with  . Acute Visit    lab review     HPI:  Patient is a 80 y.o. female seen today at [facility], being seen today for review of lab work. No  issues noted by staff, pt remains at baseline.   Review of Systems:  Review of Systems  Unable to perform ROS: Dementia    Past Medical History  Diagnosis Date  . PAC (premature atrial contraction)   . Thyroid disease   . Hypertension   . Dementia   . Cataract   . Acid reflux   . CHF (congestive heart failure)   . Renal disorder   . Hypothyroidism   . Arthritis   . Peripheral vascular disease    Past Surgical History  Procedure Laterality Date  . Abdominal hysterectomy    . Eye surgery    . Esophageal dilation      x 2   Social History:   reports that she has never smoked. She has never used smokeless tobacco. She reports that she does not drink alcohol or use illicit drugs.  Family History  Problem Relation Age of Onset  . Heart failure    . Heart attack      Medications: Patient's Medications  New Prescriptions   No medications on file  Previous Medications   ACETAMINOPHEN (TYLENOL) 325 MG TABLET    Take 650 mg by mouth every 6 (six) hours as needed for mild pain, fever or headache.   ALBUTEROL (PROVENTIL) (2.5 MG/3ML) 0.083% NEBULIZER SOLUTION    Take 3 mLs (2.5 mg total) by nebulization 3 (three) times daily.   AMLODIPINE (NORVASC) 5 MG TABLET    Take 1 tablet (5 mg total) by mouth daily.   ASPIRIN 81 MG TABLET    Take 81 mg by mouth daily.   CHOLECALCIFEROL (VITAMIN D) 1000 UNITS TABLET    Take 2,000 Units by mouth daily.    CYANOCOBALAMIN (VITAMIN B-12 IJ)    Inject 1 application as directed every 30 (thirty) days.    DOCUSATE SODIUM (COLACE) 100 MG  CAPSULE    Take 1 capsule (100 mg total) by mouth 2 (two) times daily.   DONEPEZIL (ARICEPT) 10 MG TABLET    Take 10 mg by mouth at bedtime.   FERROUS SULFATE 325 (65 FE) MG TABLET    Take 325 mg by mouth 3 (three) times daily with meals.   FUROSEMIDE (LASIX) 40 MG TABLET    Take 1 tablet (40 mg total) by mouth daily.   LEVOTHYROXINE (SYNTHROID, LEVOTHROID) 175 MCG TABLET    Take 175 mcg by mouth daily before breakfast.   MAGNESIUM HYDROXIDE (MILK OF MAGNESIA) 400 MG/5ML SUSPENSION    Take 30 mLs by mouth daily as needed for mild constipation.   METOPROLOL (LOPRESSOR) 50 MG TABLET    Take 50 mg by mouth 2 (two) times daily.   OMEPRAZOLE (PRILOSEC) 20 MG CAPSULE    Take 40 mg by mouth daily.   POLYETHYLENE GLYCOL (MIRALAX / GLYCOLAX) PACKET    Take 17 g by mouth daily.   RANITIDINE (ZANTAC) 150 MG TABLET    Take 150 mg by mouth daily.   SKIN PROTECTANTS, MISC. (EUCERIN) CREAM  Apply 1 application topically 3 (three) times daily as needed for dry skin.    TRAMADOL (ULTRAM) 50 MG TABLET    Take one tablet by mouth at bedtime for hip pain  Modified Medications   No medications on file  Discontinued Medications   No medications on file     Physical Exam: Filed Vitals:   08/03/14 1703  BP: 128/74  Pulse: 77  Temp: 97.3 F (36.3 C)  Resp: 20  Weight: 135 lb (61.236 kg)    Physical Exam  Constitutional: No distress.  Frail elderly female in NAD  HENT:  Mouth/Throat: Oropharynx is clear and moist. No oropharyngeal exudate.  Eyes: Conjunctivae and EOM are normal. Pupils are equal, round, and reactive to light.  Neck: Normal range of motion. Neck supple.  Cardiovascular: Normal rate, regular rhythm and normal heart sounds.   Pulmonary/Chest: Effort normal and breath sounds normal.  Abdominal: Soft. Bowel sounds are normal.  Musculoskeletal: She exhibits no edema or tenderness.  Neurological: She is alert.  Skin: Skin is warm and dry. She is not diaphoretic.  Psychiatric: She has a  normal mood and affect. She exhibits abnormal recent memory.    Labs reviewed: Basic Metabolic Panel:  Recent Labs  12/27/13 2311 12/28/13 0443 12/30/13 0750 04/09/14  NA 140 143 143 138  K 4.1 3.8 3.9 4.0  CL 102 107 109  --   CO2 _0 --   GLUCOSE 131* 107* 94  --   BUN 58* 57* 48* 41*  CREATININE 2.30* 2.35* 1.83* 1.9*  CALCIUM 8.9 8.1* 8.2*  --    Liver Function Tests:  Recent Labs  08/08/13 0740 12/27/13 2311 12/28/13 0443  AST _1 ALT _2 ALKPHOS 65 54 46  BILITOT 0.8 0.3 0.2*  PROT 5.2* 7.9 6.8  ALBUMIN 1.5* 2.6* 2.2*   No results for input(s): LIPASE, AMYLASE in the last 8760 hours. No results for input(s): AMMONIA in the last 8760 hours. CBC:  Recent Labs  08/07/13 1617  12/27/13 2311 12/28/13 0443 12/30/13 0750 05/02/14  WBC 6.2  < > 5.4 4.6 3.4* 3.5  NEUTROABS 4.6  --   --  2.9  --   --   HGB 8.5*  < > 11.0* 10.0* 9.8* 10.6*  HCT 27.2*  < > 33.9* 31.3* 30.8* 32*  MCV 100.4*  < > 100.9* 100.6* 101.7*  --   PLT 189  < > 180 174 162 247  < > = values in this interval not displayed. TSH:  Recent Labs  05/02/14  TSH 9.89*   A1C: Lab Results  Component Value Date   HGBA1C  12/25/2008    5.2 (NOTE)   The ADA recommends the following therapeutic goal for glycemic   control related to Hgb A1C measurement:   Goal of Therapy:   < 7.0% Hgb A1C   Reference: American Diabetes Association: Clinical Practice   Recommendations 2008, Diabetes Care,  2008, 31:(Suppl 1).   Lipid Panel: No results for input(s): CHOL, HDL, LDLCALC, TRIG, CHOLHDL, LDLDIRECT in the last 8760 hours.   Iron and IBC    Result: 07/30/2014 3:23 PM   ( Status: F )       Iron 48     42-145 ug/dL SLN   UIBC 140     125-400 ug/dL SLN   TIBC 188   L 250-470 ug/dL SLN   %SAT 26     20-55 % SLN   CBC with  Diff    Result: 07/30/2014 6:04 PM   ( Status: F )       WBC 3.5   L 4.0-10.5 K/uL SLN   RBC 3.27   L 3.87-5.11 MIL/uL SLN   Hemoglobin 10.5   L 12.0-15.0 g/dL SLN     Hematocrit 31.0   L 36.0-46.0 % SLN   MCV 94.8     78.0-100.0 fL SLN   MCH 32.1     26.0-34.0 pg SLN   MCHC 33.9     30.0-36.0 g/dL SLN   RDW 13.5     11.5-15.5 % SLN   Platelet Count 179     150-400 K/uL SLN   Granulocyte % 48     43-77 % SLN   Absolute Gran 1.7     1.7-7.7 K/uL SLN   Lymph % 33     12-46 % SLN   Absolute Lymph 1.2     0.7-4.0 K/uL SLN   Mono % 17   H 3-12 % SLN   Absolute Mono 0.6     0.1-1.0 K/uL SLN   Eos % 2     0-5 % SLN   Absolute Eos 0.1     0.0-0.7 K/uL SLN   Baso % 0     0-1 % SLN   Absolute Baso 0.0     0.0-0.1 K/uL SLN   Smear Review Criteria for review not met  SLN   Basic Metabolic Panel    Result: 07/30/2014 3:23 PM   ( Status: F )       Sodium 141     135-145 mEq/L SLN   Potassium 3.8     3.5-5.3 mEq/L SLN   Chloride 106     96-112 mEq/L SLN   CO2 27     19-32 mEq/L SLN   Glucose 89     70-99 mg/dL SLN   BUN 45   H 6-23 mg/dL SLN   Creatinine 1.79   H 0.50-1.10 mg/dL SLN   Calcium 8.4     8.4-10.5 mg/dL SLN   TSH, Ultrasensitive    Result: 07/30/2014 3:18 PM   ( Status: F )       TSH 0.124   L 0.350-4.500 uIU/mL SLN  Assessment/Plan  1. Anemia, iron deficiency Iron at 48, hgb stable, will cont iron supplements at this time  2. CKD (chronic kidney disease) stage 4, GFR 15-29 ml/min Cr at baseline at 1.75  3. Hypothyroidism, unspecified hypothyroidism type 7/11 TSH -18.110; dose increased and 8/12 TSH 9.89; on 150 mcg TSH 29 ON 8/28  And synthroid increased 175 MCG, lab today revealed low TSH at 0.124, will reduce synthroid to 150 mg and follow up TSH in 2 months

## 2014-08-28 ENCOUNTER — Non-Acute Institutional Stay (SKILLED_NURSING_FACILITY): Payer: Medicare Other | Admitting: Nurse Practitioner

## 2014-08-28 DIAGNOSIS — K219 Gastro-esophageal reflux disease without esophagitis: Secondary | ICD-10-CM

## 2014-08-28 DIAGNOSIS — F0390 Unspecified dementia without behavioral disturbance: Secondary | ICD-10-CM

## 2014-08-28 DIAGNOSIS — I482 Chronic atrial fibrillation, unspecified: Secondary | ICD-10-CM

## 2014-08-28 DIAGNOSIS — I5022 Chronic systolic (congestive) heart failure: Secondary | ICD-10-CM

## 2014-08-28 DIAGNOSIS — E039 Hypothyroidism, unspecified: Secondary | ICD-10-CM

## 2014-08-28 DIAGNOSIS — I10 Essential (primary) hypertension: Secondary | ICD-10-CM

## 2014-08-28 NOTE — Progress Notes (Signed)
Patient ID: Emily Peters, female   DOB: 1914/11/05, 80 y.o.   MRN: [id number]    Nursing Home Location:  [place] of Service: SNF (31)  PCP: Kennon Holter, MD  No Known Allergies  Chief Complaint  Patient presents with  . Medical Management of Chronic Issues    HPI:  Patient is a 80 y.o. female seen today at [place], being seen today for routine follow up on chronic conditions. Pt has been within her usual state of health in the last month. Weights have remains stable. Eating well. No complaints of pains. Pts functional and cognitive status without acute changes.    Review of Systems:  Review of Systems  Unable to perform ROS: Dementia    Past Medical History  Diagnosis Date  . PAC (premature atrial contraction)   . Thyroid disease   . Hypertension   . Dementia   . Cataract   . Acid reflux   . CHF (congestive heart failure)   . Renal disorder   . Hypothyroidism   . Arthritis   . Peripheral vascular disease    Past Surgical History  Procedure Laterality Date  . Abdominal hysterectomy    . Eye surgery    . Esophageal dilation      x 2   Social History:   reports that she has never smoked. She has never used smokeless tobacco. She reports that she does not drink alcohol or use illicit drugs.  Family History  Problem Relation Age of Onset  . Heart failure    . Heart attack      Medications: Patient's Medications  New Prescriptions   No medications on file  Previous Medications   ACETAMINOPHEN (TYLENOL) 325 MG TABLET    Take 650 mg by mouth every 6 (six) hours as needed for mild pain, fever or headache.   ALBUTEROL (PROVENTIL) (2.5 MG/3ML) 0.083% NEBULIZER SOLUTION    Take 3 mLs (2.5 mg total) by nebulization 3 (three) times daily.   AMLODIPINE (NORVASC) 5 MG TABLET    Take 1 tablet (5 mg total) by mouth daily.   ASPIRIN 81 MG TABLET    Take 81 mg by mouth daily.   CHOLECALCIFEROL (VITAMIN D) 1000 UNITS TABLET     Take 2,000 Units by mouth daily.    CYANOCOBALAMIN (VITAMIN B-12 IJ)    Inject 1 application as directed every 30 (thirty) days.    DOCUSATE SODIUM (COLACE) 100 MG CAPSULE    Take 1 capsule (100 mg total) by mouth 2 (two) times daily.   DONEPEZIL (ARICEPT) 10 MG TABLET    Take 10 mg by mouth at bedtime.   FERROUS SULFATE 325 (65 FE) MG TABLET    Take 325 mg by mouth 3 (three) times daily with meals.   FUROSEMIDE (LASIX) 40 MG TABLET    Take 1 tablet (40 mg total) by mouth daily.   LEVOTHYROXINE (SYNTHROID, LEVOTHROID) 150 MCG TABLET    Take 150 mcg by mouth daily before breakfast.   MAGNESIUM HYDROXIDE (MILK OF MAGNESIA) 400 MG/5ML SUSPENSION    Take 30 mLs by mouth daily as needed for mild constipation.   METOPROLOL (LOPRESSOR) 50 MG TABLET    Take 50 mg by mouth 2 (two) times daily.   OMEPRAZOLE (PRILOSEC) 20 MG CAPSULE    Take 40 mg by mouth daily.   POLYETHYLENE GLYCOL (MIRALAX / GLYCOLAX) PACKET    Take 17 g by mouth daily.   RANITIDINE (  ZANTAC) 150 MG TABLET    Take 150 mg by mouth daily.   SKIN PROTECTANTS, MISC. (EUCERIN) CREAM    Apply 1 application topically 3 (three) times daily as needed for dry skin.    TRAMADOL (ULTRAM) 50 MG TABLET    Take one tablet by mouth at bedtime for hip pain  Modified Medications   No medications on file  Discontinued Medications   LEVOTHYROXINE (SYNTHROID, LEVOTHROID) 175 MCG TABLET    Take 175 mcg by mouth daily before breakfast.     Physical Exam: Filed Vitals:   08/28/14 1136  BP: 119/63  Pulse: 60  Temp: 97.6 F (36.4 C)  Resp: 20  Weight: 133 lb (60.328 kg)    Physical Exam  Constitutional: No distress.  Frail elderly female in NAD  HENT:  Mouth/Throat: Oropharynx is clear and moist. No oropharyngeal exudate.  Eyes: Conjunctivae and EOM are normal. Pupils are equal, round, and reactive to light.  Neck: Normal range of motion. Neck supple.  Cardiovascular: Normal rate, regular rhythm and normal heart sounds.   Pulmonary/Chest:  Effort normal and breath sounds normal.  Abdominal: Soft. Bowel sounds are normal.  Musculoskeletal: She exhibits no edema or tenderness.  Neurological: She is alert.  Skin: Skin is warm and dry. She is not diaphoretic.  Psychiatric: She has a normal mood and affect. She exhibits abnormal recent memory.    Labs reviewed: Basic Metabolic Panel:  Recent Labs  12/27/13 2311 12/28/13 0443 12/30/13 0750 04/09/14  NA 140 143 143 138  K 4.1 3.8 3.9 4.0  CL 102 107 109  --   CO2 $Re'23 23 21  'tAC$ --   GLUCOSE 131* 107* 94  --   BUN 58* 57* 48* 41*  CREATININE 2.30* 2.35* 1.83* 1.9*  CALCIUM 8.9 8.1* 8.2*  --    Liver Function Tests:  Recent Labs  12/27/13 2311 12/28/13 0443  AST 25 16  ALT 5 5  ALKPHOS 54 46  BILITOT 0.3 0.2*  PROT 7.9 6.8  ALBUMIN 2.6* 2.2*   No results for input(s): LIPASE, AMYLASE in the last 8760 hours. No results for input(s): AMMONIA in the last 8760 hours. CBC:  Recent Labs  12/27/13 2311 12/28/13 0443 12/30/13 0750 05/02/14  WBC 5.4 4.6 3.4* 3.5  NEUTROABS  --  2.9  --   --   HGB 11.0* 10.0* 9.8* 10.6*  HCT 33.9* 31.3* 30.8* 32*  MCV 100.9* 100.6* 101.7*  --   PLT 180 174 162 247   TSH:  Recent Labs  05/02/14  TSH 9.89*   A1C: Lab Results  Component Value Date   HGBA1C  12/25/2008    5.2 (NOTE)   The ADA recommends the following therapeutic goal for glycemic   control related to Hgb A1C measurement:   Goal of Therapy:   < 7.0% Hgb A1C   Reference: American Diabetes Association: Clinical Practice   Recommendations 2008, Diabetes Care,  2008, 31:(Suppl 1).   Lipid Panel: No results for input(s): CHOL, HDL, LDLCALC, TRIG, CHOLHDL, LDLDIRECT in the last 8760 hours.   Iron and IBC    Result: 07/30/2014 3:23 PM   ( Status: F )       Iron 48     42-145 ug/dL SLN   UIBC 140     125-400 ug/dL SLN   TIBC 188   L 250-470 ug/dL SLN   %SAT 26     20-55 % SLN   CBC with Diff    Result: 07/30/2014 6:04  PM   ( Status: F )       WBC 3.5    L 4.0-10.5 K/uL SLN   RBC 3.27   L 3.87-5.11 MIL/uL SLN   Hemoglobin 10.5   L 12.0-15.0 g/dL SLN   Hematocrit 31.0   L 36.0-46.0 % SLN   MCV 94.8     78.0-100.0 fL SLN   MCH 32.1     26.0-34.0 pg SLN   MCHC 33.9     30.0-36.0 g/dL SLN   RDW 13.5     11.5-15.5 % SLN   Platelet Count 179     150-400 K/uL SLN   Granulocyte % 48     43-77 % SLN   Absolute Gran 1.7     1.7-7.7 K/uL SLN   Lymph % 33     12-46 % SLN   Absolute Lymph 1.2     0.7-4.0 K/uL SLN   Mono % 17   H 3-12 % SLN   Absolute Mono 0.6     0.1-1.0 K/uL SLN   Eos % 2     0-5 % SLN   Absolute Eos 0.1     0.0-0.7 K/uL SLN   Baso % 0     0-1 % SLN   Absolute Baso 0.0     0.0-0.1 K/uL SLN   Smear Review Criteria for review not met  SLN   Basic Metabolic Panel    Result: 07/30/2014 3:23 PM   ( Status: F )       Sodium 141     135-145 mEq/L SLN   Potassium 3.8     3.5-5.3 mEq/L SLN   Chloride 106     96-112 mEq/L SLN   CO2 27     19-32 mEq/L SLN   Glucose 89     70-99 mg/dL SLN   BUN 45   H 6-23 mg/dL SLN   Creatinine 1.79   H 0.50-1.10 mg/dL SLN   Calcium 8.4     8.4-10.5 mg/dL SLN   TSH, Ultrasensitive    Result: 07/30/2014 3:18 PM   ( Status: F )       TSH 0.124   L 0.350-4.500 uIU/mL SLN  Assessment/Plan 1. Essential hypertension Patients hypertension is stable; continue current regimen. Will monitor and make changes as necessary.  2. Chronic systolic congestive heart failure Stable at this time. conts on lasix 40 mg daily and betablocker  3. Chronic atrial fibrillation Rate controlled on metoprolol, no longer on anticoagulant at this time. Was previously on ASA however due to hemo positive stools this was stopped  4. Gastroesophageal reflux disease without esophagitis Controlled   5. Hypothyroidism, unspecified hypothyroidism type Currently taking synthroid 150 mcgs, TSH scheduled in 1 month  6. Dementia without behavioral disturbance Unchanged. conts on aricept

## 2014-09-25 ENCOUNTER — Non-Acute Institutional Stay (SKILLED_NURSING_FACILITY): Payer: Medicare Other | Admitting: Nurse Practitioner

## 2014-09-25 DIAGNOSIS — I482 Chronic atrial fibrillation, unspecified: Secondary | ICD-10-CM

## 2014-09-25 DIAGNOSIS — I5022 Chronic systolic (congestive) heart failure: Secondary | ICD-10-CM

## 2014-09-25 DIAGNOSIS — F0390 Unspecified dementia without behavioral disturbance: Secondary | ICD-10-CM

## 2014-09-25 DIAGNOSIS — I10 Essential (primary) hypertension: Secondary | ICD-10-CM

## 2014-09-25 DIAGNOSIS — E039 Hypothyroidism, unspecified: Secondary | ICD-10-CM

## 2014-09-25 DIAGNOSIS — R112 Nausea with vomiting, unspecified: Secondary | ICD-10-CM

## 2014-09-25 DIAGNOSIS — K219 Gastro-esophageal reflux disease without esophagitis: Secondary | ICD-10-CM

## 2014-09-25 NOTE — Progress Notes (Signed)
Patient ID: Emily Peters, female   DOB: 07-Aug-1915, 81 y.o.   MRN: [id number]    Nursing Home Location:  [place] of Service: SNF (31)  PCP: Kennon Holter, MD  No Known Allergies  Chief Complaint  Patient presents with  . Medical Management of Chronic Issues    HPI:  Patient is a 81 y.o. female seen today at [place], being seen today for routine follow up on chronic conditions. Pt with a pmh of  Hypothyroidism, HTN, dementia, GERD, arthritis, CHF, a fib. Pt reports she ate lunch and has a small episode of vomiting afterwards. Staff was unaware of this. Pt with hx of dementia so ROS is difficult to obtain however she reports some nausea and worsening GERD at time of exam.   Review of Systems:  Review of Systems  Unable to perform ROS: Dementia    Past Medical History  Diagnosis Date  . PAC (premature atrial contraction)   . Thyroid disease   . Hypertension   . Dementia   . Cataract   . Acid reflux   . CHF (congestive heart failure)   . Renal disorder   . Hypothyroidism   . Arthritis   . Peripheral vascular disease    Past Surgical History  Procedure Laterality Date  . Abdominal hysterectomy    . Eye surgery    . Esophageal dilation      x 2   Social History:   reports that she has never smoked. She has never used smokeless tobacco. She reports that she does not drink alcohol or use illicit drugs.  Family History  Problem Relation Age of Onset  . Heart failure    . Heart attack      Medications: Patient's Medications  New Prescriptions   No medications on file  Previous Medications   ACETAMINOPHEN (TYLENOL) 325 MG TABLET    Take 650 mg by mouth every 6 (six) hours as needed for mild pain, fever or headache.   ALBUTEROL (PROVENTIL) (2.5 MG/3ML) 0.083% NEBULIZER SOLUTION    Take 3 mLs (2.5 mg total) by nebulization 3 (three) times daily.   AMLODIPINE (NORVASC) 5 MG TABLET    Take 1 tablet (5 mg total) by mouth daily.     ASPIRIN 81 MG TABLET    Take 81 mg by mouth daily.   CHOLECALCIFEROL (VITAMIN D) 1000 UNITS TABLET    Take 2,000 Units by mouth daily.    CYANOCOBALAMIN (VITAMIN B-12 IJ)    Inject 1 application as directed every 30 (thirty) days.    DOCUSATE SODIUM (COLACE) 100 MG CAPSULE    Take 1 capsule (100 mg total) by mouth 2 (two) times daily.   DONEPEZIL (ARICEPT) 10 MG TABLET    Take 10 mg by mouth at bedtime.   FERROUS SULFATE 325 (65 FE) MG TABLET    Take 325 mg by mouth 3 (three) times daily with meals.   FUROSEMIDE (LASIX) 40 MG TABLET    Take 1 tablet (40 mg total) by mouth daily.   LEVOTHYROXINE (SYNTHROID, LEVOTHROID) 150 MCG TABLET    Take 150 mcg by mouth daily before breakfast.   MAGNESIUM HYDROXIDE (MILK OF MAGNESIA) 400 MG/5ML SUSPENSION    Take 30 mLs by mouth daily as needed for mild constipation.   METOPROLOL (LOPRESSOR) 50 MG TABLET    Take 50 mg by mouth 2 (two) times daily.   OMEPRAZOLE (PRILOSEC) 20 MG CAPSULE    Take 40  mg by mouth daily.   POLYETHYLENE GLYCOL (MIRALAX / GLYCOLAX) PACKET    Take 17 g by mouth daily.   RANITIDINE (ZANTAC) 150 MG TABLET    Take 150 mg by mouth daily.   SKIN PROTECTANTS, MISC. (EUCERIN) CREAM    Apply 1 application topically 3 (three) times daily as needed for dry skin.    TRAMADOL (ULTRAM) 50 MG TABLET    Take one tablet by mouth at bedtime for hip pain  Modified Medications   No medications on file  Discontinued Medications   No medications on file     Physical Exam: Filed Vitals:   09/25/14 1610  BP: 140/70  Pulse: 80  Temp: 97 F (36.1 C)  Resp: 20  Weight: 133 lb (60.328 kg)    Physical Exam  Constitutional: No distress.  Frail elderly female in NAD  HENT:  Mouth/Throat: Oropharynx is clear and moist. No oropharyngeal exudate.  Eyes: Conjunctivae and EOM are normal. Pupils are equal, round, and reactive to light.  Neck: Normal range of motion. Neck supple.  Cardiovascular: Normal rate, regular rhythm and normal heart sounds.    Pulmonary/Chest: Effort normal and breath sounds normal.  Abdominal: Soft. Bowel sounds are normal.  Musculoskeletal: She exhibits no edema or tenderness.  Neurological: She is alert.  Skin: Skin is warm and dry. She is not diaphoretic.  Psychiatric: She has a normal mood and affect. She exhibits abnormal recent memory.    Labs reviewed: Basic Metabolic Panel:  Recent Labs  12/27/13 2311 12/28/13 0443 12/30/13 0750 04/09/14  NA 140 143 143 138  K 4.1 3.8 3.9 4.0  CL 102 107 109  --   CO2 _0 --   GLUCOSE 131* 107* 94  --   BUN 58* 57* 48* 41*  CREATININE 2.30* 2.35* 1.83* 1.9*  CALCIUM 8.9 8.1* 8.2*  --    Liver Function Tests:  Recent Labs  12/27/13 2311 12/28/13 0443  AST 25 16  ALT 5 5  ALKPHOS 54 46  BILITOT 0.3 0.2*  PROT 7.9 6.8  ALBUMIN 2.6* 2.2*   No results for input(s): LIPASE, AMYLASE in the last 8760 hours. No results for input(s): AMMONIA in the last 8760 hours. CBC:  Recent Labs  12/27/13 2311 12/28/13 0443 12/30/13 0750 05/02/14  WBC 5.4 4.6 3.4* 3.5  NEUTROABS  --  2.9  --   --   HGB 11.0* 10.0* 9.8* 10.6*  HCT 33.9* 31.3* 30.8* 32*  MCV 100.9* 100.6* 101.7*  --   PLT 180 174 162 247   TSH:  Recent Labs  05/02/14  TSH 9.89*   A1C: Lab Results  Component Value Date   HGBA1C  12/25/2008    5.2 (NOTE)   The ADA recommends the following therapeutic goal for glycemic   control related to Hgb A1C measurement:   Goal of Therapy:   < 7.0% Hgb A1C   Reference: American Diabetes Association: Clinical Practice   Recommendations 2008, Diabetes Care,  2008, 31:(Suppl 1).   Lipid Panel: No results for input(s): CHOL, HDL, LDLCALC, TRIG, CHOLHDL, LDLDIRECT in the last 8760 hours.   Iron and IBC    Result: 07/30/2014 3:23 PM   ( Status: F )       Iron 48     42-145 ug/dL SLN   UIBC 140     125-400 ug/dL SLN   TIBC 188   L 250-470 ug/dL SLN   %SAT 26     20-55 % SLN  CBC with Diff    Result: 07/30/2014 6:04 PM   ( Status: F )        WBC 3.5   L 4.0-10.5 K/uL SLN   RBC 3.27   L 3.87-5.11 MIL/uL SLN   Hemoglobin 10.5   L 12.0-15.0 g/dL SLN   Hematocrit 31.0   L 36.0-46.0 % SLN   MCV 94.8     78.0-100.0 fL SLN   MCH 32.1     26.0-34.0 pg SLN   MCHC 33.9     30.0-36.0 g/dL SLN   RDW 13.5     11.5-15.5 % SLN   Platelet Count 179     150-400 K/uL SLN   Granulocyte % 48     43-77 % SLN   Absolute Gran 1.7     1.7-7.7 K/uL SLN   Lymph % 33     12-46 % SLN   Absolute Lymph 1.2     0.7-4.0 K/uL SLN   Mono % 17   H 3-12 % SLN   Absolute Mono 0.6     0.1-1.0 K/uL SLN   Eos % 2     0-5 % SLN   Absolute Eos 0.1     0.0-0.7 K/uL SLN   Baso % 0     0-1 % SLN   Absolute Baso 0.0     0.0-0.1 K/uL SLN   Smear Review Criteria for review not met  SLN   Basic Metabolic Panel    Result: 07/30/2014 3:23 PM   ( Status: F )       Sodium 141     135-145 mEq/L SLN   Potassium 3.8     3.5-5.3 mEq/L SLN   Chloride 106     96-112 mEq/L SLN   CO2 27     19-32 mEq/L SLN   Glucose 89     70-99 mg/dL SLN   BUN 45   H 6-23 mg/dL SLN   Creatinine 1.79   H 0.50-1.10 mg/dL SLN   Calcium 8.4     8.4-10.5 mg/dL SLN   TSH, Ultrasensitive    Result: 07/30/2014 3:18 PM   ( Status: F )       TSH 0.124   L 0.350-4.500 uIU/mL SLN  Assessment/Plan  1. Essential hypertension Blood pressure stable at this time  2. Chronic atrial fibrillation Rate controlled. No longer on anticoagulation due to hem positive stools  3. Chronic systolic congestive heart failure Stable, conts on lasix and betablocker. Last labs stable in November   4. Gastroesophageal reflux disease without esophagitis Worsening GERD today, remains on omeprazole 40 mg daily and zantac 150 mg, will have nursing monitor and follow up as needed   5. Hypothyroidism, unspecified hypothyroidism type Follow up TSH scheduled for 09/28/13  6. Dementia without behavioral disturbance Unchanged, conts on aricept  7. Nausea and vomiting, vomiting of unspecified type Exam normal, Staff to  monitor, supportive care, use PRNs if needed Bland diet and advanced as tolerates.

## 2014-09-30 ENCOUNTER — Emergency Department (HOSPITAL_COMMUNITY): Payer: Medicare Other

## 2014-09-30 ENCOUNTER — Encounter (HOSPITAL_COMMUNITY): Payer: Self-pay

## 2014-09-30 DIAGNOSIS — N179 Acute kidney failure, unspecified: Secondary | ICD-10-CM

## 2014-09-30 DIAGNOSIS — E872 Acidosis, unspecified: Secondary | ICD-10-CM

## 2014-09-30 DIAGNOSIS — K219 Gastro-esophageal reflux disease without esophagitis: Secondary | ICD-10-CM | POA: Diagnosis present

## 2014-09-30 DIAGNOSIS — Z8249 Family history of ischemic heart disease and other diseases of the circulatory system: Secondary | ICD-10-CM

## 2014-09-30 DIAGNOSIS — R627 Adult failure to thrive: Secondary | ICD-10-CM

## 2014-09-30 DIAGNOSIS — Z515 Encounter for palliative care: Secondary | ICD-10-CM

## 2014-09-30 DIAGNOSIS — J189 Pneumonia, unspecified organism: Secondary | ICD-10-CM

## 2014-09-30 DIAGNOSIS — E039 Hypothyroidism, unspecified: Secondary | ICD-10-CM | POA: Diagnosis present

## 2014-09-30 DIAGNOSIS — J9600 Acute respiratory failure, unspecified whether with hypoxia or hypercapnia: Secondary | ICD-10-CM | POA: Diagnosis present

## 2014-09-30 DIAGNOSIS — R197 Diarrhea, unspecified: Secondary | ICD-10-CM | POA: Diagnosis present

## 2014-09-30 DIAGNOSIS — I5042 Chronic combined systolic (congestive) and diastolic (congestive) heart failure: Secondary | ICD-10-CM | POA: Diagnosis present

## 2014-09-30 DIAGNOSIS — F419 Anxiety disorder, unspecified: Secondary | ICD-10-CM | POA: Diagnosis present

## 2014-09-30 DIAGNOSIS — R6251 Failure to thrive (child): Secondary | ICD-10-CM | POA: Diagnosis present

## 2014-09-30 DIAGNOSIS — Z66 Do not resuscitate: Secondary | ICD-10-CM | POA: Diagnosis present

## 2014-09-30 DIAGNOSIS — N184 Chronic kidney disease, stage 4 (severe): Secondary | ICD-10-CM | POA: Diagnosis present

## 2014-09-30 DIAGNOSIS — E079 Disorder of thyroid, unspecified: Secondary | ICD-10-CM | POA: Diagnosis present

## 2014-09-30 DIAGNOSIS — J9601 Acute respiratory failure with hypoxia: Secondary | ICD-10-CM | POA: Diagnosis present

## 2014-09-30 DIAGNOSIS — I4891 Unspecified atrial fibrillation: Secondary | ICD-10-CM | POA: Diagnosis present

## 2014-09-30 DIAGNOSIS — R0989 Other specified symptoms and signs involving the circulatory and respiratory systems: Secondary | ICD-10-CM

## 2014-09-30 DIAGNOSIS — F0390 Unspecified dementia without behavioral disturbance: Secondary | ICD-10-CM | POA: Diagnosis present

## 2014-09-30 DIAGNOSIS — I219 Acute myocardial infarction, unspecified: Secondary | ICD-10-CM | POA: Insufficient documentation

## 2014-09-30 DIAGNOSIS — I213 ST elevation (STEMI) myocardial infarction of unspecified site: Secondary | ICD-10-CM | POA: Diagnosis present

## 2014-09-30 DIAGNOSIS — G9340 Encephalopathy, unspecified: Secondary | ICD-10-CM | POA: Diagnosis present

## 2014-09-30 DIAGNOSIS — R9431 Abnormal electrocardiogram [ECG] [EKG]: Secondary | ICD-10-CM | POA: Diagnosis present

## 2014-09-30 DIAGNOSIS — I129 Hypertensive chronic kidney disease with stage 1 through stage 4 chronic kidney disease, or unspecified chronic kidney disease: Secondary | ICD-10-CM | POA: Diagnosis present

## 2014-09-30 DIAGNOSIS — I739 Peripheral vascular disease, unspecified: Secondary | ICD-10-CM | POA: Diagnosis present

## 2014-09-30 LAB — URINE MICROSCOPIC-ADD ON

## 2014-09-30 LAB — COMPREHENSIVE METABOLIC PANEL
ALBUMIN: 2.5 g/dL — AB (ref 3.5–5.2)
ALK PHOS: 68 U/L (ref 39–117)
ALT: 17 U/L (ref 0–35)
AST: 29 U/L (ref 0–37)
Anion gap: 18 — ABNORMAL HIGH (ref 5–15)
BILIRUBIN TOTAL: 0.6 mg/dL (ref 0.3–1.2)
BUN: 136 mg/dL — ABNORMAL HIGH (ref 6–23)
CHLORIDE: 110 meq/L (ref 96–112)
CO2: 14 mmol/L — ABNORMAL LOW (ref 19–32)
CREATININE: 5.56 mg/dL — AB (ref 0.50–1.10)
Calcium: 8.5 mg/dL (ref 8.4–10.5)
GFR calc Af Amer: 7 mL/min — ABNORMAL LOW (ref >90)
GFR calc non Af Amer: 6 mL/min — ABNORMAL LOW (ref >90)
GLUCOSE: 136 mg/dL — AB (ref 70–99)
Potassium: 4.5 mmol/L (ref 3.5–5.1)
Sodium: 142 mmol/L (ref 135–145)
Total Protein: 7.3 g/dL (ref 6.0–8.3)

## 2014-09-30 LAB — I-STAT ARTERIAL BLOOD GAS, ED
Acid-base deficit: 11 mmol/L — ABNORMAL HIGH (ref 0.0–2.0)
BICARBONATE: 12.8 meq/L — AB (ref 20.0–24.0)
O2 Saturation: 98 %
PH ART: 7.366 (ref 7.350–7.450)
Patient temperature: 98.6
TCO2: 13 mmol/L (ref 0–100)
pCO2 arterial: 22.3 mmHg — ABNORMAL LOW (ref 35.0–45.0)
pO2, Arterial: 112 mmHg — ABNORMAL HIGH (ref 80.0–100.0)

## 2014-09-30 LAB — URINALYSIS, ROUTINE W REFLEX MICROSCOPIC
Bilirubin Urine: NEGATIVE
Glucose, UA: NEGATIVE mg/dL
Ketones, ur: NEGATIVE mg/dL
Nitrite: NEGATIVE
PH: 5 (ref 5.0–8.0)
PROTEIN: NEGATIVE mg/dL
SPECIFIC GRAVITY, URINE: 1.018 (ref 1.005–1.030)
UROBILINOGEN UA: 0.2 mg/dL (ref 0.0–1.0)

## 2014-09-30 LAB — CBC WITH DIFFERENTIAL/PLATELET
Basophils Absolute: 0 10*3/uL (ref 0.0–0.1)
Basophils Relative: 0 % (ref 0–1)
EOS ABS: 0 10*3/uL (ref 0.0–0.7)
Eosinophils Relative: 0 % (ref 0–5)
HEMATOCRIT: 41.5 % (ref 36.0–46.0)
Hemoglobin: 13.3 g/dL (ref 12.0–15.0)
LYMPHS ABS: 0.9 10*3/uL (ref 0.7–4.0)
Lymphocytes Relative: 17 % (ref 12–46)
MCH: 31.5 pg (ref 26.0–34.0)
MCHC: 32 g/dL (ref 30.0–36.0)
MCV: 98.3 fL (ref 78.0–100.0)
MONOS PCT: 17 % — AB (ref 3–12)
Monocytes Absolute: 0.9 10*3/uL (ref 0.1–1.0)
NEUTROS PCT: 66 % (ref 43–77)
Neutro Abs: 3.7 10*3/uL (ref 1.7–7.7)
PLATELETS: 74 10*3/uL — AB (ref 150–400)
RBC: 4.22 MIL/uL (ref 3.87–5.11)
RDW: 13.3 % (ref 11.5–15.5)
WBC MORPHOLOGY: INCREASED
WBC: 5.5 10*3/uL (ref 4.0–10.5)

## 2014-09-30 LAB — I-STAT TROPONIN, ED: Troponin i, poc: 12.33 ng/mL (ref 0.00–0.08)

## 2014-09-30 LAB — I-STAT CG4 LACTIC ACID, ED: Lactic Acid, Venous: 2.38 mmol/L — ABNORMAL HIGH (ref 0.5–2.2)

## 2014-09-30 LAB — PROTIME-INR
INR: 1.77 — ABNORMAL HIGH (ref 0.00–1.49)
PROTHROMBIN TIME: 20.7 s — AB (ref 11.6–15.2)

## 2014-09-30 LAB — AMMONIA: Ammonia: 52 umol/L — ABNORMAL HIGH (ref 11–32)

## 2014-09-30 MED ORDER — HEPARIN BOLUS VIA INFUSION
2000.0000 [IU] | Freq: Once | INTRAVENOUS | Status: AC
  Administered 2014-09-30: 2000 [IU] via INTRAVENOUS
  Filled 2014-09-30: qty 2000

## 2014-09-30 MED ORDER — METOPROLOL TARTRATE 50 MG PO TABS
75.0000 mg | ORAL_TABLET | Freq: Two times a day (BID) | ORAL | Status: DC
  Administered 2014-09-30: 75 mg via ORAL
  Filled 2014-09-30 (×8): qty 1
  Filled 2014-09-30: qty 3
  Filled 2014-09-30 (×4): qty 1

## 2014-09-30 MED ORDER — SODIUM CHLORIDE 0.9 % IV BOLUS (SEPSIS)
1800.0000 mL | Freq: Once | INTRAVENOUS | Status: AC
  Administered 2014-09-30: 1800 mL via INTRAVENOUS

## 2014-09-30 MED ORDER — MORPHINE SULFATE 2 MG/ML IJ SOLN
2.0000 mg | INTRAMUSCULAR | Status: DC | PRN
  Administered 2014-09-30 – 2014-10-06 (×18): 2 mg via INTRAVENOUS
  Filled 2014-09-30 (×18): qty 1

## 2014-09-30 MED ORDER — DEXTROSE 5 % IV SOLN
1.0000 g | Freq: Once | INTRAVENOUS | Status: AC
  Administered 2014-09-30: 1 g via INTRAVENOUS
  Filled 2014-09-30: qty 1

## 2014-09-30 MED ORDER — VANCOMYCIN HCL IN DEXTROSE 1-5 GM/200ML-% IV SOLN
1000.0000 mg | Freq: Once | INTRAVENOUS | Status: DC
  Administered 2014-09-30: 1000 mg via INTRAVENOUS
  Filled 2014-09-30: qty 200

## 2014-09-30 MED ORDER — METRONIDAZOLE 500 MG PO TABS
500.0000 mg | ORAL_TABLET | Freq: Once | ORAL | Status: AC
  Administered 2014-09-30: 500 mg via ORAL
  Filled 2014-09-30: qty 1

## 2014-09-30 MED ORDER — ACETAMINOPHEN 325 MG PO TABS
650.0000 mg | ORAL_TABLET | Freq: Four times a day (QID) | ORAL | Status: DC | PRN
Start: 2014-09-30 — End: 2014-10-06

## 2014-09-30 MED ORDER — LORAZEPAM 2 MG/ML IJ SOLN
0.5000 mg | INTRAMUSCULAR | Status: DC | PRN
  Administered 2014-10-01 – 2014-10-05 (×7): 0.5 mg via INTRAVENOUS
  Filled 2014-09-30 (×7): qty 1

## 2014-09-30 MED ORDER — ASPIRIN 300 MG RE SUPP
300.0000 mg | Freq: Every day | RECTAL | Status: DC
  Filled 2014-09-30 (×7): qty 1

## 2014-09-30 MED ORDER — ASPIRIN 81 MG PO CHEW
324.0000 mg | CHEWABLE_TABLET | Freq: Once | ORAL | Status: AC
  Administered 2014-09-30: 324 mg via ORAL
  Filled 2014-09-30: qty 4

## 2014-09-30 MED ORDER — HEPARIN (PORCINE) IN NACL 100-0.45 UNIT/ML-% IJ SOLN
800.0000 [IU]/h | INTRAMUSCULAR | Status: DC
  Administered 2014-09-30: 800 [IU]/h via INTRAVENOUS
  Filled 2014-09-30: qty 250

## 2014-09-30 MED ORDER — ACETAMINOPHEN 650 MG RE SUPP: 650.0000 mg | Freq: Four times a day (QID) | RECTAL | Status: DC | PRN

## 2014-09-30 MED ORDER — LOPERAMIDE HCL 2 MG PO CAPS: 4.0000 mg | ORAL_CAPSULE | ORAL | Status: DC | PRN

## 2014-09-30 MED ORDER — SODIUM CHLORIDE 0.9 % IV BOLUS (SEPSIS)
500.0000 mL | Freq: Once | INTRAVENOUS | Status: AC
  Administered 2014-09-30: 500 mL via INTRAVENOUS

## 2014-09-30 NOTE — ED Notes (Signed)
Spoke to Dr. Arbutus Leas about pt.s having heparin infusing and pt to be admitted to Med Surg.  Dt. Tat. Stated, "I am going to DC the heparin infusion."

## 2014-09-30 NOTE — Progress Notes (Signed)
Pt admitted to [facility] from the ED.  Pt is on Enteric precautions for possible cdiff.  Pt is from [place].  Family updated on pt condition and dept.  Comfort cart ordered for family.  Pt given Morphine 2 mg IV for pain.  O2 on [place] at 3L.  Pt had a SQ IV in the left lower abd quadrant from [facility] which was discontinued (consulted IV team about it and since pt has Peripheral IVs x 2, OK to DC).

## 2014-09-30 NOTE — ED Provider Notes (Signed)
CSN: [id number]     Arrival date & time 10/19/2014  0830 History   First MD Initiated Contact with Patient 10/02/2014 0831     Chief Complaint  Patient presents with  . Altered Mental Status   level V caveat: Altered mental status HPI Patient presents to the emergency room for evaluation of altered mental status. The patient is a resident of [facility]. According to EMS reports the patient has had diminished mental status from over the past week. The patient has been having active diarrhea. She has C. difficile tests pending. They have been treating the patient with subcutaneous and or IV fluid infusions.  The patient is unable to provide any history. She does not respond when spoken to. The patient is DO NOT RESUSCITATE. Past Medical History  Diagnosis Date  . PAC (premature atrial contraction)   . Thyroid disease   . Hypertension   . Dementia   . Cataract   . Acid reflux   . CHF (congestive heart failure)   . Renal disorder   . Hypothyroidism   . Arthritis   . Peripheral vascular disease    Past Surgical History  Procedure Laterality Date  . Abdominal hysterectomy    . Eye surgery    . Esophageal dilation      x 2   Family History  Problem Relation Age of Onset  . Heart failure    . Heart attack     History  Substance Use Topics  . Smoking status: Never Smoker   . Smokeless tobacco: Never Used  . Alcohol Use: No   OB History    No data available     Review of Systems  Unable to perform ROS     Allergies  Review of patient's allergies indicates no known allergies.  Home Medications   Prior to Admission medications   Medication Sig Start Date End Date Taking? Authorizing Provider  acetaminophen (TYLENOL) 325 MG tablet Take 650 mg by mouth every 6 (six) hours as needed for mild pain, fever or headache.   Yes Historical Provider, MD  amLODipine (NORVASC) 5 MG tablet Take 1 tablet (5 mg total) by mouth daily. 01/01/14  Yes Shanker Levora Dredge, MD   cholecalciferol (VITAMIN D) 1000 UNITS tablet Take 2,000 Units by mouth daily.    Yes Historical Provider, MD  Cyanocobalamin (VITAMIN B-12 IJ) Inject 1 application as directed every 30 (thirty) days.    Yes Historical Provider, MD  docusate sodium (COLACE) 100 MG capsule Take 1 capsule (100 mg total) by mouth 2 (two) times daily. 08/10/13  Yes Russella Dar, NP  donepezil (ARICEPT) 10 MG tablet Take 10 mg by mouth at bedtime.   Yes Historical Provider, MD  ferrous sulfate 325 (65 FE) MG tablet Take 325 mg by mouth 3 (three) times daily with meals. 01/13/13  Yes Donnetta Hutching, MD  furosemide (LASIX) 40 MG tablet Take 1 tablet (40 mg total) by mouth daily. 07/31/13  Yes Costin Otelia Sergeant, MD  levothyroxine (SYNTHROID, LEVOTHROID) 150 MCG tablet Take 150 mcg by mouth daily before breakfast.   Yes Historical Provider, MD  loperamide (IMODIUM) 2 MG capsule Take 4 mg by mouth as needed for diarrhea or loose stools.   Yes Historical Provider, MD  magnesium hydroxide (MILK OF MAGNESIA) 400 MG/5ML suspension Take 30 mLs by mouth daily as needed for mild constipation.   Yes Historical Provider, MD  metoprolol (LOPRESSOR) 50 MG tablet Take 50 mg by mouth 2 (two) times daily.  Yes Historical Provider, MD  omeprazole (PRILOSEC) 20 MG capsule Take 40 mg by mouth daily.   Yes Historical Provider, MD  polyethylene glycol (MIRALAX / GLYCOLAX) packet Take 17 g by mouth daily. 08/10/13  Yes Russella Dar, NP  ranitidine (ZANTAC) 150 MG tablet Take 150 mg by mouth daily.   Yes Historical Provider, MD  Skin Protectants, Misc. (EUCERIN) cream Apply 1 application topically 3 (three) times daily as needed for dry skin.    Yes Historical Provider, MD  traMADol (ULTRAM) 50 MG tablet Take one tablet by mouth at bedtime for hip pain 07/06/14  Yes Tiffany L Reed, DO  albuterol (PROVENTIL) (2.5 MG/3ML) 0.083% nebulizer solution Take 3 mLs (2.5 mg total) by nebulization 3 (three) times daily. Patient not taking: Reported on  09/26/2014 01/01/14   Maretta Bees, MD   BP 119/81 mmHg  Pulse 110  Resp 34  SpO2 90% Physical Exam  Constitutional: She appears listless. She has a sickly appearance.  HENT:  Head: Normocephalic and atraumatic.  Right Ear: External ear normal.  Left Ear: External ear normal.  Mouth/Throat: Mucous membranes are dry. No oral lesions.  Eyes: Conjunctivae are normal. Right eye exhibits no discharge. Left eye exhibits no discharge. No scleral icterus.  Eyes are sunken  Neck: Neck supple. No tracheal deviation present.  Cardiovascular: Normal rate, regular rhythm and intact distal pulses.   Pulmonary/Chest: Effort normal and breath sounds normal. No stridor. No respiratory distress. She has no wheezes. She has no rales.  Abdominal: Soft. Bowel sounds are normal. She exhibits no distension. There is no tenderness. There is no rebound and no guarding.  In the left lower quadrant there is a needle in the left anterior abdominal wall appears to be used for clysis  Musculoskeletal: She exhibits no edema or tenderness.  Neurological: She appears listless. She displays atrophy. She displays no tremor. Cranial nerve deficit: no obvious facial droop,  She displays no seizure activity. GCS eye subscore is 4. GCS verbal subscore is 1. GCS motor subscore is 5.  The patient grimaces to pain, she does not answer any questions, does not follow any commands  Skin: Skin is warm and dry. No rash noted.  Skin color and the feet and ankles are mottled, skin is cool  Psychiatric: She has a normal mood and affect.  Nursing note and vitals reviewed.   ED Course  Procedures (including critical care time)  CRITICAL CARE Performed by: WUJWJ,XBJ Total critical care time: 35 Critical care time was exclusive of separately billable procedures and treating other patients. Critical care was necessary to treat or prevent imminent or life-threatening deterioration. Critical care was time spent personally by me on  the following activities: development of treatment plan with patient and/or surrogate as well as nursing, discussions with consultants, evaluation of patient's response to treatment, examination of patient, obtaining history from patient or surrogate, ordering and performing treatments and interventions, ordering and review of laboratory studies, ordering and review of radiographic studies, pulse oximetry and re-evaluation of patient's condition.  Labs Review Labs Reviewed  CBC WITH DIFFERENTIAL - Abnormal; Notable for the following:    Platelets 74 (*)    Monocytes Relative 17 (*)    All other components within normal limits  COMPREHENSIVE METABOLIC PANEL - Abnormal; Notable for the following:    CO2 14 (*)    Glucose, Bld 136 (*)    BUN 136 (*)    Creatinine, Ser 5.56 (*)    Albumin 2.5 (*)  GFR calc non Af Amer 6 (*)    GFR calc Af Amer 7 (*)    Anion gap 18 (*)    All other components within normal limits  PROTIME-INR - Abnormal; Notable for the following:    Prothrombin Time 20.7 (*)    INR 1.77 (*)    All other components within normal limits  AMMONIA - Abnormal; Notable for the following:    Ammonia 52 (*)    All other components within normal limits  I-STAT ARTERIAL BLOOD GAS, ED - Abnormal; Notable for the following:    pCO2 arterial 22.3 (*)    pO2, Arterial 112.0 (*)    Bicarbonate 12.8 (*)    Acid-base deficit 11.0 (*)    All other components within normal limits  I-STAT CG4 LACTIC ACID, ED - Abnormal; Notable for the following:    Lactic Acid, Venous 2.38 (*)    All other components within normal limits  I-STAT TROPOININ, ED - Abnormal; Notable for the following:    Troponin i, poc 12.33 (*)    All other components within normal limits  CULTURE, BLOOD (ROUTINE X 2)  CULTURE, BLOOD (ROUTINE X 2)  URINALYSIS, ROUTINE W REFLEX MICROSCOPIC  HEPARIN LEVEL (UNFRACTIONATED)  CBC  TYPE AND SCREEN    Imaging Review Dg Chest 1 View  10/02/2014   CLINICAL DATA:  One  week history of altered mental status  EXAM: CHEST - 1 VIEW  COMPARISON:  December 27, 2013  FINDINGS: There is underlying emphysema. There is patchy infiltrate in the left base. Elsewhere lungs are clear. Heart is enlarged with pulmonary vascularity within normal limits. No adenopathy. There is evidence of old rib trauma on the right, healed.  IMPRESSION: Left base infiltrate.  Underlying emphysema.  Stable cardiomegaly.   Electronically Signed   By: Bretta Bang M.D.   On: 09/26/2014 10:10   Ct Head Wo Contrast  09/26/2014   CLINICAL DATA:  Altered mental status for 1 week. History of dimension.  EXAM: CT HEAD WITHOUT CONTRAST  TECHNIQUE: Contiguous axial images were obtained from the base of the skull through the vertex without intravenous contrast.  COMPARISON:  CT brain 06/02/2012  FINDINGS: The ventricular system is prominent, unchanged from prior. Periventricular and subcortical white matter hypodensity compatible with chronic small vessel ischemic change. No evidence for acute cortically based infarct or intracranial hemorrhage. No significant interval change partially calcified meningioma adjacent to the right transverse sinus. Orbits are unremarkable. Stable findings suggestive of chronic sphenoid sinusitis. Mastoid air cells are unremarkable. Calvarium is intact.  IMPRESSION: No acute intracranial process.  Generalized cortical atrophy and ventricular dilation, stable.   Electronically Signed   By: Annia Belt M.D.   On: 10/04/2014 10:25     EKG Interpretation   Date/Time:  Sunday September 30 2014 08:49:09 EST Ventricular Rate:  132 PR Interval:    QRS Duration: 100 QT Interval:  329 QTC Calculation: 487 R Axis:   -78 Text Interpretation:  Age not entered, assumed to be  81 years old for  purpose of ECG interpretation Atrial fibrillation Ventricular premature  complex Anterolateral infarct, acute (LAD) , new since prior tracing  Confirmed by Taje Littler  MD-J, Jayline Kilburg ([id number]) on 10/11/2014 9:26:34  AM      Medications  metoprolol tartrate (LOPRESSOR) tablet 75 mg (not administered)  aspirin chewable tablet 324 mg (not administered)  heparin bolus via infusion 2,000 Units (not administered)  heparin ADULT infusion 100 units/mL (25000 units/250 mL) (not administered)  ceFEPIme (MAXIPIME)  1 g in dextrose 5 % 50 mL IVPB (not administered)  vancomycin (VANCOCIN) IVPB 1000 mg/200 mL premix (not administered)  sodium chloride 0.9 % bolus 1,800 mL (not administered)  metroNIDAZOLE (FLAGYL) tablet 500 mg (not administered)  sodium chloride 0.9 % bolus 500 mL (500 mLs Intravenous New Bag/Given 2014-10-23 0921)    MDM   Final diagnoses:  ST elevation myocardial infarction (STEMI), unspecified artery  Metabolic acidosis  Acute renal failure, unspecified acute renal failure type  Healthcare-associated pneumonia  Diarrhea    Reviewed the EKG findings with Dr Rennis Golden at 0930.  Pt's EKG is concerning for acute ischemia.  Not a candidate for acute intervention at this time considering her age and the multiple other issues going on at this time.  Plan on treating medically.    ABG suggests a metabolic acidosis.  Will continue to follow closely.  Family did arrive.  I discussed the preliminary results with them.  They agreed that pt is DNR.  They agree with avoiding aggressive measures like cardiac catheterization .  Lab results show an elevated troponin consistent with her EKG findings.  Pt has had an acute MI.   Labs do show acute renal failure and metabolic acidosis.  IV fluid hydration initiated but will need to monitor for pulmonary edema.  CXR with infiltrate.  Treat for hcap.  Flagyl po ordered.  Possible c dif.  Test pending from NH.  Admit to hospital.  Overall, very poor prognosis.    Linwood Dibbles, MD 10/23/2014 1239

## 2014-09-30 NOTE — ED Notes (Signed)
Pt. Took her medications crushed and in applesauce without any difficulty.

## 2014-09-30 NOTE — Consult Note (Addendum)
CONSULTATION NOTE  Reason for Consult: A-fib, ST elevations  Requesting Physician: Dr. Lynelle Doctor  Cardiologist: Dr. Patty Sermons  HPI: This is a 81 y.o. female with a past medical history significant for hypertension, hypothyroidism, advanced dementia, CK 3, GERD and atrial fibrillation which has been rate controlled. Her last stress test was in 2007 which was nonischemic. She has a history of acute chronic combined systolic and diastolic heart failure. She was last admitted for this in November 2014. Given her advanced dementia it was felt that there was little additional to offer at that time.  She now presents with altered mental status. It is difficult to know what her baseline is however her care providers reported that she's had diminished mental status over the past week. She's also been having active diarrhea. Laboratory work is still pending [facility] EKG shows A. fib with RVR with a slightly wider QRS in the anterolateral leads and 2 mm ST elevation in V4 through V6 and 1 and aVL. She has no complaints of chest pain. Cardiology is asked to evaluate given her abnormalities on EKG.  PMHx:  Past Medical History  Diagnosis Date  . PAC (premature atrial contraction)   . Thyroid disease   . Hypertension   . Dementia   . Cataract   . Acid reflux   . CHF (congestive heart failure)   . Renal disorder   . Hypothyroidism   . Arthritis   . Peripheral vascular disease    Past Surgical History  Procedure Laterality Date  . Abdominal hysterectomy    . Eye surgery    . Esophageal dilation      x 2    FAMHx: Family History  Problem Relation Age of Onset  . Heart failure    . Heart attack      SOCHx:  reports that she has never smoked. She has never used smokeless tobacco. She reports that she does not drink alcohol or use illicit drugs.  ALLERGIES: No Known Allergies  ROS: Review of systems not obtained due to patient factors.  HOME MEDICATIONS: No current  facility-administered medications on file prior to encounter.   Current Outpatient Prescriptions on File Prior to Encounter  Medication Sig Dispense Refill  . acetaminophen (TYLENOL) 325 MG tablet Take 650 mg by mouth every 6 (six) hours as needed for mild pain, fever or headache.    Marland Kitchen amLODipine (NORVASC) 5 MG tablet Take 1 tablet (5 mg total) by mouth daily.    . cholecalciferol (VITAMIN D) 1000 UNITS tablet Take 2,000 Units by mouth daily.     . Cyanocobalamin (VITAMIN B-12 IJ) Inject 1 application as directed every 30 (thirty) days.     Marland Kitchen docusate sodium (COLACE) 100 MG capsule Take 1 capsule (100 mg total) by mouth 2 (two) times daily. 10 capsule 0  . donepezil (ARICEPT) 10 MG tablet Take 10 mg by mouth at bedtime.    . ferrous sulfate 325 (65 FE) MG tablet Take 325 mg by mouth 3 (three) times daily with meals.    . furosemide (LASIX) 40 MG tablet Take 1 tablet (40 mg total) by mouth daily. 30 tablet   . levothyroxine (SYNTHROID, LEVOTHROID) 150 MCG tablet Take 150 mcg by mouth daily before breakfast.    . magnesium hydroxide (MILK OF MAGNESIA) 400 MG/5ML suspension Take 30 mLs by mouth daily as needed for mild constipation.    . metoprolol (LOPRESSOR) 50 MG tablet Take 50 mg by mouth 2 (two) times daily.    Marland Kitchen  omeprazole (PRILOSEC) 20 MG capsule Take 40 mg by mouth daily.    . polyethylene glycol (MIRALAX / GLYCOLAX) packet Take 17 g by mouth daily. 14 each 0  . ranitidine (ZANTAC) 150 MG tablet Take 150 mg by mouth daily.    . Skin Protectants, Misc. (EUCERIN) cream Apply 1 application topically 3 (three) times daily as needed for dry skin.     Marland Kitchen traMADol (ULTRAM) 50 MG tablet Take one tablet by mouth at bedtime for hip pain 30 tablet 5  . albuterol (PROVENTIL) (2.5 MG/3ML) 0.083% nebulizer solution Take 3 mLs (2.5 mg total) by nebulization 3 (three) times daily. (Patient not taking: Reported on 10-11-2014) 75 mL 12   HOSPITAL MEDICATIONS: Scheduled:  VITALS: Blood pressure 119/81,  pulse 110, resp. rate 34, SpO2 90 %.  PHYSICAL EXAM: General appearance: Non-verbal, awake, does not follow commands Neck: no carotid bruit and no JVD Lungs: diminished breath sounds bilaterally Heart: irregularly irregular rhythm Abdomen: scaphoid, soft, non-tender Extremities: cool, mottled extremities, no edema Pulses: distant pedal pulses Skin: cool, mottled legs Neurologic: Mental status: Staring, alert, non-verbal, slight right facial droop Psych: Unable to assess  LABS: Results for orders placed or performed during the hospital encounter of 10-11-14 (from the past 48 hour(s))  CBC WITH DIFFERENTIAL     Status: None (Preliminary result)   Collection Time: Oct 11, 2014  8:51 AM  Result Value Ref Range   WBC 5.5 4.0 - 10.5 K/uL   RBC 4.22 3.87 - 5.11 MIL/uL   Hemoglobin 13.3 12.0 - 15.0 g/dL   HCT 16.1 09.6 - 04.5 %   MCV 98.3 78.0 - 100.0 fL   MCH 31.5 26.0 - 34.0 pg   MCHC 32.0 30.0 - 36.0 g/dL   RDW 40.9 81.1 - 91.4 %   Platelets PENDING 150 - 400 K/uL   Neutrophils Relative % PENDING 43 - 77 %   Neutro Abs PENDING 1.7 - 7.7 K/uL   Band Neutrophils PENDING 0 - 10 %   Lymphocytes Relative PENDING 12 - 46 %   Lymphs Abs PENDING 0.7 - 4.0 K/uL   Monocytes Relative PENDING 3 - 12 %   Monocytes Absolute PENDING 0.1 - 1.0 K/uL   Eosinophils Relative PENDING 0 - 5 %   Eosinophils Absolute PENDING 0.0 - 0.7 K/uL   Basophils Relative PENDING 0 - 1 %   Basophils Absolute PENDING 0.0 - 0.1 K/uL   WBC Morphology PENDING    RBC Morphology PENDING    Smear Review PENDING    nRBC PENDING 0 /100 WBC   Metamyelocytes Relative PENDING %   Myelocytes PENDING %   Promyelocytes Absolute PENDING %   Blasts PENDING %  Protime-INR     Status: Abnormal   Collection Time: 10-11-14  8:51 AM  Result Value Ref Range   Prothrombin Time 20.7 (H) 11.6 - 15.2 seconds   INR 1.77 (H) 0.00 - 1.49  Type and screen     Status: None (Preliminary result)   Collection Time: 2014/10/11  9:05 AM  Result  Value Ref Range   ABO/RH(D) O NEG    Antibody Screen PENDING    Sample Expiration 10/03/2014   I-Stat arterial blood gas, ED ([facility] and [facility] only)     Status: Abnormal   Collection Time: 10-11-14  9:14 AM  Result Value Ref Range   pH, Arterial 7.366 7.350 - 7.450   pCO2 arterial 22.3 (L) 35.0 - 45.0 mmHg   pO2, Arterial 112.0 (H) 80.0 - 100.0 mmHg  Bicarbonate 12.8 (L) 20.0 - 24.0 mEq/L   TCO2 13 0 - 100 mmol/L   O2 Saturation 98.0 %   Acid-base deficit 11.0 (H) 0.0 - 2.0 mmol/L   Patient temperature 98.6 F    Collection site RADIAL, ALLEN'S TEST ACCEPTABLE    Drawn by Operator    Sample type ARTERIAL   I-Stat CG4 Lactic Acid, ED     Status: Abnormal   Collection Time: October 27, 2014  9:28 AM  Result Value Ref Range   Lactic Acid, Venous 2.38 (H) 0.5 - 2.2 mmol/L    IMAGING: Dg Chest 1 View  10-27-2014   CLINICAL DATA:  One week history of altered mental status  EXAM: CHEST - 1 VIEW  COMPARISON:  December 27, 2013  FINDINGS: There is underlying emphysema. There is patchy infiltrate in the left base. Elsewhere lungs are clear. Heart is enlarged with pulmonary vascularity within normal limits. No adenopathy. There is evidence of old rib trauma on the right, healed.  IMPRESSION: Left base infiltrate.  Underlying emphysema.  Stable cardiomegaly.   Electronically Signed   By: Bretta Bang M.D.   On: October 27, 2014 10:10    HOSPITAL DIAGNOSES: Active Problems:   Failure to thrive (0-17)   Atrial fibrillation with rapid ventricular response   ST-segment elevation   RECOMMENDATION: 1. Atrial fibrillation with rapid ventricular response-would recommend increasing Lopressor to 75 mg twice a day as tolerated with the first dose this morning. This could be due to ischemia, and I would recommend checking troponins. 2. ST elevation- mostly in the lateral and high lateral leads but also inferiorly. This could be related to significant electrolyte abnormality or potentially coronary ischemia. She's not  complaining of any chest pain. I would recommend presumptive heparinization and will go ahead and check a 2-D echocardiogram. Give ASA 324 mg chewable now. She's not a candidate for invasive cardiac catheterization. Plan is medical therapy. Her family indicates that she is DO NOT RESUSCITATE. 3. Failure to thrive- progressive decline over the past week with difficulty speaking and altered mental status. This could be metabolic, related to a CNS process such as stroke or infection, or possibly related to acute MI. Agree with internal medicine evaluation and continued workup of multiple medical problems.  DO NOT RESUSCITATE.  Thank you for consult and cardiology will follow along with you.  Time Spent Directly with Patient: 30 minutes  Chrystie Nose, MD, [facility] Attending Cardiologist [facility]  HILTY,Kenneth C 27-Oct-2014, 10:25 AM

## 2014-09-30 NOTE — ED Notes (Signed)
admitting MD at the bedside

## 2014-09-30 NOTE — H&P (Signed)
History and Physical  Emily Peters WUX:[id number] DOB: 07/24/1915 DOA: 10/04/2014   PCP: Fredderick Severance, MD   Chief Complaint: Mental status change  HPI:  81 year old female with a history of hypertension, dementia, CKD stage IV, atrial fibrillation, CHF, hypothyroidism presents with one week hx of  altered mental status at her SNF, [facility]. The patient is presently encephalopathic and is unable to provide any history. All of this history is obtained from speaking with EDP, pt's family at bedside and review of medical record.  Apparently, the patient had an episode of emesis on 09/25/2014. She has had a progressive decline in her mental status since that period of time. The patient is unable to verbalize her needs, but the patient's son states that his visits the past week he has also noted decreasing mental status. There've also been reports of diarrhea for the past 1 week without any hematochezia or melena.  In the emergency department, the patient was noted to have atrial fibrillation with RVR with a heart rate 110-120. In addition, the patient was noted to have ST elevation in V4-V6 on EKG as well as I and aVL.  Dr. Rennis Golden was consulted and felt pt was not a candidate to go to the cardiac cath lab, and he started heparin and increased metoprolol. Her point-of-care troponin was 12.33.  She was also found to be in worsening renal failure with a serum creatinine of 5.56. Chest x-ray suggested a left lower lobe opacity. The patient was placed on a nonrebreather due to hypoxemia. Her oxygen saturation was in the low 90s after a nonrebreather was placed. She was given vancomycin, cefepime, and metronidazole and given 2 fluid boluses. Assessment/Plan: Goals of Care -I discussed the patient's critical medical condition with her son, [name] and grandson, Arlys John at the beside -After reviewing benefits, risks and alternatives, the family feels that transitioning patient's focus of care to total comfort was  in the pt's best interest -I will d/c all further lab work and radiographic studies -start morphine IV q 1hr prn anxiety and pain and sob -start ativan IV q 1 hr prn anxiety -d/c all non-essential medications STEMI -transitioning to focus of care to total comfort Acute on chronic renal failure (CKD 4) --transitioning to focus of care to total comfort Acute respiratory Failure --transitioning to focus of care to total comfort Atrial fibrillation with RVR --transitioning to focus of care to total comfort Acute Encephalopathy -multifactorial --transitioning to focus of care to total comfort Dementia  --transitioning to focus of care to total comfort Chronic systolic and diastolic CHF -08/07/13 EF 40-45%, grade 2 diastolic dysfunction     Past Medical History  Diagnosis Date  . PAC (premature atrial contraction)   . Thyroid disease   . Hypertension   . Dementia   . Cataract   . Acid reflux   . CHF (congestive heart failure)   . Renal disorder   . Hypothyroidism   . Arthritis   . Peripheral vascular disease    Past Surgical History  Procedure Laterality Date  . Abdominal hysterectomy    . Eye surgery    . Esophageal dilation      x 2   Social History:  reports that she has never smoked. She has never used smokeless tobacco. She reports that she does not drink alcohol or use illicit drugs.   Family History  Problem Relation Age of Onset  . Heart failure    . Heart attack  No Known Allergies    Prior to Admission medications   Medication Sig Start Date End Date Taking? Authorizing Provider  acetaminophen (TYLENOL) 325 MG tablet Take 650 mg by mouth every 6 (six) hours as needed for mild pain, fever or headache.   Yes Historical Provider, MD  amLODipine (NORVASC) 5 MG tablet Take 1 tablet (5 mg total) by mouth daily. 01/01/14  Yes Shanker Levora Dredge, MD  cholecalciferol (VITAMIN D) 1000 UNITS tablet Take 2,000 Units by mouth daily.    Yes Historical Provider,  MD  Cyanocobalamin (VITAMIN B-12 IJ) Inject 1 application as directed every 30 (thirty) days.    Yes Historical Provider, MD  docusate sodium (COLACE) 100 MG capsule Take 1 capsule (100 mg total) by mouth 2 (two) times daily. 08/10/13  Yes Russella Dar, NP  donepezil (ARICEPT) 10 MG tablet Take 10 mg by mouth at bedtime.   Yes Historical Provider, MD  ferrous sulfate 325 (65 FE) MG tablet Take 325 mg by mouth 3 (three) times daily with meals. 01/13/13  Yes Donnetta Hutching, MD  furosemide (LASIX) 40 MG tablet Take 1 tablet (40 mg total) by mouth daily. 07/31/13  Yes Costin Otelia Sergeant, MD  levothyroxine (SYNTHROID, LEVOTHROID) 150 MCG tablet Take 150 mcg by mouth daily before breakfast.   Yes Historical Provider, MD  loperamide (IMODIUM) 2 MG capsule Take 4 mg by mouth as needed for diarrhea or loose stools.   Yes Historical Provider, MD  magnesium hydroxide (MILK OF MAGNESIA) 400 MG/5ML suspension Take 30 mLs by mouth daily as needed for mild constipation.   Yes Historical Provider, MD  metoprolol (LOPRESSOR) 50 MG tablet Take 50 mg by mouth 2 (two) times daily.   Yes Historical Provider, MD  omeprazole (PRILOSEC) 20 MG capsule Take 40 mg by mouth daily.   Yes Historical Provider, MD  polyethylene glycol (MIRALAX / GLYCOLAX) packet Take 17 g by mouth daily. 08/10/13  Yes Russella Dar, NP  ranitidine (ZANTAC) 150 MG tablet Take 150 mg by mouth daily.   Yes Historical Provider, MD  Skin Protectants, Misc. (EUCERIN) cream Apply 1 application topically 3 (three) times daily as needed for dry skin.    Yes Historical Provider, MD  traMADol (ULTRAM) 50 MG tablet Take one tablet by mouth at bedtime for hip pain 07/06/14  Yes Tiffany L Reed, DO  albuterol (PROVENTIL) (2.5 MG/3ML) 0.083% nebulizer solution Take 3 mLs (2.5 mg total) by nebulization 3 (three) times daily. Patient not taking: Reported on 09/23/2014 01/01/14   Maretta Bees, MD    Review of Systems:  Unobtainable due to pt's  encephalopathy  Physical Exam: Filed Vitals:   10/18/2014 0846 10/03/2014 0915 09/29/2014 0930 09/27/2014 1303  BP: 119/57 119/81 120/62   Pulse: 110     Temp:    99.6 F (37.6 C)  TempSrc:    Rectal  Resp: 32 34 39   SpO2: 90%      General:  Awake and alert,  NAD Head/Eye: No conjunctival hemorrhage, no icterus, [place]/AT, No nystagmus ENT:  No icterus,   no pharyngeal exudate Neck:  No masses, no lymphadenpathy, no meningismus CV:  IRRR, no rub, no gallop, no S3 Lung: Bibasilar crackles. No wheezing. Abdomen: soft/NT, +BS, nondistended, no peritoneal signs Ext: Cool bilateral lower extremities with acrocyanosis. Dorsalis pedis pulses present bilateral.   Labs on Admission:  Basic Metabolic Panel:  Recent Labs Lab 10/11/2014 0851  NA 142  K 4.5  CL 110  CO2 14*  GLUCOSE  136*  BUN 136*  CREATININE 5.56*  CALCIUM 8.5   Liver Function Tests:  Recent Labs Lab Oct 11, 2014 0851  AST 29  ALT 17  ALKPHOS 68  BILITOT 0.6  PROT 7.3  ALBUMIN 2.5*   No results for input(s): LIPASE, AMYLASE in the last 168 hours.  Recent Labs Lab 2014/10/11 0852  AMMONIA 52*   CBC:  Recent Labs Lab Oct 11, 2014 0851  WBC 5.5  NEUTROABS 3.7  HGB 13.3  HCT 41.5  MCV 98.3  PLT 74*   Cardiac Enzymes: No results for input(s): CKTOTAL, CKMB, CKMBINDEX, TROPONINI in the last 168 hours. BNP: Invalid input(s): POCBNP CBG: No results for input(s): GLUCAP in the last 168 hours.  Radiological Exams on Admission: Dg Chest 1 View  October 11, 2014   CLINICAL DATA:  One week history of altered mental status  EXAM: CHEST - 1 VIEW  COMPARISON:  December 27, 2013  FINDINGS: There is underlying emphysema. There is patchy infiltrate in the left base. Elsewhere lungs are clear. Heart is enlarged with pulmonary vascularity within normal limits. No adenopathy. There is evidence of old rib trauma on the right, healed.  IMPRESSION: Left base infiltrate.  Underlying emphysema.  Stable cardiomegaly.   Electronically Signed    By: Bretta Bang M.D.   On: October 11, 2014 10:10   Ct Head Wo Contrast  10-11-14   CLINICAL DATA:  Altered mental status for 1 week. History of dimension.  EXAM: CT HEAD WITHOUT CONTRAST  TECHNIQUE: Contiguous axial images were obtained from the base of the skull through the vertex without intravenous contrast.  COMPARISON:  CT brain 06/02/2012  FINDINGS: The ventricular system is prominent, unchanged from prior. Periventricular and subcortical white matter hypodensity compatible with chronic small vessel ischemic change. No evidence for acute cortically based infarct or intracranial hemorrhage. No significant interval change partially calcified meningioma adjacent to the right transverse sinus. Orbits are unremarkable. Stable findings suggestive of chronic sphenoid sinusitis. Mastoid air cells are unremarkable. Calvarium is intact.  IMPRESSION: No acute intracranial process.  Generalized cortical atrophy and ventricular dilation, stable.   Electronically Signed   By: Annia Belt M.D.   On: Oct 11, 2014 10:25    EKG: Independently reviewed.     Time spent:70 minutes Code Status:   DNR/DNI Family Communication:   Son [name], grandson Arlys John at bedside   Zalyn Amend, DO  [facility] Pager [phone]  If 7PM-7AM, please contact night-coverage [email or web address] Password [email or web address] 10/11/2014, 1:27 PM

## 2014-09-30 NOTE — Progress Notes (Addendum)
ANTICOAGULATION CONSULT NOTE - Initial Consult  Pharmacy Consult for Heparin Indication: atrial fibrillation and possible STEMI  No Known Allergies  Patient Measurements:   Wt Readings from Last 3 Encounters:  09/25/14 133 lb (60.328 kg)  08/28/14 133 lb (60.328 kg)  08/03/14 135 lb (61.236 kg)     Vital Signs: BP: 119/81 mmHg (01/10 0915) Pulse Rate: 110 (01/10 0846)  Labs:  Recent Labs  October 19, 2014 0851  HGB 13.3  HCT 41.5  PLT 74*  LABPROT 20.7*  INR 1.77*  CREATININE 5.56*    CrCl cannot be calculated (Unknown ideal weight.).   Medical History: Past Medical History  Diagnosis Date  . PAC (premature atrial contraction)   . Thyroid disease   . Hypertension   . Dementia   . Cataract   . Acid reflux   . CHF (congestive heart failure)   . Renal disorder   . Hypothyroidism   . Arthritis   . Peripheral vascular disease     Medications:  Infusions:    Assessment: 81 year old female admitted with altered mental status and found to be in atrial fibrillation.  She also has some ST elevation on her EKG.  No intervention is planned.  She is to begin medical therapy for possible STEMI including anticoagulation with heparin.  Note her baseline INR is 1.77 - she is not on any anticoagulation prior to admission - and her baseline PLTC is 74K.  Discussed with Dr. Rennis Golden - these are likely due to shock and anticoagulation is indicated.  Given low platelet count will reduce bolus slightly.  Goal of Therapy:  Heparin level 0.3-0.7 units/ml Monitor platelets by anticoagulation protocol: Yes   Plan:  Heparin 2000 units IV bolus Start Heparin infusion at 800 units/hr Check heparin level and CBC in 8 hours  Estella Husk, Pharm.D., BCPS, AAHIVP Clinical Pharmacist Phone: [phone] or [phone] 10/19/14, 10:59 AM  Consult received to start Vancomycin for pneumonia. Note she is in acute renal failure - SCr 5.56, baseline SCr 1.9.  Plan:  Give Vancomycin 1gm IV x 1  loading dose Follow-up renal function to determine subsequent doses.  Estella Husk, Pharm.D., BCPS, AAHIVP Clinical Pharmacist Phone: [phone] or [phone] Oct 19, 2014, 11:31 AM

## 2014-09-30 NOTE — ED Notes (Signed)
Reviewed contact isolation with family.

## 2014-09-30 NOTE — ED Notes (Signed)
Pt. Having altered LOC for over 1 week.  Pt. Is at [facility] being treated for C-diff, Staff was not knowledgeable of pt.S status.   Pt. Did not speak en route to Paramedics , but did follow them with her eyes.   Hx of dementia.Marland Kitchen

## 2014-10-01 DIAGNOSIS — I231 Atrial septal defect as current complication following acute myocardial infarction: Secondary | ICD-10-CM

## 2014-10-01 LAB — TYPE AND SCREEN
ABO/RH(D): O NEG
Antibody Screen: POSITIVE
DAT, IgG: NEGATIVE

## 2014-10-01 NOTE — Plan of Care (Signed)
Problem: Phase I Progression Outcomes Goal: OOB as tolerated unless otherwise ordered Outcome: Not Met (add Reason) Pt is palliative care

## 2014-10-01 NOTE — Progress Notes (Signed)
   10/01/14 1400  Clinical Encounter Type  Visited With Patient and family together  Visit Type Initial;Patient actively dying  Referral From Physician  Spiritual Encounters  Spiritual Needs Prayer;Grief support  Stress Factors  Family Stress Factors Loss   Chaplain was referred to patient via spiritual care consult. Patient was being visited by her son and his wife when chaplain arrived. Patient's son said they have had a few pastors of the patient come by to visit and offer support. Patient's son described the patient as a strong [place] woman who lived a life of dedicated service and inspired many. Patient's son spoke fondly of the patient and seemed to accept that she was actively dying. Patient's son indicated that another one of the patient's pastors is coming in from [place] later today. Chaplain will continue to provide emotional and spiritual support for patient and patient's family as needed. Dara Hoyer  Pager: [phone] 2:23 PM

## 2014-10-01 NOTE — Progress Notes (Signed)
Patient is transitioning to comfort care. Echocardiogram and heparin cancelled by IM. She is not a invasive workup candidate given advanced age, comorbidities, and advanced dementia. Continue medication therapy. Cardiology will sign off. Please call with any question.  Ramond Dial PA Pager: [phone]

## 2014-10-01 NOTE — Progress Notes (Signed)
PROGRESS NOTE  Emily Peters MVH:[id number] DOB: 1915/06/11 DOA: 09/29/2014 PCP: Fredderick Severance, MD  HPI: 81 year old female with a history of hypertension, dementia, CKD stage IV, atrial fibrillation, CHF, hypothyroidism presents with one week hx of altered mental status at her SNF, [facility]. The patient is presently encephalopathic and is unable to provide any history. All of this history is obtained from speaking with EDP, pt's family at bedside and review of medical record. Apparently, the patient had an episode of emesis on 09/25/2014. She has had a progressive decline in her mental status since that period of time. The patient is unable to verbalize her needs, but the patient's son states that his visits the past week he has also noted decreasing mental status. There've also been reports of diarrhea for the past 1 week without any hematochezia or melena. In the emergency department, the patient was noted to have atrial fibrillation with RVR with a heart rate 110-120. In addition, the patient was noted to have ST elevation in V4-V6 on EKG as well as I and aVL. Dr. Rennis Golden was consulted and felt pt was not a candidate to go to the cardiac cath lab, and he started heparin and increased metoprolol. Her point-of-care troponin was 12.33. She was also found to be in worsening renal failure with a serum creatinine of 5.56. Chest x-ray suggested a left lower lobe opacity. The patient was placed on a nonrebreather due to hypoxemia. Her oxygen saturation was in the low 90s after a nonrebreather was placed. She was given vancomycin, cefepime, and metronidazole and given 2 fluid boluses.  Subjective / 24 H Interval events Unresponsive this morning, shallow breathing   Assessment/Plan: Active Problems:   Failure to thrive (0-17)   Atrial fibrillation with rapid ventricular response   ST-segment elevation   Acute respiratory failure   Acute renal failure superimposed on stage 4 chronic kidney  disease   Goals of Care - Dr. Arbutus Leas on admission as well as this MD discussed the patient's critical medical condition with her family at bedside, she has multiple underlying medical problems and per family's whishes, after reviewing benefits, risks and alternatives, the family feels that transitioning patient's focus of care to total comfort was in the pt's best interest - morphine IV q 1hr prn anxiety and pain and sob - ativan IV q 1 hr prn anxiety - patient with shallow breathing, not responsive  STEMI - transitioning to focus of care to total comfort, not a candidate for aggressive intervensions Acute on chronic renal failure (CKD 4) - transitioning to focus of care to total comfort Acute respiratory Failure - transitioning to focus of care to total comfort Atrial fibrillation with RVR - transitioning to focus of care to total comfort Acute Encephalopathy - multifactorial - transitioning to focus of care to total comfort Dementia - transitioning to focus of care to total comfort Chronic systolic and diastolic CHF - 08/07/13 EF 40-45%, grade 2 diastolic dysfunction   Diet: Diet regular Fluids: KVO  Code Status: DNR Family Communication: discussed with daughter bedside  Disposition Plan: remain inpatient, expected to pass today   Consultants:  None   Procedures:  None    Antibiotics None    Studies  Dg Chest 1 View  10/19/2014   CLINICAL DATA:  One week history of altered mental status  EXAM: CHEST - 1 VIEW  COMPARISON:  December 27, 2013  FINDINGS: There is underlying emphysema. There is patchy infiltrate in the left base. Elsewhere lungs are clear. Heart  is enlarged with pulmonary vascularity within normal limits. No adenopathy. There is evidence of old rib trauma on the right, healed.  IMPRESSION: Left base infiltrate.  Underlying emphysema.  Stable cardiomegaly.   Electronically Signed   By: Bretta Bang M.D.   On: 13-Oct-2014 10:10   Ct Head Wo Contrast  10/13/2014    CLINICAL DATA:  Altered mental status for 1 week. History of dimension.  EXAM: CT HEAD WITHOUT CONTRAST  TECHNIQUE: Contiguous axial images were obtained from the base of the skull through the vertex without intravenous contrast.  COMPARISON:  CT brain 06/02/2012  FINDINGS: The ventricular system is prominent, unchanged from prior. Periventricular and subcortical white matter hypodensity compatible with chronic small vessel ischemic change. No evidence for acute cortically based infarct or intracranial hemorrhage. No significant interval change partially calcified meningioma adjacent to the right transverse sinus. Orbits are unremarkable. Stable findings suggestive of chronic sphenoid sinusitis. Mastoid air cells are unremarkable. Calvarium is intact.  IMPRESSION: No acute intracranial process.  Generalized cortical atrophy and ventricular dilation, stable.   Electronically Signed   By: Annia Belt M.D.   On: 13-Oct-2014 10:25    Objective  Filed Vitals:   10/13/14 2228 10/01/14 0651 10/01/14 0900 10/01/14 1355  BP:  100/58  94/70  Pulse:  109  97  Temp:  97.5 F (36.4 C)  97.3 F (36.3 C)  TempSrc:  Oral  Oral  Resp:  18  19  Height:    (1.575 m)   Weight:   58.968 kg (130 lb)   SpO2: 88%   100%    Intake/Output Summary (Last 24 hours) at 10/01/14 1435 Last data filed at 10/01/14 1610  Gross per 24 hour  Intake    120 ml  Output      0 ml  Net    120 ml   Filed Weights   October 13, 2014 2012 10/01/14 0900  Weight: 58.968 kg (130 lb) 58.968 kg (130 lb)    Exam:  General:  unresponsive  Cardiovascular: regular   Data Reviewed: Basic Metabolic Panel:  Recent Labs Lab Oct 13, 2014 0851  NA 142  K 4.5  CL 110  CO2 14*  GLUCOSE 136*  BUN 136*  CREATININE 5.56*  CALCIUM 8.5   Liver Function Tests:  Recent Labs Lab October 13, 2014 0851  AST 29  ALT 17  ALKPHOS 68  BILITOT 0.6  PROT 7.3  ALBUMIN 2.5*    Recent Labs Lab 2014-10-13 0852  AMMONIA 52*   CBC:  Recent  Labs Lab October 13, 2014 0851  WBC 5.5  NEUTROABS 3.7  HGB 13.3  HCT 41.5  MCV 98.3  PLT 74*    Recent Results (from the past 240 hour(s))  Culture, blood (routine x 2)     Status: None (Preliminary result)   Collection Time: 10/13/2014  8:53 AM  Result Value Ref Range Status   Specimen Description BLOOD LEFT WRIST  Final   Special Requests BOTTLES DRAWN AEROBIC ONLY 3CC  Final   Culture   Final           BLOOD CULTURE RECEIVED NO GROWTH TO DATE CULTURE WILL BE HELD FOR 5 DAYS BEFORE ISSUING A FINAL NEGATIVE REPORT Performed at [facility]    Report Status PENDING  Incomplete  Culture, blood (routine x 2)     Status: None (Preliminary result)   Collection Time: 10/13/2014  8:58 AM  Result Value Ref Range Status   Specimen Description BLOOD LEFT HAND  Final  Special Requests BOTTLES DRAWN AEROBIC ONLY 2CC  Final   Culture   Final           BLOOD CULTURE RECEIVED NO GROWTH TO DATE CULTURE WILL BE HELD FOR 5 DAYS BEFORE ISSUING A FINAL NEGATIVE REPORT Performed at [facility]    Report Status PENDING  Incomplete     Scheduled Meds: . aspirin  300 mg Rectal Daily  . metoprolol tartrate  75 mg Oral BID   Continuous Infusions:   Pamella Pert, MD [facility] Pager [phone]. If 7 PM - 7 AM, please contact night-coverage at [email or web address], password [facility] 10/01/2014, 2:35 PM  LOS: 1 day

## 2014-10-01 NOTE — Progress Notes (Signed)
Nutrition Brief Note  Chart reviewed. Pt now transitioning to comfort care.  No further nutrition interventions warranted at this time.  Please re-consult as needed.   Kaenan Jake A. Kaleea Penner, RD, LDN, CDE Pager: [phone] After hours Pager: [phone]  

## 2014-10-02 NOTE — Care Management (Signed)
10-02-14 Important message from Medicare given . Davonda Ausley RN BSN   

## 2014-10-02 NOTE — Progress Notes (Signed)
PROGRESS NOTE  Emily Peters BPP:[id number] DOB: 1915-06-01 DOA: Oct 11, 2014 PCP: Fredderick Severance, MD  HPI: 81 year old female with a history of hypertension, dementia, CKD stage IV, atrial fibrillation, CHF, hypothyroidism presents with one week hx of altered mental status at her SNF, [place]. The patient is presently encephalopathic and is unable to provide any history. All of this history is obtained from speaking with EDP, pt's family at bedside and review of medical record. Apparently, the patient had an episode of emesis on 09/25/2014. She has had a progressive decline in her mental status since that period of time. The patient is unable to verbalize her needs, but the patient's son states that his visits the past week he has also noted decreasing mental status. There've also been reports of diarrhea for the past 1 week without any hematochezia or melena. In the emergency department, the patient was noted to have atrial fibrillation with RVR with a heart rate 110-120. In addition, the patient was noted to have ST elevation in V4-V6 on EKG as well as I and aVL. Dr. Rennis Golden was consulted and felt pt was not a candidate to go to the cardiac cath lab, and he started heparin and increased metoprolol. Her point-of-care troponin was 12.33. She was also found to be in worsening renal failure with a serum creatinine of 5.56. Chest x-ray suggested a left lower lobe opacity. The patient was placed on a nonrebreather due to hypoxemia. Her oxygen saturation was in the low 90s after a nonrebreather was placed. She was given vancomycin, cefepime, and metronidazole and given 2 fluid boluses.  Subjective / 24 H Interval events Unresponsive this morning, shallow breathing. Renato Gails and his wife present at bedside. Will try and touch base with son later today.  Assessment/Plan: Active Problems:   Failure to thrive (0-17)   Atrial fibrillation with rapid ventricular response   ST-segment elevation   Acute  respiratory failure   Acute renal failure superimposed on stage 4 chronic kidney disease   Goals of Care - Dr. Arbutus Leas on admission as well as Dr. Elvera Lennox discussed the patient's critical medical condition with her family at bedside, she has multiple underlying medical problems and per family's whishes, after reviewing benefits, risks and alternatives, the family feels that transitioning patient's focus of care to total comfort was in the pt's best interest - morphine IV q 1hr prn anxiety and pain and sob - ativan IV q 1 hr prn anxiety - patient with shallow breathing, not responsive  STEMI - transitioning to focus of care to total comfort, not a candidate for aggressive intervensions Acute on chronic renal failure (CKD 4) - transitioning to focus of care to total comfort Acute respiratory Failure - transitioning to focus of care to total comfort Atrial fibrillation with RVR - transitioning to focus of care to total comfort Acute Encephalopathy - multifactorial - transitioning to focus of care to total comfort Dementia - transitioning to focus of care to total comfort Chronic systolic and diastolic CHF - 08/07/13 EF 40-45%, grade 2 diastolic dysfunction   Diet: Diet regular Fluids: KVO  Code Status: DNR Family Communication: discussed with daughter bedside  Disposition Plan: anticipate hospital death  Consultants:  None   Procedures:  None    Antibiotics None    Studies  No results found.  Objective  Filed Vitals:   10/01/14 0651 10/01/14 0900 10/01/14 1355 10/02/14 0544  BP: 100/58  94/70 103/71  Pulse: 109  97 113  Temp: 97.5 F (36.4 C)  97.3  F (36.3 C) 96.8 F (36 C)  TempSrc: Oral  Oral Axillary  Resp: Height:   (1.575 m)    Weight:  58.968 kg (130 lb)    SpO2:   100% 100%   No intake or output data in the 24 hours ending 10/02/14 1042 Filed Weights   09/26/2014 2012 10/01/14 0900  Weight: 58.968 kg (130 lb) 58.968 kg (130 lb)     Exam:  General:  unresponsive  Cardiovascular: regular   Data Reviewed: Basic Metabolic Panel:  Recent Labs Lab 10/02/2014 0851  NA 142  K 4.5  CL 110  CO2 14*  GLUCOSE 136*  BUN 136*  CREATININE 5.56*  CALCIUM 8.5   Liver Function Tests:  Recent Labs Lab 09/22/2014 0851  AST 29  ALT 17  ALKPHOS 68  BILITOT 0.6  PROT 7.3  ALBUMIN 2.5*    Recent Labs Lab 10/15/2014 0852  AMMONIA 52*   CBC:  Recent Labs Lab 10/12/2014 0851  WBC 5.5  NEUTROABS 3.7  HGB 13.3  HCT 41.5  MCV 98.3  PLT 74*    Recent Results (from the past 240 hour(s))  Culture, blood (routine x 2)     Status: None (Preliminary result)   Collection Time: 10/16/2014  8:53 AM  Result Value Ref Range Status   Specimen Description BLOOD LEFT WRIST  Final   Special Requests BOTTLES DRAWN AEROBIC ONLY 3CC  Final   Culture   Final           BLOOD CULTURE RECEIVED NO GROWTH TO DATE CULTURE WILL BE HELD FOR 5 DAYS BEFORE ISSUING A FINAL NEGATIVE REPORT Performed at [facility]    Report Status PENDING  Incomplete  Culture, blood (routine x 2)     Status: None (Preliminary result)   Collection Time: 10/04/2014  8:58 AM  Result Value Ref Range Status   Specimen Description BLOOD LEFT HAND  Final   Special Requests BOTTLES DRAWN AEROBIC ONLY 2CC  Final   Culture   Final           BLOOD CULTURE RECEIVED NO GROWTH TO DATE CULTURE WILL BE HELD FOR 5 DAYS BEFORE ISSUING A FINAL NEGATIVE REPORT Performed at [facility]    Report Status PENDING  Incomplete     Scheduled Meds: . aspirin  300 mg Rectal Daily  . metoprolol tartrate  75 mg Oral BID   Continuous Infusions:   Peggye Pitt, MD [facility] Pager [phone]  If 7 PM - 7 AM, please contact night-coverage at [email or web address], password [facility] 10/02/2014, 10:42 AM  LOS: 2 days

## 2014-10-03 DIAGNOSIS — N179 Acute kidney failure, unspecified: Secondary | ICD-10-CM

## 2014-10-03 DIAGNOSIS — Z515 Encounter for palliative care: Secondary | ICD-10-CM

## 2014-10-03 NOTE — Consult Note (Signed)
I have assessed Emily Peters - no family at bedside. She is becoming more hypothermic and more hypotensive. I did not observe any apneic episodes and lungs do sound clear at this time but diminished in bases. Fingers and legs up to shins are mottled/cyanotic and cool to touch. Her heart rate is also irregular and tachycardic which seems to be a change as of yesterday according to documentation. She could potentially move to hospice facility but I fear she may decline/pass in the time this could be arranged. I also do not believe she has the symptoms that need aggressive management to qualify her for GIP at this time. She is on 3.5L [place] oxygen which she does not seem to need for comfort and I would consider decreasing or removing.   Yong Channel, NP Palliative Medicine Team Pager # [phone] (M-F 8a-5p) Team Phone # [phone] (Nights/Weekends)

## 2014-10-03 NOTE — Progress Notes (Signed)
PROGRESS NOTE  Emily Peters JXB:[id number] DOB: [birth year] DOA: 09/28/2014 PCP: Fredderick Severance, MD  HPI: 81 year old female with a history of hypertension, dementia, CKD stage IV, atrial fibrillation, CHF, hypothyroidism presents with one week hx of altered mental status at her SNF, [place]. The patient is presently encephalopathic and is unable to provide any history. All of this history is obtained from speaking with EDP, pt's family at bedside and review of medical record. Apparently, the patient had an episode of emesis on 09/25/2014. She has had a progressive decline in her mental status since that period of time. The patient is unable to verbalize her needs, but the patient's son states that his visits the past week he has also noted decreasing mental status. There've also been reports of diarrhea for the past 1 week without any hematochezia or melena. In the emergency department, the patient was noted to have atrial fibrillation with RVR with a heart rate 110-120. In addition, the patient was noted to have ST elevation in V4-V6 on EKG as well as I and aVL. Dr. Rennis Golden was consulted and felt pt was not a candidate to go to the cardiac cath lab, and he started heparin and increased metoprolol. Her point-of-care troponin was 12.33. She was also found to be in worsening renal failure with a serum creatinine of 5.56. Chest x-ray suggested a left lower lobe opacity. The patient was placed on a nonrebreather due to hypoxemia. Her oxygen saturation was in the low 90s after a nonrebreather was placed. She was given vancomycin, cefepime, and metronidazole and given 2 fluid boluses.  Subjective / 24 H Interval events Unresponsive this morning, shallow breathing. Renato Gails and his wife present at bedside. Will try and touch base with son later today.  Assessment/Plan:   Goals of Care - Dr. Arbutus Leas on admission as well as Dr. Elvera Lennox discussed the patient's critical medical condition with her family at bedside,  she has multiple underlying medical problems and per family's whishes, after reviewing benefits, risks and alternatives, the family feels that transitioning patient's focus of care to total comfort was in the pt's best interest - morphine IV q 1hr prn anxiety and pain and sob,  ativan IV q 1 hr prn anxiety, Pall care called.    STEMI - transitioning to focus of care to total comfort, not a candidate for aggressive intervensions  Acute on chronic renal failure (CKD 4) - transitioning to focus of care to total comfort  Acute respiratory Failure - transitioning to focus of care to total comfort  Atrial fibrillation with RVR - transitioning to focus of care to total comfort  Acute Encephalopathy - multifactorial,  transitioning to focus of care to total comfort  Dementia - transitioning to focus of care to total comfort  Chronic systolic and diastolic CHF - 08/07/13 EF 40-45%, grade 2 diastolic dysfunction   Diet: Diet regular Fluids: KVO  Code Status: DNR Family Communication: discussed with daughter bedside  Disposition Plan: anticipate hospital death   Consultants:  None    Procedures:  Pall care     Antibiotics  None    Studies  No results found.  Objective  Filed Vitals:   10/01/14 0900 10/01/14 1355 10/02/14 0544 10/03/14 0540  BP:  94/70 103/71 85/53  Pulse:  97 113 105  Temp:  97.3 F (36.3 C) 96.8 F (36 C) 96.7 F (35.9 C)  TempSrc:  Oral Axillary Axillary  Resp:  Height:  (1.575 m)  Weight: 58.968 kg (130 lb)     SpO2:  100% 100%     Intake/Output Summary (Last 24 hours) at 10/03/14 1118 Last data filed at 10/02/14 1557  Gross per 24 hour  Intake     76 ml  Output      0 ml  Net     76 ml   Filed Weights   2014/10/01 2012 10/01/14 0900  Weight: 58.968 kg (130 lb) 58.968 kg (130 lb)    Exam:  General:  unresponsive  Cardiovascular: regular   Data Reviewed: Basic Metabolic Panel:  Recent Labs Lab Oct 01, 2014 0851   NA 142  K 4.5  CL 110  CO2 14*  GLUCOSE 136*  BUN 136*  CREATININE 5.56*  CALCIUM 8.5   Liver Function Tests:  Recent Labs Lab 2014/10/01 0851  AST 29  ALT 17  ALKPHOS 68  BILITOT 0.6  PROT 7.3  ALBUMIN 2.5*    Recent Labs Lab 10/01/14 0852  AMMONIA 52*   CBC:  Recent Labs Lab 10-01-2014 0851  WBC 5.5  NEUTROABS 3.7  HGB 13.3  HCT 41.5  MCV 98.3  PLT 74*    Recent Results (from the past 240 hour(s))  Culture, blood (routine x 2)     Status: None (Preliminary result)   Collection Time: 10-01-14  8:53 AM  Result Value Ref Range Status   Specimen Description BLOOD LEFT WRIST  Final   Special Requests BOTTLES DRAWN AEROBIC ONLY 3CC  Final   Culture   Final           BLOOD CULTURE RECEIVED NO GROWTH TO DATE CULTURE WILL BE HELD FOR 5 DAYS BEFORE ISSUING A FINAL NEGATIVE REPORT Performed at [facility]    Report Status PENDING  Incomplete  Culture, blood (routine x 2)     Status: None (Preliminary result)   Collection Time: 10-01-14  8:58 AM  Result Value Ref Range Status   Specimen Description BLOOD LEFT HAND  Final   Special Requests BOTTLES DRAWN AEROBIC ONLY 2CC  Final   Culture   Final           BLOOD CULTURE RECEIVED NO GROWTH TO DATE CULTURE WILL BE HELD FOR 5 DAYS BEFORE ISSUING A FINAL NEGATIVE REPORT Performed at [facility]    Report Status PENDING  Incomplete     Scheduled Meds: . aspirin  300 mg Rectal Daily  . metoprolol tartrate  75 mg Oral BID   Continuous Infusions:    Leroy Sea M.D on 10/03/2014 at 11:19 AM  Between 7am to 7pm - Pager - [phone], After 7pm go to [email or web address] - password [email or web address]  And look for the night coverage person covering me after hours  [facility]  Office  [phone]   10/03/2014, 11:18 AM  LOS: 3 days

## 2014-10-04 NOTE — Clinical Social Work Note (Signed)
Spoke to patient's family about going to residential hospice.  Patient's family is interested in going to [facility].  Contacted [facility] who will evaluate and determine if patient is eligible for residential hospice.  Ervin Knack. Franklin Clapsaddle, MSW, LCSWA [phone] 10/04/2014 11:19 AM

## 2014-10-04 NOTE — Consult Note (Signed)
[facility] Liaison:  [facility] room available for patient 10/05/14 if transfer still makes sense. Family completed paper work for transfer this afternoon. Dr. Kern Reap to assume care. Please fax discharge summary to [phone]. RN please call report to [phone]. Please arrange for patient to arrive before noon if possible. Thank you. Forrestine Him LCSW [phone]

## 2014-10-04 NOTE — Progress Notes (Signed)
PROGRESS NOTE  Emily Peters UYE:[id number] DOB: 04/25/1915 DOA: 10/11/14 PCP: Fredderick Severance, MD  HPI: 81 year old female with a history of hypertension, dementia, CKD stage IV, atrial fibrillation, CHF, hypothyroidism presents with one week hx of altered mental status at her SNF, [place]. The patient is presently encephalopathic and is unable to provide any history. All of this history is obtained from speaking with EDP, pt's family at bedside and review of medical record. Apparently, the patient had an episode of emesis on 09/25/2014. She has had a progressive decline in her mental status since that period of time. The patient is unable to verbalize her needs, but the patient's son states that his visits the past week he has also noted decreasing mental status. There've also been reports of diarrhea for the past 1 week without any hematochezia or melena. In the emergency department, the patient was noted to have atrial fibrillation with RVR with a heart rate 110-120. In addition, the patient was noted to have ST elevation in V4-V6 on EKG as well as I and aVL. Dr. Rennis Golden was consulted and felt pt was not a candidate to go to the cardiac cath lab, and he started heparin and increased metoprolol. Her point-of-care troponin was 12.33. She was also found to be in worsening renal failure with a serum creatinine of 5.56. Chest x-ray suggested a left lower lobe opacity. The patient was placed on a nonrebreather due to hypoxemia. Her oxygen saturation was in the low 90s after a nonrebreather was placed. She was given vancomycin, cefepime, and metronidazole and given 2 fluid boluses.  Subjective / 24 H Interval events  Unresponsive this morning, shallow breathing. Appears comfortable.  Assessment/Plan:   Goals of Care - Dr. Arbutus Leas on admission as well as Dr. Elvera Lennox discussed the patient's critical medical condition with her family at bedside, she has multiple underlying medical problems and per family's  whishes, after reviewing benefits, risks and alternatives, the family feels that transitioning patient's focus of care to total comfort was in the pt's best interest - morphine IV q 1hr prn anxiety and pain and sob,  ativan IV q 1 hr prn anxiety, Pall care called. She is day 4 of hospital stay and may be here for the next several more days. Will at least try to arrange for inpatient hospice if she survives beyond today.    All other medical issues below. Only supportive care now she is full comfort care. Family bedside. Discussed in detail with son and daughter-in-law.   STEMI - transitioning to focus of care to total comfort, not a candidate for aggressive intervensions  Acute on chronic renal failure (CKD 4) - transitioning to focus of care to total comfort  Acute respiratory Failure - transitioning to focus of care to total comfort  Atrial fibrillation with RVR - transitioning to focus of care to total comfort  Acute Encephalopathy - multifactorial,  transitioning to focus of care to total comfort  Dementia - transitioning to focus of care to total comfort  Chronic systolic and diastolic CHF - 08/07/13 EF 40-45%, grade 2 diastolic dysfunction     Code Status: DNR Family Communication: discussed with son and daughter in law bedside  Disposition Plan: Patient hospice if survives beyond today.     Consultants:  None    Procedures:  Pall care     Antibiotics  None    Studies  No results found.  Objective  Filed Vitals:   10/01/14 1355 10/02/14 0544 10/03/14 0540 10/04/14 0424  BP: 94/70 103/71 85/53 116/75  Pulse: 97 113 105 63  Temp: 97.3 F (36.3 C) 96.8 F (36 C) 96.7 F (35.9 C) 97.5 F (36.4 C)  TempSrc: Oral Axillary Axillary Axillary  Resp: Height:      Weight:      SpO2: 100% 100%  98%    Intake/Output Summary (Last 24 hours) at 10/04/14 3664 Last data filed at 10/03/14 1559  Gross per 24 hour  Intake      0 ml  Output      0  ml  Net      0 ml   Filed Weights   Oct 07, 2014 2012 10/01/14 0900  Weight: 58.968 kg (130 lb) 58.968 kg (130 lb)    Exam:  General:  unresponsive  Cardiovascular: regular   Data Reviewed: Basic Metabolic Panel:  Recent Labs Lab 10-07-2014 0851  NA 142  K 4.5  CL 110  CO2 14*  GLUCOSE 136*  BUN 136*  CREATININE 5.56*  CALCIUM 8.5   Liver Function Tests:  Recent Labs Lab 2014/10/07 0851  AST 29  ALT 17  ALKPHOS 68  BILITOT 0.6  PROT 7.3  ALBUMIN 2.5*    Recent Labs Lab 2014-10-07 0852  AMMONIA 52*   CBC:  Recent Labs Lab Oct 07, 2014 0851  WBC 5.5  NEUTROABS 3.7  HGB 13.3  HCT 41.5  MCV 98.3  PLT 74*    Recent Results (from the past 240 hour(s))  Culture, blood (routine x 2)     Status: None (Preliminary result)   Collection Time: 10-07-2014  8:53 AM  Result Value Ref Range Status   Specimen Description BLOOD LEFT WRIST  Final   Special Requests BOTTLES DRAWN AEROBIC ONLY 3CC  Final   Culture   Final           BLOOD CULTURE RECEIVED NO GROWTH TO DATE CULTURE WILL BE HELD FOR 5 DAYS BEFORE ISSUING A FINAL NEGATIVE REPORT Performed at [facility]    Report Status PENDING  Incomplete  Culture, blood (routine x 2)     Status: None (Preliminary result)   Collection Time: Oct 07, 2014  8:58 AM  Result Value Ref Range Status   Specimen Description BLOOD LEFT HAND  Final   Special Requests BOTTLES DRAWN AEROBIC ONLY 2CC  Final   Culture   Final           BLOOD CULTURE RECEIVED NO GROWTH TO DATE CULTURE WILL BE HELD FOR 5 DAYS BEFORE ISSUING A FINAL NEGATIVE REPORT Performed at [facility]    Report Status PENDING  Incomplete     Scheduled Meds: . aspirin  300 mg Rectal Daily  . metoprolol tartrate  75 mg Oral BID   Continuous Infusions:    Leroy Sea M.D on 10/04/2014 at 9:28 AM  Between 7am to 7pm - Pager - [phone], After 7pm go to [email or web address] - password [email or web address]  And look for the night coverage person covering me after  hours  [facility]  Office  [phone]   10/04/2014, 9:28 AM  LOS: 4 days

## 2014-10-04 NOTE — Care Management Note (Signed)
  Page 1 of 1   10/04/2014     12:42:48 PM CARE MANAGEMENT NOTE 10/04/2014  Patient:  Emily Peters, Emily Peters   Account Number:  [id number]  Date Initiated:  10/02/2014  Documentation initiated by:  Ronny Flurry  Subjective/Objective Assessment:     Action/Plan:   Anticipated DC Date:  10/05/2014   Anticipated DC Plan:  [facility] MEDICAL FACILITY  In-house referral  Clinical Social Worker  Hospice / Palliative Care      DC Planning Services  CM consult      Choice offered to / List presented to:             Status of service:  Completed, signed off Medicare Important Message given?  YES (If response is "NO", the following Medicare IM given date fields will be blank) Date Medicare IM given:  10/02/2014 Medicare IM given by:  Ronny Flurry Date Additional Medicare IM given:  10/04/2014 Additional Medicare IM given by:  Ronny Flurry  Discharge Disposition:  [facility] MEDICAL FACILITY  Per UR Regulation:  Reviewed for med. necessity/level of care/duration of stay  If discussed at Long Length of Stay Meetings, dates discussed:    Comments:

## 2014-10-04 NOTE — Clinical Social Work Psychosocial (Signed)
Clinical Social Work Department BRIEF PSYCHOSOCIAL ASSESSMENT 10/04/2014  Patient:  Emily Peters, Emily Peters     Account Number:  [id number]     Admit date:  10-08-2014  Clinical Social Worker:  Elouise Munroe  Date/Time:  10/04/2014 03:04 PM  Referred by:  Physician  Date Referred:  10/04/2014 Referred for  Residential hospice placement   Other Referral:   Interview type:  Family Other interview type:    PSYCHOSOCIAL DATA Living Status:  FACILITY Admitted from facility:  [facility] Level of care:  Skilled Nursing Facility Primary support name:  [name] Samano Primary support relationship to patient:  FAMILY Degree of support available:   Patient has been living at [facility].    CURRENT CONCERNS Current Concerns  Other - See comment   Other Concerns:   Hospice residential placement.    SOCIAL WORK ASSESSMENT / PLAN Patient is a 81 year old female who has been seen by palliative care in the hospital.  Patient has deterioriated and will be going to residential hospice at [facility] if she is stable enough.  Patient plan is to discharge to [facility] once orders have been received.   Assessment/plan status:  Psychosocial Support/Ongoing Assessment of Needs Other assessment/ plan:   Information/referral to community resources:    PATIENT'S/FAMILY'S RESPONSE TO PLAN OF CARE: Patient's family is in an agreement to go to residential hospice at [facility].    Ervin Knack. Kerstin Crusoe, MSW, Theresia Majors [phone] 10/04/2014 3:10 PM

## 2014-10-05 NOTE — Consult Note (Signed)
[facility] Liaison: [facility] referral center received call from hospital SW indicating patient would not be transferring today. Followed up with SW who checked with MD and sent text message indicating MD felt patient was actively dying and to "go ahead and move on." Text message acknowledged at 11:38. Thank you. Forrestine Him LCSW [phone]

## 2014-10-05 NOTE — Progress Notes (Signed)
PROGRESS NOTE  Emily Peters HCW:[id number] DOB: [birth year] DOA: 09/29/2014 PCP: Fredderick Severance, MD  HPI: 81 year old female with a history of hypertension, dementia, CKD stage IV, atrial fibrillation, CHF, hypothyroidism presents with one week hx of altered mental status at her SNF, [facility]. The patient is presently encephalopathic and is unable to provide any history. All of this history is obtained from speaking with EDP, pt's family at bedside and review of medical record. Apparently, the patient had an episode of emesis on 09/25/2014. She has had a progressive decline in her mental status since that period of time. The patient is unable to verbalize her needs, but the patient's son states that his visits the past week he has also noted decreasing mental status. There've also been reports of diarrhea for the past 1 week without any hematochezia or melena. In the emergency department, the patient was noted to have atrial fibrillation with RVR with a heart rate 110-120. In addition, the patient was noted to have ST elevation in V4-V6 on EKG as well as I and aVL. Dr. Rennis Golden was consulted and felt pt was not a candidate to go to the cardiac cath lab, and he started heparin and increased metoprolol. Her point-of-care troponin was 12.33. She was also found to be in worsening renal failure with a serum creatinine of 5.56. Chest x-ray suggested a left lower lobe opacity. The patient was placed on a nonrebreather due to hypoxemia. Her oxygen saturation was in the low 90s after a nonrebreather was placed. She was given vancomycin, cefepime, and metronidazole and given 2 fluid boluses.  Subjective / 24 H Interval events  Unresponsive this morning, shallow breathing. Appears comfortable.  Assessment/Plan:   Goals of Care - Dr. Arbutus Leas on admission as well as Dr. Elvera Lennox discussed the patient's critical medical condition with her family at bedside, she has multiple underlying medical problems and per family's  whishes, after reviewing benefits, risks and alternatives, the family feels that transitioning patient's focus of care to total comfort was in the pt's best interest - morphine IV q 1hr prn anxiety and pain and sob,  ativan IV q 1 hr prn anxiety, Pall care called. If she survives beyond today will be transferred to inpatient hospice on 10/13/14. Today she looks closer to passing away than on previous days.    All other medical issues below. Only supportive care now she is full comfort care. Family bedside. Discussed in detail with son and daughter-in-law.   STEMI - transitioning to focus of care to total comfort, not a candidate for aggressive intervensions  Acute on chronic renal failure (CKD 4) - transitioning to focus of care to total comfort  Acute respiratory Failure - transitioning to focus of care to total comfort  Atrial fibrillation with RVR - transitioning to focus of care to total comfort  Acute Encephalopathy - multifactorial,  transitioning to focus of care to total comfort  Dementia - transitioning to focus of care to total comfort  Chronic systolic and diastolic CHF - 08/07/13 EF 40-45%, grade 2 diastolic dysfunction     Code Status: DNR Family Communication: discussed with son and daughter in law bedside  Disposition Plan: Death now looks imminent. We'll keep her in the hospital today. Explained to the family that if she does not pass away today will be sent to inpatient hospice on 2014/10/13   Consultants:  None    Procedures:  Pall care     Antibiotics  None    Studies  No results  found.  Objective  Filed Vitals:   10/03/14 0540 10/04/14 0424 10/04/14 1433 10/05/14 0534  BP: 85/53 116/75 117/62 126/61  Pulse: 105 63 143 124  Temp: 96.7 F (35.9 C) 97.5 F (36.4 C) 96.8 F (36 C) 100 F (37.8 C)  TempSrc: Axillary Axillary Axillary Axillary  Resp: Height:      Weight:      SpO2:  98%  100%   No intake or output data in  the 24 hours ending 10/05/14 1036 Filed Weights   23-Oct-2014 2012 10/01/14 0900  Weight: 58.968 kg (130 lb) 58.968 kg (130 lb)    Exam:  General:  unresponsive  Cardiovascular: regular   Data Reviewed: Basic Metabolic Panel:  Recent Labs Lab 10/23/14 0851  NA 142  K 4.5  CL 110  CO2 14*  GLUCOSE 136*  BUN 136*  CREATININE 5.56*  CALCIUM 8.5   Liver Function Tests:  Recent Labs Lab 2014-10-23 0851  AST 29  ALT 17  ALKPHOS 68  BILITOT 0.6  PROT 7.3  ALBUMIN 2.5*    Recent Labs Lab 10-23-14 0852  AMMONIA 52*   CBC:  Recent Labs Lab 10/23/14 0851  WBC 5.5  NEUTROABS 3.7  HGB 13.3  HCT 41.5  MCV 98.3  PLT 74*    Recent Results (from the past 240 hour(s))  Culture, blood (routine x 2)     Status: None (Preliminary result)   Collection Time: 2014/10/23  8:53 AM  Result Value Ref Range Status   Specimen Description BLOOD LEFT WRIST  Final   Special Requests BOTTLES DRAWN AEROBIC ONLY 3CC  Final   Culture   Final           BLOOD CULTURE RECEIVED NO GROWTH TO DATE CULTURE WILL BE HELD FOR 5 DAYS BEFORE ISSUING A FINAL NEGATIVE REPORT Performed at [facility]    Report Status PENDING  Incomplete  Culture, blood (routine x 2)     Status: None (Preliminary result)   Collection Time: 2014-10-23  8:58 AM  Result Value Ref Range Status   Specimen Description BLOOD LEFT HAND  Final   Special Requests BOTTLES DRAWN AEROBIC ONLY 2CC  Final   Culture   Final           BLOOD CULTURE RECEIVED NO GROWTH TO DATE CULTURE WILL BE HELD FOR 5 DAYS BEFORE ISSUING A FINAL NEGATIVE REPORT Performed at [facility]    Report Status PENDING  Incomplete     Scheduled Meds: . aspirin  300 mg Rectal Daily  . metoprolol tartrate  75 mg Oral BID   Continuous Infusions:    Leroy Sea M.D on 10/05/2014 at 10:36 AM  Between 7am to 7pm - Pager - [phone], After 7pm go to [email or web address] - password [email or web address]  And look for the night coverage person covering  me after hours  [facility]  Office  [phone]   10/05/2014, 10:36 AM  LOS: 5 days

## 2014-10-05 NOTE — Clinical Social Work Note (Addendum)
12:00pm CSW spoke with Carley Hammed from [facility]- bed can not be held for tomorrow- will need to re-refer patient if still living.  Dr reports that patient is likely to pass before tomorrow.  10:45am CSW spoke with [facility] to inform that patient will not be DCing from hospital today- representative unsure if bed can be held for tomorrow if patient is able to transfer then- will give information to [employer] who will follow up with CSW.  CSW will continue to follow.  Merlyn Lot, LCSWA Clinical Social Worker [phone]

## 2014-10-06 LAB — CULTURE, BLOOD (ROUTINE X 2)
CULTURE: NO GROWTH
Culture: NO GROWTH

## 2014-10-06 MED ORDER — LORAZEPAM 2 MG/ML PO CONC: 1.0000 mg | Freq: Four times a day (QID) | ORAL | Status: AC | PRN

## 2014-10-06 MED ORDER — MORPHINE SULFATE (CONCENTRATE) 10 MG/0.5ML PO SOLN: 10.0000 mg | ORAL | Status: AC | PRN

## 2014-10-22 NOTE — Progress Notes (Signed)
Family in for their last visit. Offered emotional support to the family. Family very thankful about the care given to the patient.

## 2014-10-22 NOTE — Progress Notes (Signed)
PROGRESS NOTE  Emily Peters HCW:[id number] DOB: [birth year] DOA: 09/29/2014 PCP: Fredderick Severance, MD  HPI: 81 year old female with a history of hypertension, dementia, CKD stage IV, atrial fibrillation, CHF, hypothyroidism presents with one week hx of altered mental status at her SNF, [facility]. The patient is presently encephalopathic and is unable to provide any history. All of this history is obtained from speaking with EDP, pt's family at bedside and review of medical record. Apparently, the patient had an episode of emesis on 09/25/2014. She has had a progressive decline in her mental status since that period of time. The patient is unable to verbalize her needs, but the patient's son states that his visits the past week he has also noted decreasing mental status. There've also been reports of diarrhea for the past 1 week without any hematochezia or melena. In the emergency department, the patient was noted to have atrial fibrillation with RVR with a heart rate 110-120. In addition, the patient was noted to have ST elevation in V4-V6 on EKG as well as I and aVL. Dr. Rennis Golden was consulted and felt pt was not a candidate to go to the cardiac cath lab, and he started heparin and increased metoprolol. Her point-of-care troponin was 12.33. She was also found to be in worsening renal failure with a serum creatinine of 5.56. Chest x-ray suggested a left lower lobe opacity. The patient was placed on a nonrebreather due to hypoxemia. Her oxygen saturation was in the low 90s after a nonrebreather was placed. She was given vancomycin, cefepime, and metronidazole and given 2 fluid boluses.  Subjective / 24 H Interval events  Unresponsive this morning, shallow breathing. Appears comfortable.  Assessment/Plan:   Goals of Care - Dr. Arbutus Leas on admission as well as Dr. Elvera Lennox discussed the patient's critical medical condition with her family at bedside, she has multiple underlying medical problems and per family's  whishes, after reviewing benefits, risks and alternatives, the family feels that transitioning patient's focus of care to total comfort was in the pt's best interest - morphine IV q 1hr prn anxiety and pain and sob,  ativan IV q 1 hr prn anxiety, Pall care called. If she survives beyond today will be transferred to inpatient hospice on 10/13/14. Today she looks closer to passing away than on previous days.    All other medical issues below. Only supportive care now she is full comfort care. Family bedside. Discussed in detail with son and daughter-in-law.   STEMI - transitioning to focus of care to total comfort, not a candidate for aggressive intervensions  Acute on chronic renal failure (CKD 4) - transitioning to focus of care to total comfort  Acute respiratory Failure - transitioning to focus of care to total comfort  Atrial fibrillation with RVR - transitioning to focus of care to total comfort  Acute Encephalopathy - multifactorial,  transitioning to focus of care to total comfort  Dementia - transitioning to focus of care to total comfort  Chronic systolic and diastolic CHF - 08/07/13 EF 40-45%, grade 2 diastolic dysfunction     Code Status: DNR Family Communication: discussed with son and daughter in law bedside  Disposition Plan: Death now looks imminent. We'll keep her in the hospital today. Explained to the family that if she does not pass away today will be sent to inpatient hospice on 2014/10/13   Consultants:  None    Procedures:  Pall care     Antibiotics  None    Studies  No results  found.  Objective  Filed Vitals:   10/04/14 0424 10/04/14 1433 10/05/14 0534 10/16/2014 0458  BP: 116/75 117/62 126/61 89/58  Pulse: 63 143 124 148  Temp: 97.5 F (36.4 C) 96.8 F (36 C) 100 F (37.8 C) 98.8 F (37.1 C)  TempSrc: Axillary Axillary Axillary Axillary  Resp: 15 15 28 28   Height:      Weight:      SpO2: 98%  100% 92%    Intake/Output Summary  (Last 24 hours) at 10/16/2014 1128 Last data filed at 10/18/2014 0900  Gross per 24 hour  Intake      0 ml  Output      0 ml  Net      0 ml   Filed Weights   Oct 23, 2014 2012 10/01/14 0900  Weight: 58.968 kg (130 lb) 58.968 kg (130 lb)    Exam:  General:  unresponsive  Cardiovascular: regular   Data Reviewed: Basic Metabolic Panel:  Recent Labs Lab 23-Oct-2014 0851  NA 142  K 4.5  CL 110  CO2 14*  GLUCOSE 136*  BUN 136*  CREATININE 5.56*  CALCIUM 8.5   Liver Function Tests:  Recent Labs Lab 10-23-14 0851  AST 29  ALT 17  ALKPHOS 68  BILITOT 0.6  PROT 7.3  ALBUMIN 2.5*    Recent Labs Lab 23-Oct-2014 0852  AMMONIA 52*   CBC:  Recent Labs Lab 10/23/14 0851  WBC 5.5  NEUTROABS 3.7  HGB 13.3  HCT 41.5  MCV 98.3  PLT 74*    Recent Results (from the past 240 hour(s))  Culture, blood (routine x 2)     Status: None   Collection Time: 10-23-14  8:53 AM  Result Value Ref Range Status   Specimen Description BLOOD LEFT WRIST  Final   Special Requests BOTTLES DRAWN AEROBIC ONLY 3CC  Final   Culture   Final    NO GROWTH 5 DAYS Performed at [facility]    Report Status 10/12/2014 FINAL  Final  Culture, blood (routine x 2)     Status: None   Collection Time: 10-23-2014  8:58 AM  Result Value Ref Range Status   Specimen Description BLOOD LEFT HAND  Final   Special Requests BOTTLES DRAWN AEROBIC ONLY 2CC  Final   Culture   Final    NO GROWTH 5 DAYS Note: Culture results may be compromised due to an inadequate volume of blood received in culture bottles. Performed at [facility]    Report Status 09/25/2014 FINAL  Final     Scheduled Meds: . aspirin  300 mg Rectal Daily  . metoprolol tartrate  75 mg Oral BID   Continuous Infusions:    Leroy Sea M.D on 10/11/2014 at 11:28 AM  Between 7am to 7pm - Pager - [phone], After 7pm go to [email or web address] - password [email or web address]  And look for the night coverage person covering me after  hours  [facility]  Office  [phone]   10/21/2014, 11:28 AM  LOS: 6 days

## 2014-10-22 NOTE — Progress Notes (Signed)
Placed a call to the social worker to check bed availability in [facility] per MD .

## 2014-10-22 NOTE — Progress Notes (Addendum)
Noted patient stop breathing, no heart rate , no pulse appreciated, no BP. Assessment verified by another licensed nurse. Md made aware of the death. Family made aware of the death. Pronounced at 1805.

## 2014-10-22 NOTE — Discharge Summary (Addendum)
[facility] Death Note                                                                                          Death Note please see Last Note for all details.   In breif -    Emily Peters CSN:[id number],MRN:[id number] is a 81 y.o. female, Outpatient Primary MD for the patient is Fredderick Severance, MD  Pronounced dead by RN on   10-14-2014 @  18.05                Cause of death STEMI and CHF was full comfort care.   Leroy Sea M.D on October 14, 2014 at 6:08 PM  [facility] Office Phone [phone]  Total clinical and documentation time for today - 30 minutes   Last Note    PROGRESS NOTE  Emily Peters UJW:[id number] DOB: 04-29-1915 DOA: 09/22/2014 PCP: Fredderick Severance, MD  HPI: 81 year old female with a history of hypertension, dementia, CKD stage IV, atrial fibrillation, CHF, hypothyroidism presents with one week hx of altered mental status at her SNF, [facility]. The patient is presently encephalopathic and is unable to provide any history. All of this history is obtained from speaking with EDP, pt's family at bedside and review of medical record. Apparently, the patient had an episode of emesis on 09/25/2014. She has had a progressive decline in her mental status since that period of time. The patient is unable to verbalize her needs, but the patient's son states that his visits the past week he has also noted decreasing mental status. There've also been reports of diarrhea for the past 1 week without any hematochezia or melena. In the emergency department, the patient was noted to have atrial fibrillation with RVR with a heart rate 110-120. In addition, the patient was noted to have ST elevation in V4-V6 on EKG as well as I and aVL. Dr. Rennis Golden was consulted and felt pt was not a candidate to go to the cardiac cath lab, and he started heparin and increased metoprolol. Her point-of-care troponin was 12.33. She was  also found to be in worsening renal failure with a serum creatinine of 5.56. Chest x-ray suggested a left lower lobe opacity. The patient was placed on a nonrebreather due to hypoxemia. Her oxygen saturation was in the low 90s after a nonrebreather was placed. She was given vancomycin, cefepime, and metronidazole and given 2 fluid boluses.  Subjective / 24 H Interval events  Unresponsive this morning, shallow breathing. Appears comfortable.  Assessment/Plan:   Goals of Care - Dr. Arbutus Leas on admission as well as Dr. Elvera Lennox discussed the patient's critical medical condition with her family at bedside, she has multiple underlying medical problems and per family's whishes, after reviewing benefits, risks and alternatives, the family feels that transitioning patient's focus of care to total comfort was in the pt's best interest - morphine IV q 1hr prn anxiety and  pain and sob,  ativan IV q 1 hr prn anxiety, Pall care called. If she survives beyond today will be transferred to inpatient hospice on 10/04/2014. Today she looks closer to passing away than on previous days.    All other medical issues below. Only supportive care now she is full comfort care. Family bedside. Discussed in detail with son and daughter-in-law.   STEMI - transitioning to focus of care to total comfort, not a candidate for aggressive intervensions  Acute on chronic renal failure (CKD 4) - transitioning to focus of care to total comfort  Acute respiratory Failure - transitioning to focus of care to total comfort  Atrial fibrillation with RVR - transitioning to focus of care to total comfort  Acute Encephalopathy - multifactorial,  transitioning to focus of care to total comfort  Dementia - transitioning to focus of care to total comfort  Chronic systolic and diastolic CHF - 08/07/13 EF 40-45%, grade 2 diastolic dysfunction     Code Status: DNR Family Communication: discussed with son and daughter in law bedside    Disposition Plan: Death now looks imminent. We'll keep her in the hospital today. Explained to the family that if she does not pass away today will be sent to inpatient hospice on 09/22/2014   Consultants:  None    Procedures:  Pall care     Antibiotics  None    Studies  No results found.  Objective  Filed Vitals:   10/04/14 0424 10/04/14 1433 10/05/14 0534 09/29/2014 0458  BP: 116/75 117/62 126/61 89/58  Pulse: 63 143 124 148  Temp: 97.5 F (36.4 C) 96.8 F (36 C) 100 F (37.8 C) 98.8 F (37.1 C)  TempSrc: Axillary Axillary Axillary Axillary  Resp: 15 15 28 28   Height:      Weight:      SpO2: 98%  100% 92%    Intake/Output Summary (Last 24 hours) at 10/15/2014 1808 Last data filed at 09/27/2014 1330  Gross per 24 hour  Intake      0 ml  Output      0 ml  Net      0 ml   Filed Weights   26-Oct-2014 2012 10/01/14 0900  Weight: 58.968 kg (130 lb) 58.968 kg (130 lb)    Exam:  General:  unresponsive  Cardiovascular: regular   Data Reviewed: Basic Metabolic Panel:  Recent Labs Lab 26-Oct-2014 0851  NA 142  K 4.5  CL 110  CO2 14*  GLUCOSE 136*  BUN 136*  CREATININE 5.56*  CALCIUM 8.5   Liver Function Tests:  Recent Labs Lab 2014/10/26 0851  AST 29  ALT 17  ALKPHOS 68  BILITOT 0.6  PROT 7.3  ALBUMIN 2.5*    Recent Labs Lab 2014/10/26 0852  AMMONIA 52*   CBC:  Recent Labs Lab 10-26-2014 0851  WBC 5.5  NEUTROABS 3.7  HGB 13.3  HCT 41.5  MCV 98.3  PLT 74*    Recent Results (from the past 240 hour(s))  Culture, blood (routine x 2)     Status: None   Collection Time: 10/26/14  8:53 AM  Result Value Ref Range Status   Specimen Description BLOOD LEFT WRIST  Final   Special Requests BOTTLES DRAWN AEROBIC ONLY 3CC  Final   Culture   Final    NO GROWTH 5 DAYS Performed at [facility]    Report Status 10/04/2014 FINAL  Final  Culture, blood (routine x 2)     Status: None  Collection Time: 2014-10-16  8:58 AM  Result Value Ref  Range Status   Specimen Description BLOOD LEFT HAND  Final   Special Requests BOTTLES DRAWN AEROBIC ONLY 2CC  Final   Culture   Final    NO GROWTH 5 DAYS Note: Culture results may be compromised due to an inadequate volume of blood received in culture bottles. Performed at [facility]    Report Status 10/05/2014 FINAL  Final     Scheduled Meds: . aspirin  300 mg Rectal Daily  . metoprolol tartrate  75 mg Oral BID   Continuous Infusions:    Leroy Sea M.D on 10/04/2014 at 6:08 PM  Between 7am to 7pm - Pager - [phone], After 7pm go to [email or web address] - password [email or web address]  And look for the night coverage person covering me after hours  [facility]  Office  [phone]   10/14/2014, 6:08 PM  LOS: 6 days

## 2014-10-22 DEATH — deceased

## 2015-02-22 IMAGING — CT CT HEAD W/O CM
2 series · 16 of 30 positions shown, 20 images · non-contrast
Comparison: CT brain 06/02/2012

CLINICAL DATA: Altered mental status for 1 week. History of
dimension.

EXAM:
CT HEAD WITHOUT CONTRAST
TECHNIQUE: Contiguous axial images were obtained from the base of the skull
through the vertex without intravenous contrast.

[Series 201: head w/o, idose (1) · axial · non-contrast · 0.49mm/px · z∈[+32,+152]mm · 13 of 28 slices shown, 17 images]
[im 2/28  brain]
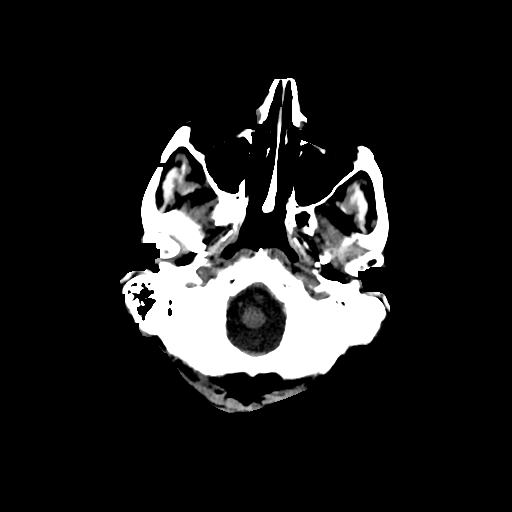
[im 2/28  bone]
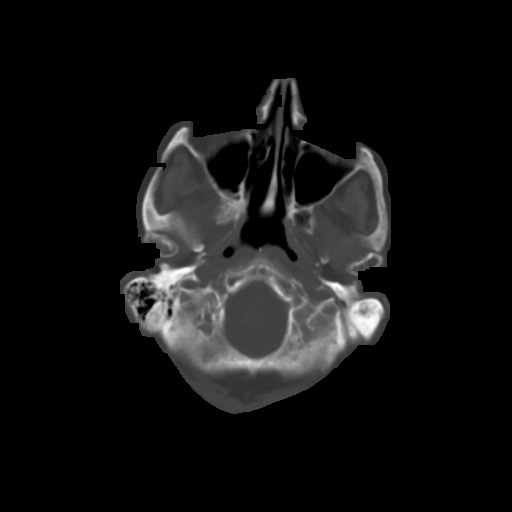
[im 4/28  brain]
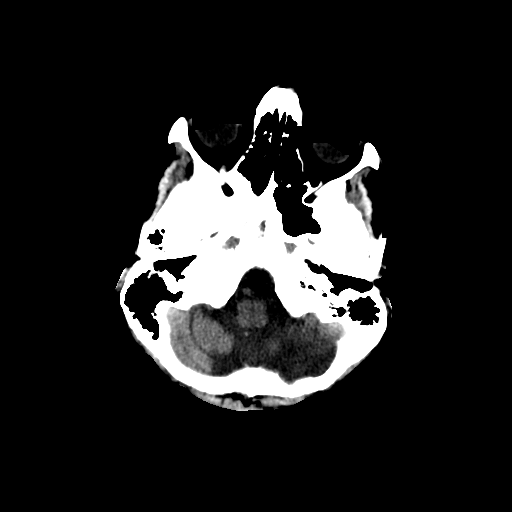
[im 6/28  brain]
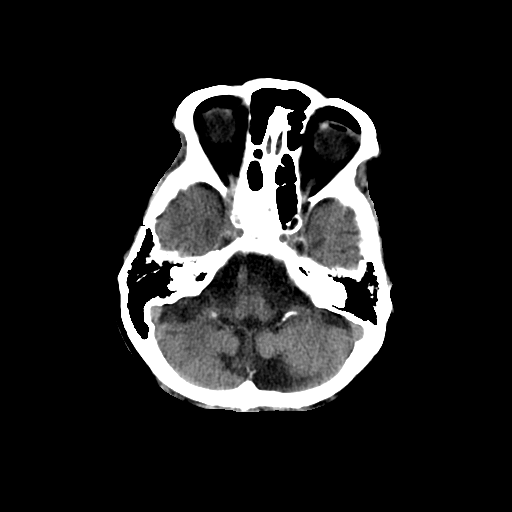
[im 8/28  brain]
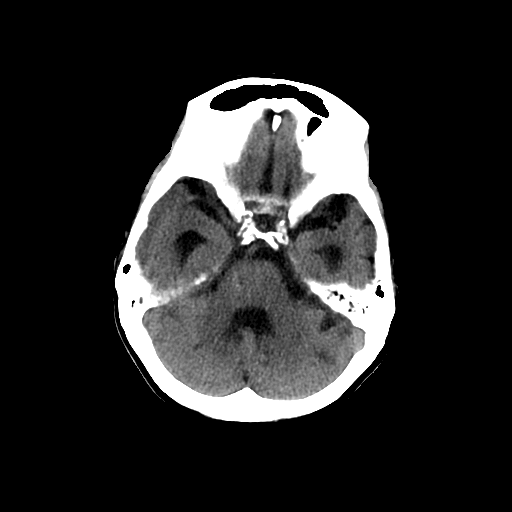
[im 10/28  brain]
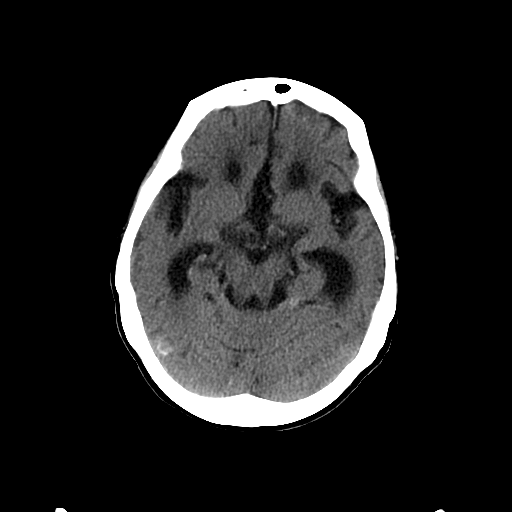
[im 10/28  bone]
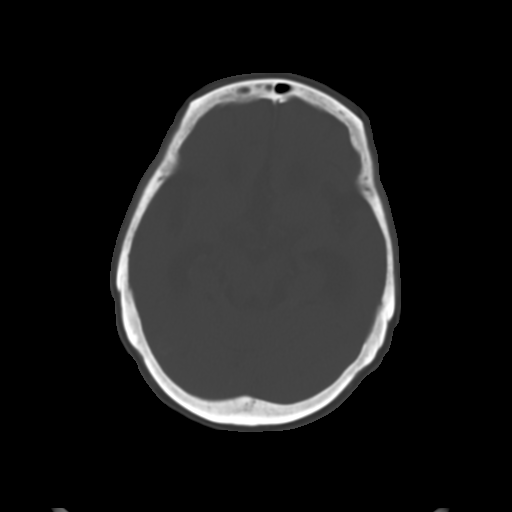
[im 12/28  brain]
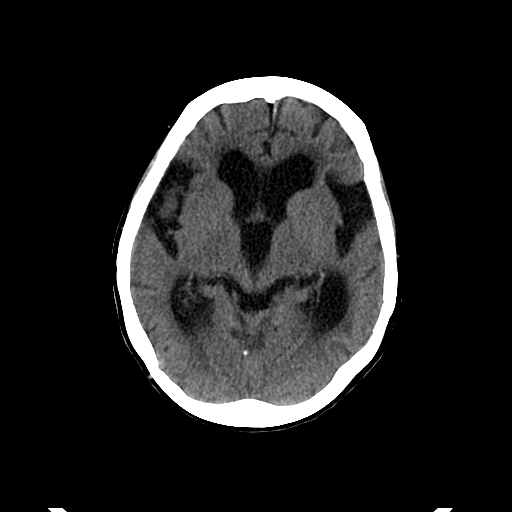
[im 14/28  brain]
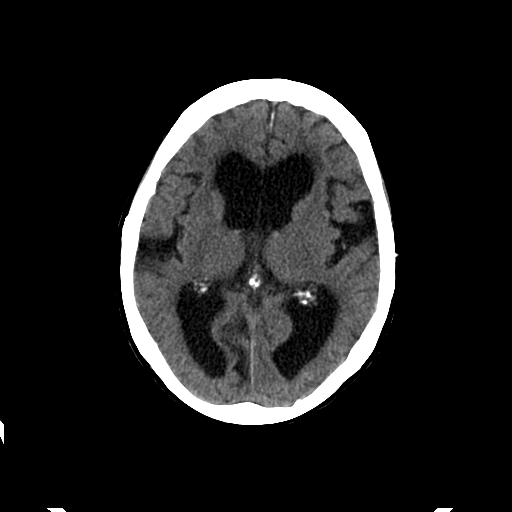
[im 16/28  brain]
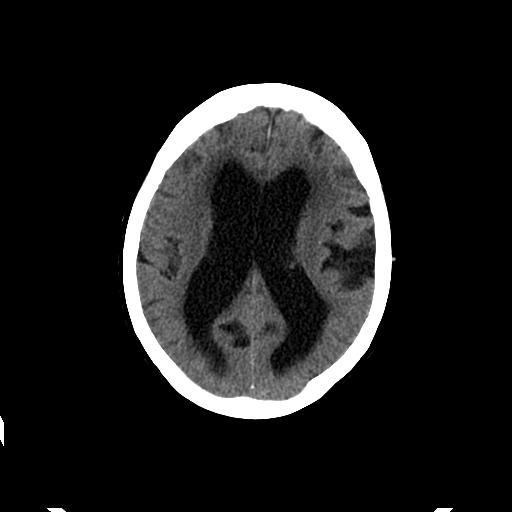
[im 18/28  brain]
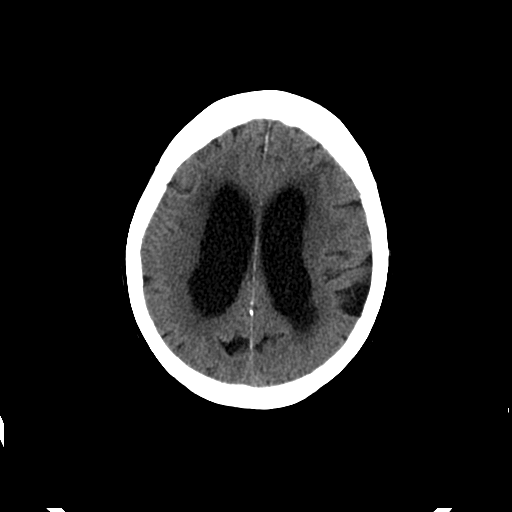
[im 18/28  bone]
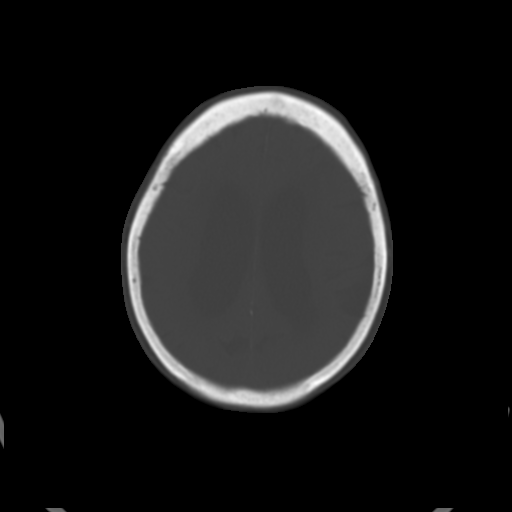
[im 20/28  brain]
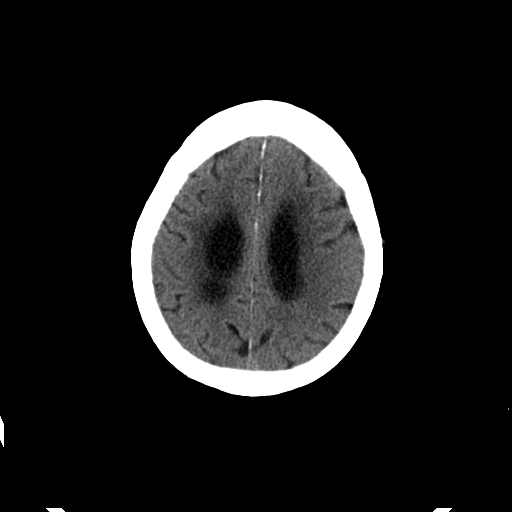
[im 22/28  brain]
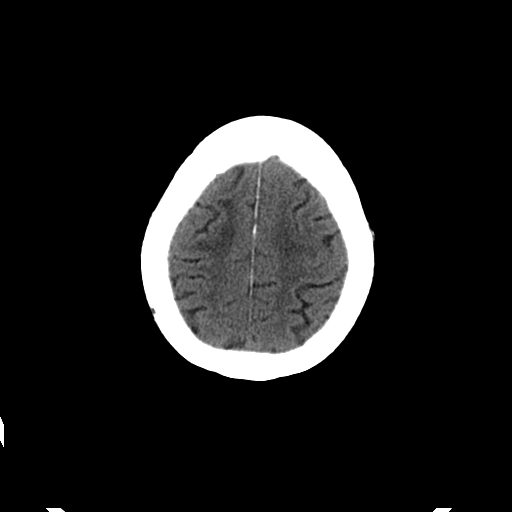
[im 24/28  brain]
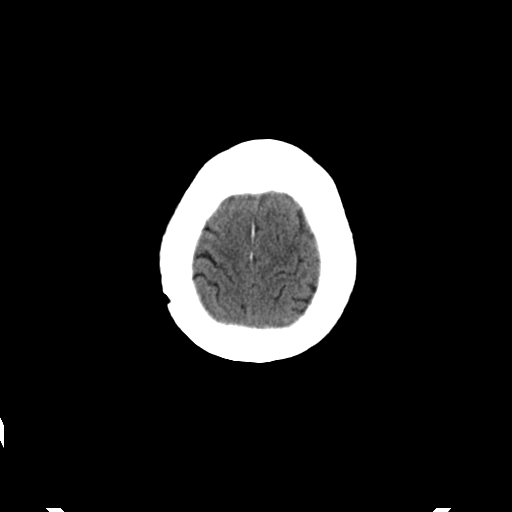
[im 26/28  brain]
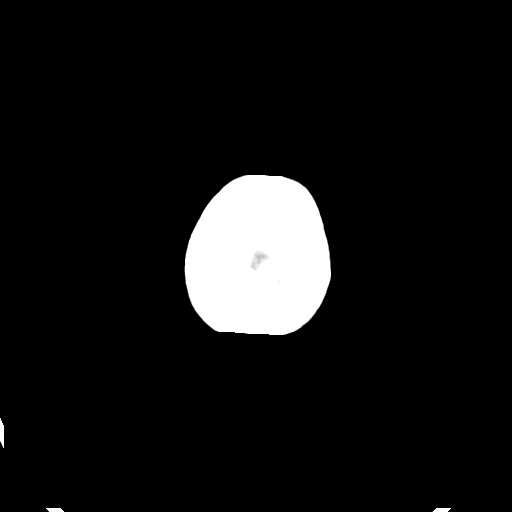
[im 26/28  bone]
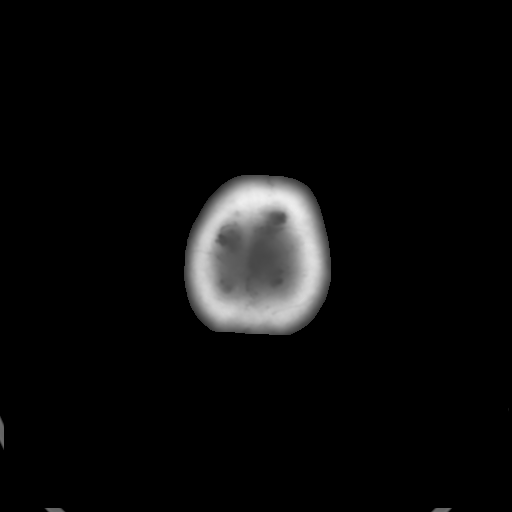

[Series 202: head w/o bone, idose (1) · axial · non-contrast · 0.49mm/px · z∈[+32,+72]mm · 3 of 28 slices shown]
[im 2/28  bone]
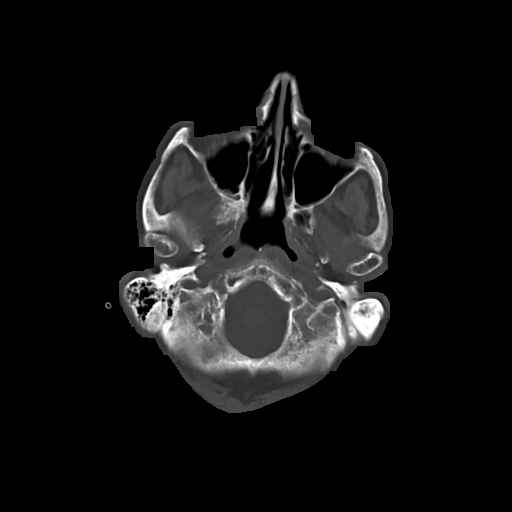
[im 6/28  bone]
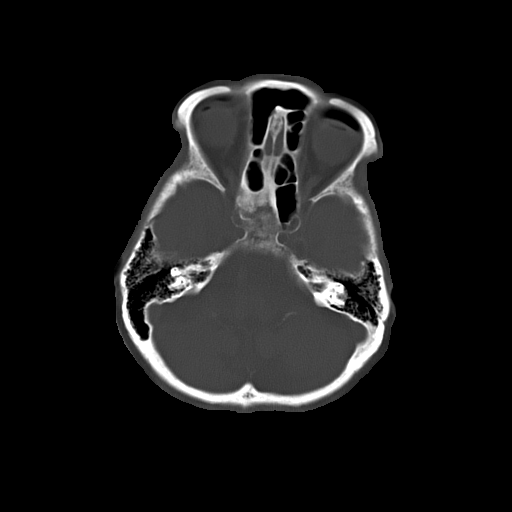
[im 10/28  bone]
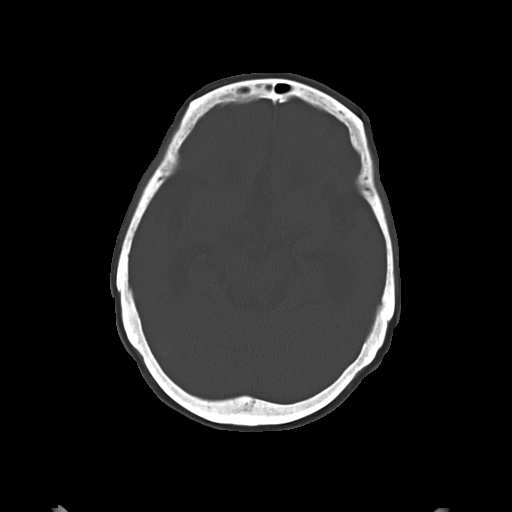

[16 of 30 positions shown; findings below may reference images not displayed]

FINDINGS: The ventricular system is prominent, unchanged from prior.
Periventricular and subcortical white matter hypodensity compatible
with chronic small vessel ischemic change. No evidence for acute
cortically based infarct or intracranial hemorrhage. No significant
interval change partially calcified meningioma adjacent to the right
transverse sinus. Orbits are unremarkable. Stable findings
suggestive of chronic sphenoid sinusitis. Mastoid air cells are
unremarkable. Calvarium is intact.
IMPRESSION: No acute intracranial process.

Generalized cortical atrophy and ventricular dilation, stable.
